# Patient Record
Sex: Female | Born: 1970 | Race: Black or African American | Hispanic: No | Marital: Single | Smoking: Current every day smoker
Health system: Southern US, Community
[De-identification: ages and names within clinical notes are randomized; demographics above are authoritative.]

## PROBLEM LIST (undated history)

## (undated) DIAGNOSIS — F419 Anxiety disorder, unspecified: Secondary | ICD-10-CM

## (undated) DIAGNOSIS — K759 Inflammatory liver disease, unspecified: Secondary | ICD-10-CM

## (undated) DIAGNOSIS — F32A Depression, unspecified: Secondary | ICD-10-CM

## (undated) DIAGNOSIS — F329 Major depressive disorder, single episode, unspecified: Secondary | ICD-10-CM

## (undated) DIAGNOSIS — I10 Essential (primary) hypertension: Secondary | ICD-10-CM

---

## 2002-07-19 ENCOUNTER — Encounter: Payer: Self-pay | Admitting: Emergency Medicine

## 2002-07-19 ENCOUNTER — Emergency Department (HOSPITAL_COMMUNITY): Admission: EM | Admit: 2002-07-19 | Discharge: 2002-07-19 | Payer: Self-pay | Admitting: Emergency Medicine

## 2003-05-31 ENCOUNTER — Inpatient Hospital Stay (HOSPITAL_COMMUNITY): Admission: AD | Admit: 2003-05-31 | Discharge: 2003-06-02 | Payer: Self-pay | Admitting: Psychiatry

## 2004-01-17 ENCOUNTER — Inpatient Hospital Stay (HOSPITAL_COMMUNITY): Admission: RE | Admit: 2004-01-17 | Discharge: 2004-01-22 | Payer: Self-pay | Admitting: Psychiatry

## 2004-04-20 ENCOUNTER — Inpatient Hospital Stay (HOSPITAL_COMMUNITY): Admission: RE | Admit: 2004-04-20 | Discharge: 2004-04-28 | Payer: Self-pay | Admitting: Psychiatry

## 2004-04-20 ENCOUNTER — Ambulatory Visit: Payer: Self-pay | Admitting: Psychiatry

## 2004-05-25 ENCOUNTER — Emergency Department (HOSPITAL_COMMUNITY): Admission: EM | Admit: 2004-05-25 | Discharge: 2004-05-25 | Payer: Self-pay | Admitting: Emergency Medicine

## 2005-01-07 ENCOUNTER — Ambulatory Visit: Payer: Self-pay | Admitting: Psychiatry

## 2005-01-07 ENCOUNTER — Inpatient Hospital Stay (HOSPITAL_COMMUNITY): Admission: EM | Admit: 2005-01-07 | Discharge: 2005-01-13 | Payer: Self-pay | Admitting: Psychiatry

## 2005-02-26 ENCOUNTER — Inpatient Hospital Stay (HOSPITAL_COMMUNITY): Admission: AD | Admit: 2005-02-26 | Discharge: 2005-03-01 | Payer: Self-pay | Admitting: Psychiatry

## 2005-02-26 ENCOUNTER — Ambulatory Visit: Payer: Self-pay | Admitting: Psychiatry

## 2005-06-12 ENCOUNTER — Emergency Department (HOSPITAL_COMMUNITY): Admission: EM | Admit: 2005-06-12 | Discharge: 2005-06-12 | Payer: Self-pay | Admitting: Emergency Medicine

## 2005-11-20 ENCOUNTER — Emergency Department (HOSPITAL_COMMUNITY): Admission: EM | Admit: 2005-11-20 | Discharge: 2005-11-20 | Payer: Self-pay | Admitting: Emergency Medicine

## 2005-12-27 ENCOUNTER — Encounter: Payer: Self-pay | Admitting: *Deleted

## 2005-12-28 ENCOUNTER — Ambulatory Visit: Payer: Self-pay | Admitting: Psychiatry

## 2005-12-28 ENCOUNTER — Inpatient Hospital Stay (HOSPITAL_COMMUNITY): Admission: EM | Admit: 2005-12-28 | Discharge: 2006-01-02 | Payer: Self-pay | Admitting: Psychiatry

## 2006-01-20 ENCOUNTER — Emergency Department (HOSPITAL_COMMUNITY): Admission: EM | Admit: 2006-01-20 | Discharge: 2006-01-20 | Payer: Self-pay | Admitting: Emergency Medicine

## 2006-03-05 ENCOUNTER — Emergency Department (HOSPITAL_COMMUNITY): Admission: EM | Admit: 2006-03-05 | Discharge: 2006-03-06 | Payer: Self-pay | Admitting: Emergency Medicine

## 2006-12-09 ENCOUNTER — Inpatient Hospital Stay (HOSPITAL_COMMUNITY): Admission: AD | Admit: 2006-12-09 | Discharge: 2006-12-14 | Payer: Self-pay | Admitting: Psychiatry

## 2006-12-09 ENCOUNTER — Ambulatory Visit: Payer: Self-pay | Admitting: Psychiatry

## 2006-12-31 ENCOUNTER — Encounter: Payer: Self-pay | Admitting: Emergency Medicine

## 2007-01-01 ENCOUNTER — Inpatient Hospital Stay (HOSPITAL_COMMUNITY): Admission: AD | Admit: 2007-01-01 | Discharge: 2007-01-05 | Payer: Self-pay | Admitting: Psychiatry

## 2007-07-09 ENCOUNTER — Emergency Department (HOSPITAL_COMMUNITY): Admission: EM | Admit: 2007-07-09 | Discharge: 2007-07-09 | Payer: Self-pay | Admitting: Emergency Medicine

## 2007-07-11 ENCOUNTER — Encounter: Payer: Self-pay | Admitting: Emergency Medicine

## 2007-07-11 ENCOUNTER — Ambulatory Visit: Payer: Self-pay | Admitting: Psychiatry

## 2007-07-11 ENCOUNTER — Inpatient Hospital Stay (HOSPITAL_COMMUNITY): Admission: RE | Admit: 2007-07-11 | Discharge: 2007-07-13 | Payer: Self-pay | Admitting: Psychiatry

## 2007-12-09 ENCOUNTER — Emergency Department (HOSPITAL_COMMUNITY): Admission: EM | Admit: 2007-12-09 | Discharge: 2007-12-09 | Payer: Self-pay | Admitting: Emergency Medicine

## 2008-02-17 ENCOUNTER — Emergency Department (HOSPITAL_COMMUNITY): Admission: EM | Admit: 2008-02-17 | Discharge: 2008-02-18 | Payer: Self-pay | Admitting: Emergency Medicine

## 2008-05-20 ENCOUNTER — Inpatient Hospital Stay (HOSPITAL_COMMUNITY): Admission: EM | Admit: 2008-05-20 | Discharge: 2008-05-23 | Payer: Self-pay | Admitting: Emergency Medicine

## 2008-05-23 ENCOUNTER — Inpatient Hospital Stay (HOSPITAL_COMMUNITY): Admission: AD | Admit: 2008-05-23 | Discharge: 2008-05-26 | Payer: Self-pay | Admitting: Psychiatry

## 2008-05-23 ENCOUNTER — Ambulatory Visit: Payer: Self-pay | Admitting: Psychiatry

## 2008-06-13 ENCOUNTER — Other Ambulatory Visit: Payer: Self-pay | Admitting: Emergency Medicine

## 2008-06-13 ENCOUNTER — Other Ambulatory Visit: Payer: Self-pay

## 2008-06-13 ENCOUNTER — Inpatient Hospital Stay (HOSPITAL_COMMUNITY): Admission: RE | Admit: 2008-06-13 | Discharge: 2008-06-24 | Payer: Self-pay | Admitting: Psychiatry

## 2008-12-02 ENCOUNTER — Inpatient Hospital Stay (HOSPITAL_COMMUNITY): Admission: RE | Admit: 2008-12-02 | Discharge: 2008-12-12 | Payer: Self-pay | Admitting: *Deleted

## 2008-12-02 ENCOUNTER — Ambulatory Visit: Payer: Self-pay | Admitting: *Deleted

## 2009-02-06 ENCOUNTER — Emergency Department (HOSPITAL_COMMUNITY): Admission: EM | Admit: 2009-02-06 | Discharge: 2009-02-06 | Payer: Self-pay | Admitting: Emergency Medicine

## 2009-04-22 ENCOUNTER — Emergency Department (HOSPITAL_COMMUNITY): Admission: EM | Admit: 2009-04-22 | Discharge: 2009-04-22 | Payer: Self-pay | Admitting: Emergency Medicine

## 2010-05-10 ENCOUNTER — Emergency Department (HOSPITAL_COMMUNITY)
Admission: EM | Admit: 2010-05-10 | Discharge: 2010-05-12 | Disposition: A | Discharge status: Other Acute Inpatient Hospital | Payer: Self-pay | Source: Home / Self Care | Admitting: Emergency Medicine

## 2010-06-11 ENCOUNTER — Ambulatory Visit (HOSPITAL_COMMUNITY)
Admission: RE | Admit: 2010-06-11 | Discharge: 2010-06-11 | Payer: Self-pay | Source: Home / Self Care | Admitting: Psychiatry

## 2010-06-11 ENCOUNTER — Emergency Department (HOSPITAL_COMMUNITY): Admission: EM | Admit: 2010-06-11 | Discharge: 2010-06-16 | Payer: Self-pay | Admitting: Emergency Medicine

## 2010-06-12 DIAGNOSIS — F1910 Other psychoactive substance abuse, uncomplicated: Secondary | ICD-10-CM

## 2010-06-16 DIAGNOSIS — F1910 Other psychoactive substance abuse, uncomplicated: Secondary | ICD-10-CM

## 2010-08-11 ENCOUNTER — Ambulatory Visit
Admission: RE | Admit: 2010-08-11 | Discharge: 2010-08-11 | Payer: Self-pay | Source: Home / Self Care | Attending: Infectious Diseases | Admitting: Infectious Diseases

## 2010-08-11 ENCOUNTER — Encounter: Payer: Self-pay | Admitting: Infectious Diseases

## 2010-08-11 DIAGNOSIS — B181 Chronic viral hepatitis B without delta-agent: Secondary | ICD-10-CM | POA: Insufficient documentation

## 2010-08-11 DIAGNOSIS — F319 Bipolar disorder, unspecified: Secondary | ICD-10-CM | POA: Insufficient documentation

## 2010-08-11 LAB — CONVERTED CEMR LAB
AST: 105 units/L — ABNORMAL HIGH (ref 0–37)
Albumin: 4.1 g/dL (ref 3.5–5.2)
Alkaline Phosphatase: 58 units/L (ref 39–117)
BUN: 9 mg/dL (ref 6–23)
Basophils Relative: 1 % (ref 0–1)
Calcium: 9.2 mg/dL (ref 8.4–10.5)
Eosinophils Absolute: 0.3 10*3/uL (ref 0.0–0.7)
Eosinophils Relative: 5 % (ref 0–5)
HCV Ab: NEGATIVE
Hep A Total Ab: NEGATIVE
Hep B Core Total Ab: POSITIVE — AB
Lymphocytes Relative: 34 % (ref 12–46)
MCHC: 32.6 g/dL (ref 30.0–36.0)
MCV: 108.2 fL — ABNORMAL HIGH (ref 78.0–100.0)
Monocytes Absolute: 0.4 10*3/uL (ref 0.1–1.0)
Neutro Abs: 3.3 10*3/uL (ref 1.7–7.7)
Potassium: 4.3 meq/L (ref 3.5–5.3)
RDW: 13.5 % (ref 11.5–15.5)
Total Bilirubin: 0.5 mg/dL (ref 0.3–1.2)
Total Protein: 7.6 g/dL (ref 6.0–8.3)

## 2010-08-24 ENCOUNTER — Ambulatory Visit: Admit: 2010-08-24 | Payer: Self-pay | Admitting: Infectious Diseases

## 2010-08-26 NOTE — Assessment & Plan Note (Signed)
Summary: NEW PT HEP B   CC:  new patient referred for hep b.  History of Present Illness: 40 yo who dx with Hepatitis B while she was hospitalized at [facility]. SHe went there for depression. She is unaware of Hep B risk factors- she has 2 children, uses condoms, no IVDA or rec drugs (except marijuana), no hx of blood txf, does have tattoos.  ROS- no icterus, has noted darkness in urine, no pale or chalky BM, no abd pain, no nose or gum bleeds.   Preventive Screening-Counseling & Management  Alcohol-Tobacco     Alcohol drinks/day: 0     Smoking Status: current     Smoking Cessation Counseling: yes     Smoke Cessation Stage: precontemplative     Packs/Day: <0.25     Year Started: 1988      Drug Use:  yes.     Updated Prior Medication List: NEURONTIN 600 MG TABS (GABAPENTIN) 1 tablet three times daily MELATONIN 3 MG TABS (MELATONIN) 1 tablet at night LITHIUM CARBONATE 300 MG CAPS (LITHIUM CARBONATE) 1 cap by mouth twice daily HALOPERIDOL 2 MG TABS (HALOPERIDOL) take two at bedtime  Current Allergies (reviewed today): No known allergies  Past History:  Past Medical History: Current Problems:  HEPATITIS B CARRIER (ICD-V02.61) BIPOLAR AFFECTIVE DISORDER (ICD-296.80)  Family History: Family History Hypertension, mother with lung CA, paternal grandmother had pelvic cancer  Social History: Current Smoker Alcohol use-no Drug use-yes- marijuana Drug Use:  yes  Review of Systems       no change in wt, no lymphadenopathy   Vital Signs:  Patient profile:   40 year old female Height:      63 inches (160.02 cm) Weight:      179.50 pounds (81.59 kg) BMI:     31.91 Temp:     98.2 degrees F (36.78 degrees C) oral Pulse rate:   71 / minute BP sitting:   97 / 67  (left arm)  Vitals Entered By: Alesia Morin CMA (August 11, 2010 2:23 PM) CC: new patient referred for hep b Is Patient Diabetic? No Pain Assessment Patient in pain? no      Nutritional Status BMI of > 30 =  obese  Does patient need assistance? Functional Status Self care Ambulation Normal   Physical Exam  General:  well-developed, well-nourished, well-hydrated, and overweight-appearing.   Eyes:  pupils equal, pupils round, pupils reactive to light, and no injection.   Mouth:  good dentition, pharynx pink and moist, and no exudates.   Neck:  no masses.   Lungs:  normal respiratory effort and normal breath sounds.   Heart:  normal rate, regular rhythm, and no murmur.   Abdomen:  soft, non-tender, normal bowel sounds, no masses, no hepatomegaly, and no splenomegaly.     Impression & Recommendations:  Problem # 1:  HEPATITIS B CARRIER (ICD-V02.61)  no records are available from her stay at [facility]. Will repeat her hepatitis serologies, hiv, rpr. She recieved flu vax at [facility]. Will have her come back in 2 weeks to eval her test results, assess her need for u/s, bx,  rx.   Orders: T-Comprehensive Metabolic Panel [id number]) T-CBC w/Diff [id number]) T-Hepatitis B Surface Antigen [phone]) T-Hepatitis B Surface Antibody ([id number]) T-Hepatitis B Core Antibody ([id number]) T-Hepatitis C Antibody ([id number]) T-Hepatitis A Antibody ([id number]) T-HIV Ab Confirmatory Test/Western Blot ([id number]) Consultation Level IV (66063)  Medications Added to Medication List This Visit: 1)  Neurontin 600 Mg Tabs (Gabapentin) .[name].. 1 tablet three times daily 2)  Melatonin 3 Mg Tabs (Melatonin) .[name].. 1 tablet at night 3)  Lithium Carbonate 300 Mg Caps (Lithium carbonate) .[name].. 1 cap by mouth twice daily 4)  Haloperidol 2 Mg Tabs (Haloperidol) .... Take two at bedtime

## 2010-09-09 NOTE — Miscellaneous (Signed)
Summary: HIPAA Restrictions  HIPAA Restrictions   Imported By: Florinda Marker 09/01/2010 16:38:56  _____________________________________________________________________  External Attachment:    Type:   Image     Comment:   External Document

## 2010-09-29 ENCOUNTER — Emergency Department (HOSPITAL_COMMUNITY)
Admission: EM | Admit: 2010-09-29 | Discharge: 2010-09-30 | Disposition: A | Discharge status: Other Acute Inpatient Hospital | Payer: Self-pay | Attending: Emergency Medicine | Admitting: Emergency Medicine

## 2010-09-29 ENCOUNTER — Emergency Department (HOSPITAL_COMMUNITY): Payer: Self-pay

## 2010-09-29 DIAGNOSIS — F329 Major depressive disorder, single episode, unspecified: Secondary | ICD-10-CM | POA: Insufficient documentation

## 2010-09-29 DIAGNOSIS — R112 Nausea with vomiting, unspecified: Secondary | ICD-10-CM | POA: Insufficient documentation

## 2010-09-29 DIAGNOSIS — R45851 Suicidal ideations: Secondary | ICD-10-CM | POA: Insufficient documentation

## 2010-09-29 DIAGNOSIS — F3289 Other specified depressive episodes: Secondary | ICD-10-CM | POA: Insufficient documentation

## 2010-09-29 DIAGNOSIS — M549 Dorsalgia, unspecified: Secondary | ICD-10-CM | POA: Insufficient documentation

## 2010-09-29 DIAGNOSIS — F1910 Other psychoactive substance abuse, uncomplicated: Secondary | ICD-10-CM | POA: Insufficient documentation

## 2010-09-29 DIAGNOSIS — Z8619 Personal history of other infectious and parasitic diseases: Secondary | ICD-10-CM | POA: Insufficient documentation

## 2010-09-29 LAB — DIFFERENTIAL
Basophils Relative: 1 % (ref 0–1)
Lymphocytes Relative: 37 % (ref 12–46)

## 2010-09-29 LAB — RAPID URINE DRUG SCREEN, HOSP PERFORMED
Benzodiazepines: POSITIVE — AB
Opiates: POSITIVE — AB
Tetrahydrocannabinol: POSITIVE — AB

## 2010-09-29 LAB — COMPREHENSIVE METABOLIC PANEL
ALT: 126 U/L — ABNORMAL HIGH (ref 0–35)
AST: 103 U/L — ABNORMAL HIGH (ref 0–37)
CO2: 23 mEq/L (ref 19–32)
Calcium: 8.7 mg/dL (ref 8.4–10.5)
Total Bilirubin: 0.9 mg/dL (ref 0.3–1.2)
Total Protein: 6.9 g/dL (ref 6.0–8.3)

## 2010-09-29 LAB — ACETAMINOPHEN LEVEL: Acetaminophen (Tylenol), Serum: <10 ug/mL — ABNORMAL LOW (ref 10–30)

## 2010-09-29 LAB — CBC
HCT: 41.6 % (ref 36.0–46.0)
MCH: 34.5 pg — ABNORMAL HIGH (ref 26.0–34.0)
MCV: 102.5 fL — ABNORMAL HIGH (ref 78.0–100.0)
Platelets: 136 10*3/uL — ABNORMAL LOW (ref 150–400)
RDW: 12.1 % (ref 11.5–15.5)

## 2010-09-29 LAB — TRICYCLICS SCREEN, URINE: TCA Scrn: NOT DETECTED

## 2010-09-30 ENCOUNTER — Inpatient Hospital Stay (HOSPITAL_COMMUNITY)
Admission: AD | Admit: 2010-09-30 | Discharge: 2010-10-08 | DRG: 897 | Disposition: A | Discharge status: Home / Self Care | Payer: PRIVATE HEALTH INSURANCE | Source: Ambulatory Visit | Attending: Psychiatry | Admitting: Psychiatry

## 2010-09-30 DIAGNOSIS — Z5900 Homelessness unspecified: Secondary | ICD-10-CM

## 2010-09-30 DIAGNOSIS — F319 Bipolar disorder, unspecified: Secondary | ICD-10-CM

## 2010-09-30 DIAGNOSIS — G8929 Other chronic pain: Secondary | ICD-10-CM

## 2010-09-30 DIAGNOSIS — F1010 Alcohol abuse, uncomplicated: Secondary | ICD-10-CM

## 2010-09-30 DIAGNOSIS — F603 Borderline personality disorder: Secondary | ICD-10-CM

## 2010-09-30 DIAGNOSIS — F1994 Other psychoactive substance use, unspecified with psychoactive substance-induced mood disorder: Secondary | ICD-10-CM

## 2010-09-30 DIAGNOSIS — F1920 Other psychoactive substance dependence, uncomplicated: Principal | ICD-10-CM

## 2010-09-30 DIAGNOSIS — R45850 Homicidal ideations: Secondary | ICD-10-CM

## 2010-09-30 DIAGNOSIS — Z91199 Patient's noncompliance with other medical treatment and regimen due to unspecified reason: Secondary | ICD-10-CM

## 2010-09-30 DIAGNOSIS — B1910 Unspecified viral hepatitis B without hepatic coma: Secondary | ICD-10-CM

## 2010-09-30 DIAGNOSIS — M549 Dorsalgia, unspecified: Secondary | ICD-10-CM

## 2010-09-30 DIAGNOSIS — Z9119 Patient's noncompliance with other medical treatment and regimen: Secondary | ICD-10-CM

## 2010-10-01 DIAGNOSIS — F1910 Other psychoactive substance abuse, uncomplicated: Secondary | ICD-10-CM

## 2010-10-04 ENCOUNTER — Encounter: Payer: Self-pay | Admitting: *Deleted

## 2010-10-04 LAB — HEPATIC FUNCTION PANEL
ALT: 138 U/L — ABNORMAL HIGH (ref 0–35)
Albumin: 3.2 g/dL — ABNORMAL LOW (ref 3.5–5.2)
Total Protein: 6.5 g/dL (ref 6.0–8.3)

## 2010-10-05 LAB — CBC
HCT: 40.5 % (ref 36.0–46.0)
Hemoglobin: 14.3 g/dL (ref 12.0–15.0)
MCH: 37.9 pg — ABNORMAL HIGH (ref 26.0–34.0)
MCHC: 35.4 g/dL (ref 30.0–36.0)
MCV: 107.1 fL — ABNORMAL HIGH (ref 78.0–100.0)
Platelets: 125 10*3/uL — ABNORMAL LOW (ref 150–400)

## 2010-10-05 LAB — URINALYSIS, ROUTINE W REFLEX MICROSCOPIC
Bilirubin Urine: NEGATIVE
Hgb urine dipstick: NEGATIVE
Ketones, ur: NEGATIVE mg/dL
Nitrite: NEGATIVE
Specific Gravity, Urine: 1.016 (ref 1.005–1.030)
Urobilinogen, UA: 0.2 mg/dL (ref 0.0–1.0)

## 2010-10-05 LAB — RAPID URINE DRUG SCREEN, HOSP PERFORMED
Cocaine: POSITIVE — AB
Opiates: NOT DETECTED

## 2010-10-05 LAB — DIFFERENTIAL
Basophils Absolute: 0 10*3/uL (ref 0.0–0.1)
Eosinophils Absolute: 0.2 10*3/uL (ref 0.0–0.7)
Eosinophils Relative: 4 % (ref 0–5)
Lymphocytes Relative: 42 % (ref 12–46)
Neutro Abs: 2.3 10*3/uL (ref 1.7–7.7)
Neutrophils Relative %: 44 % (ref 43–77)

## 2010-10-05 LAB — POCT PREGNANCY, URINE: Preg Test, Ur: NEGATIVE

## 2010-10-05 LAB — ETHANOL: Alcohol, Ethyl (B): <5 mg/dL (ref 0–10)

## 2010-10-05 LAB — BASIC METABOLIC PANEL: Chloride: 109 mEq/L (ref 96–112)

## 2010-10-06 LAB — URINALYSIS, ROUTINE W REFLEX MICROSCOPIC
Nitrite: NEGATIVE
Protein, ur: NEGATIVE mg/dL
Urobilinogen, UA: 0.2 mg/dL (ref 0.0–1.0)

## 2010-10-06 LAB — HEPATIC FUNCTION PANEL
ALT: 104 U/L — ABNORMAL HIGH (ref 0–35)
AST: 77 U/L — ABNORMAL HIGH (ref 0–37)
Albumin: 3.7 g/dL (ref 3.5–5.2)
Alkaline Phosphatase: 64 U/L (ref 39–117)
Total Bilirubin: 0.7 mg/dL (ref 0.3–1.2)

## 2010-10-06 LAB — CBC
HCT: 40.1 % (ref 36.0–46.0)
Hemoglobin: 13.8 g/dL (ref 12.0–15.0)
RBC: 3.72 MIL/uL — ABNORMAL LOW (ref 3.87–5.11)

## 2010-10-06 LAB — DIFFERENTIAL
Lymphocytes Relative: 35 % (ref 12–46)
Lymphs Abs: 3 10*3/uL (ref 0.7–4.0)
Monocytes Relative: 7 % (ref 3–12)
Neutro Abs: 4.7 10*3/uL (ref 1.7–7.7)
Neutrophils Relative %: 55 % (ref 43–77)

## 2010-10-06 LAB — URINE MICROSCOPIC-ADD ON

## 2010-10-06 LAB — RAPID URINE DRUG SCREEN, HOSP PERFORMED
Amphetamines: NOT DETECTED
Opiates: NOT DETECTED
Tetrahydrocannabinol: NOT DETECTED

## 2010-10-06 LAB — BASIC METABOLIC PANEL
Calcium: 8.8 mg/dL (ref 8.4–10.5)
GFR calc Af Amer: >60 mL/min (ref >60)
GFR calc non Af Amer: >60 mL/min (ref >60)
Potassium: 3.7 mEq/L (ref 3.5–5.1)
Sodium: 139 mEq/L (ref 135–145)

## 2010-10-06 LAB — PREGNANCY, URINE: Preg Test, Ur: NEGATIVE

## 2010-10-06 LAB — LITHIUM LEVEL: Lithium Lvl: <0.25 mEq/L — ABNORMAL LOW (ref 0.80–1.40)

## 2010-10-06 NOTE — H&P (Addendum)
NAMEARIENNE, [name]                 ACCOUNT NO.:  [id number]  MEDICAL RECORD NO.:  [id number]           PATIENT TYPE:  LOCATION:                                 FACILITY:  PHYSICIAN:  Anselm Jungling, MD  DATE OF BIRTH:  01/20/1971  DATE OF ADMISSION: DATE OF DISCHARGE:                      PSYCHIATRIC ADMISSION ASSESSMENT   IDENTIFICATION:  The patient is a 40 year old African American female. She is admitted from the emergency room after presenting to [facility] complaining of vomiting blood and unable to keep anything down.  The patient has been previously hospitalized at [facility] for psychiatric conditions in the past and has been admitted in the past with her last admission being in May of 2010, where she was diagnosed with schizoaffective disorder, bipolar with a history of polysubstance abuse with a borderline personality disorder.  The patient is unable to provide any further psychiatric history.  FAMILY HISTORY:  None.  ALCOHOL AND DRUG HISTORY:  The patient reported a mixed drink and occasional beer.  Says she smokes 2 to 3 blunts a weekend, cocaine-- cannot remember when she used it last, and denies opiates and pills. The patient has a history of noncompliance with medication.  PHYSICAL EXAMINATION IN THE EMERGENCY ROOM:  Unremarkable with the exception of pain over the paraspinous muscles without deformity or point tenderness.  Otherwise the exam was within normal limits.  PERTINENT LABORATORY INFORMATION:  Includes a CBC with a white count of 6.2, red count 4.06, hemoglobin 14, hematocrit 41.6.  Diagnostic x-ray of the acute abdomen with chest was grossly normal.  Comprehensive metabolic profile:  Potassium was low at 3.2, albumin 3.4, SGOT 103, SGPT 126.  Alcohol level was less than 5.  Lithium less than 0.25. Acetaminophen low, less than 10.  Salicylate less than 4.  Urine drug screen positive for opiates, positive for cocaine, positive for  benzos, and positive for marijuana.  X-ray of the abdomen was done and findings were negative abdominal ultrasound. Tricyclic screening was also negative and the patient was given medical clearance and transferred to [facility].  Today on evaluation, the patient is disheveled in appearance, wearing a hospital gown.  Well-developed, well-nourished adult female, not well oriented, sullen in appearance, avoids eye contact.  Depressed mood. Grim affect.  Minimal response to questions.  She has poor orientation to date.  She is off the day of the week, off on the month, but does know that she is in [facility].  She is minimally cooperative with this examiner and not cooperative at all with previous questions by M.D.  She voices auditory hallucinations, multiple voices in her head saying "why did we get ourselves locked up," as well as visual hallucinations, which she prohibits by pulling a blanket over her head when in bed.  Unable to assess her cognitive abilities at this time. The patient is tearful and mood is labile.  Alternates between outrage and anxiety, as well as frustration over missing 5 days out of this week.  The patient does endorse frustration and homicidal ideation towards her mom, saying that she wants to "set her ass on fire"  for not going with her to find out more about her hepatitis diagnosis.  ASSESSMENT:  Axis I:  Polysubstance abuse, opiate, cocaine, marijuana, and alcohol. Axis II:  Borderline personality disorder. Axis III:  Recent diagnosis of hepatitis B, as well as abdominal pain secondary to alcohol intake, bipolar disorder, and hepatitis B, noncompliant with medication, chronic burden of mental illness. Axis IV:  Previous institutionalization at [facility] per the patient in the last month. Axis V:  Current global assessment of functioning 30, past year unable to guess at this point.  Estimated length of stay is 3 to 5 days.  Transfer  to 400 bed.  Meds will be restarted as dictated.  Careful watchful waiting.    ______________________________ Verne Spurr, PA   ______________________________ Anselm Jungling, MD    NM/MEDQ  D:  10/01/2010  T:  10/01/2010  Job:  [id number]  Electronically Signed by Geralyn Flash MD on 10/06/2010 11:29:54 AM Electronically Signed by Verne Spurr  on 11/02/2010 10:01:43 AM

## 2010-10-13 NOTE — Discharge Summary (Signed)
[name]                 ACCOUNT NO.:  [id number]  MEDICAL RECORD NO.:  [id number]           PATIENT TYPE:  I  LOCATION:  0304                          FACILITY:  BH  PHYSICIAN:  Anselm Jungling, MD  DATE OF BIRTH:  08/06/1970  DATE OF ADMISSION:  09/30/2010 DATE OF DISCHARGE:  10/08/2010                              DISCHARGE SUMMARY   IDENTIFYING DATA AND REASON FOR ADMISSION:  This is one of several [facility] admissions for [name], a 40 year old homeless African American female, who presented again for treatment of depression within a context of polysubstance abuse.  Please refer to the admission note for further details pertaining to the symptoms, circumstances, and history that led to her hospitalization.  She was given initial Axis I diagnoses of polysubstance dependence and substance-induced mood disorder.  MEDICAL AND LABORATORY:  The patient was medically and physically assessed by the psychiatric nurse practitioner.  She was in generally good health without any active or chronic medical problems, but she had chronic back pain for which she was given Lidoderm 5% patch.  There were no other significant medical issues.  HOSPITAL COURSE:  The patient was admitted to the Adult Inpatient Psychiatric Service.  She presented as a well-nourished, normally- developed adult female who, although complaining of intermittent auditory hallucinations, was clearly not psychotic.  It was fairly apparent that her hallucinosis was the result of substance abuse, and at the time of admission her urine drug screen was positive for opiates, cocaine, and cannabis.  She came to [place] as a client of [facility] where she had been previously treated with lithium and Neurontin apparently for some form of bipolar disorder, but this diagnosis was strongly in doubt based upon our long-term understanding of this individual as someone who has been perpetually involved in severe  chronic substance abuse.  Nonetheless, she was continued on her usual regimen of lithium carbonate and Seroquel.  She presented as nonpsychotic, but with extremely irritable mood, depressed, and frequently withdrawn.  In fact, it was virtually impossible to get staff to persuade her to get out of bed and come to therapeutic groups and activities for most of the first several days of her hospital stay.  This was the case even though it was indicated to her that this might result in a premature discharge due to nonparticipation.  However, she eventually did begin to attend groups, and when she did so she indicated that she was truly and sincerely desirous of having more in the way of therapeutic supports arranged for herself following her discharge.  She thereafter worked fairly closely with case management and the undersigned towards an aftercare plan that would support her ongoing recovery efforts.  Specifically, she was interested in being referred to a community support team when it was explained to her what such could do for her.  On the ninth hospital day, she appeared appropriate for discharge.  Her mood did improve as had her participation, although it was actually still rather spotty.  Nonetheless, she was not in need of any substance detoxification at that point, and was doing well  on her usual regimen of lithium and Seroquel.  She agreed to the following after-care plan.  AFTER-CARE:  The patient was to follow up with the [employer] Team who was to begin working with patient immediately upon discharge.  She was to receive follow-up medication treatment at [facility].  DISCHARGE MEDICATIONS: 1. Lithium carbonate 300 mg b.i.d. 2. Seroquel XR 100 mg b.i.d. and 400 mg q.h.s. 3. Lidoderm 5% patch to back daily.  DISCHARGE DIAGNOSES:  Axis I:  1. Polysubstance dependence, early remission.  2.  History of bipolar disorder not otherwise  specified. Axis II:  Deferred. Axis III:  Chronic back pain. Axis IV:  Stressors severe. Axis V:  Global assessment of functioning on discharge 50.  Upon discharge the suicide risk assessment was completed with a finding of minimal risk.  Because the patient had on various occasions made remarks indicating ongoing resentment and anger towards her mother, her mother was contacted and advised of this.  This was felt necessary even though the patient was specifically denying any active  intent to harm her mother.In addition, a physician's order was written by the undersigned for case management to notify the [facility] Department of same.     Anselm Jungling, MD     SPB/MEDQ  D:  10/12/2010  T:  10/12/2010  Job:  [id number]  Electronically Signed by Geralyn Flash MD on 10/13/2010 11:22:49 AM

## 2010-10-29 LAB — URINALYSIS, ROUTINE W REFLEX MICROSCOPIC
Glucose, UA: NEGATIVE mg/dL
Protein, ur: NEGATIVE mg/dL
Specific Gravity, Urine: 1.024 (ref 1.005–1.030)

## 2010-10-29 LAB — URINE MICROSCOPIC-ADD ON

## 2010-10-29 LAB — POCT PREGNANCY, URINE: Preg Test, Ur: NEGATIVE

## 2010-10-29 LAB — WET PREP, GENITAL: Trich, Wet Prep: NONE SEEN

## 2010-10-31 LAB — URINE MICROSCOPIC-ADD ON

## 2010-10-31 LAB — URINALYSIS, ROUTINE W REFLEX MICROSCOPIC
Bilirubin Urine: NEGATIVE
Nitrite: NEGATIVE
Specific Gravity, Urine: 1.034 — ABNORMAL HIGH (ref 1.005–1.030)
pH: 6 (ref 5.0–8.0)

## 2010-11-02 LAB — COMPREHENSIVE METABOLIC PANEL
AST: 53 U/L — ABNORMAL HIGH (ref 0–37)
Albumin: 3.1 g/dL — ABNORMAL LOW (ref 3.5–5.2)
BUN: 10 mg/dL (ref 6–23)
Calcium: 8.8 mg/dL (ref 8.4–10.5)
Chloride: 111 mEq/L (ref 96–112)
Creatinine, Ser: 0.96 mg/dL (ref 0.4–1.2)
GFR calc Af Amer: >60 mL/min (ref >60)
Total Bilirubin: 0.4 mg/dL (ref 0.3–1.2)
Total Protein: 6.1 g/dL (ref 6.0–8.3)

## 2010-11-02 LAB — CBC
HCT: 38.9 % (ref 36.0–46.0)
HCT: 39.1 % (ref 36.0–46.0)
Hemoglobin: 14 g/dL (ref 12.0–15.0)
MCHC: 35.8 g/dL (ref 30.0–36.0)
MCV: 105.9 fL — ABNORMAL HIGH (ref 78.0–100.0)
MCV: 106.3 fL — ABNORMAL HIGH (ref 78.0–100.0)
Platelets: 139 10*3/uL — ABNORMAL LOW (ref 150–400)
RBC: 3.69 MIL/uL — ABNORMAL LOW (ref 3.87–5.11)
RDW: 14 % (ref 11.5–15.5)
WBC: 4.9 10*3/uL (ref 4.0–10.5)
WBC: 6 10*3/uL (ref 4.0–10.5)

## 2010-11-02 LAB — DRUGS OF ABUSE SCREEN W/O ALC, ROUTINE URINE
Amphetamine Screen, Ur: NEGATIVE
Barbiturate Quant, Ur: NEGATIVE
Benzodiazepines.: NEGATIVE
Creatinine,U: 122.2 mg/dL
Phencyclidine (PCP): NEGATIVE
Propoxyphene: NEGATIVE

## 2010-11-02 LAB — URINE MICROSCOPIC-ADD ON

## 2010-11-02 LAB — URINALYSIS, ROUTINE W REFLEX MICROSCOPIC
Bilirubin Urine: NEGATIVE
Glucose, UA: NEGATIVE mg/dL
Nitrite: NEGATIVE
Specific Gravity, Urine: 1.024 (ref 1.005–1.030)
pH: 6 (ref 5.0–8.0)

## 2010-11-02 LAB — BASIC METABOLIC PANEL
CO2: 29 mEq/L (ref 19–32)
Chloride: 104 mEq/L (ref 96–112)
Creatinine, Ser: 0.91 mg/dL (ref 0.4–1.2)
GFR calc Af Amer: >60 mL/min (ref >60)
Potassium: 4.1 mEq/L (ref 3.5–5.1)
Sodium: 137 mEq/L (ref 135–145)

## 2010-11-02 LAB — TSH: TSH: 2.535 u[IU]/mL (ref 0.350–4.500)

## 2010-11-02 LAB — PREGNANCY, URINE: Preg Test, Ur: NEGATIVE

## 2010-11-02 LAB — LITHIUM LEVEL: Lithium Lvl: <0.25 mEq/L — ABNORMAL LOW (ref 0.80–1.40)

## 2010-11-02 LAB — GC/CHLAMYDIA PROBE AMP, URINE
Chlamydia, Swab/Urine, PCR: NEGATIVE
GC Probe Amp, Urine: NEGATIVE

## 2010-12-07 NOTE — H&P (Signed)
[name], [name]                 ACCOUNT NO.:  [id number]   MEDICAL RECORD NO.:  [id number]          PATIENT TYPE:  IPS   LOCATION:  0401                          FACILITY:  BH   PHYSICIAN:  Anselm Jungling, MD  DATE OF BIRTH:  April 06, 1971   DATE OF ADMISSION:  05/23/2008  DATE OF DISCHARGE:                       PSYCHIATRIC ADMISSION ASSESSMENT   Apparently the patient is on involuntary commitment.  This is due to the  fact that she overdosed on Tylenol, Seroquel and Doxepin in a suicide  attempt.  She was originally admitted on October 27.  She was stabilized  in the main hospital.  She was seen in consultation by Dr. Jeanie Sewer.  Unfortunately she is still using both cocaine and marijuana on a  frequent basis.  She continues to smoke a pack of cigarettes a day.  She  drinks alcohol only occasionally and her current upset is that her  children, both of whom live with her, have been given over to care with  their father and she is upset with her mother because of this.  Today  she is very angry, irritable, kind of the I'm black and put upon type  of thing.  Absolutely no insight into the fact that her drug use may be  an issue and is refusing to talk to [place].   PAST PSYCHIATRIC HISTORY:  She went to the 10th or 11th grade, she is  not sure.  She states she has never actually married.  She has a 9-year-  old son, a 55 year old daughter.  She currently has a female partner who  helped bring her to the hospital.   FAMILY HISTORY:  She denies any issues mentally but states that she is  the way she is because of her mother.  Does not give any further  details.   ALCOHOL AND DRUG HISTORY:  She frequently uses cocaine, she frequently  uses marijuana.  She also uses alcohol from time to time.  She refuses  to give [place] any other information.   PRIMARY CARE PHYSICIAN:  Dr. Gaynell Face.   MEDICAL PROBLEMS:  She has no known medical problems.   MEDICATIONS:  She has not been compliant but  she was prescribed  Seroquel, Lexapro, Doxepin and some Cipro was recently started to treat  a UTI.   DRUG ALLERGIES:  No known drug allergies.   POSITIVE PHYSICAL FINDINGS:  She was found to have trichomonas.  This  was treated with Flagyl while she was in the hospital.  Her other labs  for STDs are pending and will be followed up on.  Other than that she  had no positive physical findings while in the hospital.  Her vital  signs, the patient refused to let them do it on admission.   MENTAL STATUS EXAM:  She is alert and oriented.  She is casually dressed  in hospital attire.  She appears to be adequately nourished and groomed.  Her speech is not pressured.  Her mood is irritable, labile.  Thought  processes are not totally clear, rational.  They are goal oriented  though.  She  wants to be discharged.  Judgment and insight are poor.  Concentration and memory are intact.  Intelligence is average.  She  denies being suicidal or homicidal.  She denies auditory or visual  hallucinations.   AXIS I:  Polysubstance dependence, bipolar, depressed without psychotic  features, could be substance induced.  AXIS II:  Deferred.  AXIS III:  Status post overdose and trichomonas.  AXIS IV:  Severe.  AXIS V:  30.   PLAN:  To admit for further stabilization and medication adjustment as  indicated.  We will try to set up post discharge therapy.  However, the  patient is not being cooperative.  Estimated length of stay is 3-5 days.      [name], P.A.-C.      Anselm Jungling, MD  Electronically Signed    MD/MEDQ  D:  05/24/2008  T:  05/24/2008  Job:  [id number]

## 2010-12-07 NOTE — H&P (Signed)
[name], [name]                 ACCOUNT NO.:  [id number]   MEDICAL RECORD NO.:  [id number]          PATIENT TYPE:  IPS   LOCATION:  0505                          FACILITY:  BH   PHYSICIAN:  Geoffery Lyons, M.D.      DATE OF BIRTH:  [birth year]   DATE OF ADMISSION:  07/11/2007  DATE OF DISCHARGE:                       PSYCHIATRIC ADMISSION ASSESSMENT   IDENTIFICATION:  40 year old single African American female,voluntary  admission.   HISTORY OF PRESENT ILLNESS:  Patient presented in the emergency room  requesting help with suicidal thoughts.  York Spaniel that she  had become very  depressed in mood and thinking of hurting herself for the past week  since she had stopped her medications.  Has been drinking one to two  beers daily and smoking three to four marijuana joints daily, had made  superficial cuts to her hands and thighs.  Her a urine drug screen was  positive for cocaine and cannabinoids.  She was also verbally agitated  in the emergency room and has been verbally agitated here on the unit,  but no physical aggression.  Has been easily redirectable.  This is a  third admission for this patient this year with a history of  polysubstance abuse and bipolar disorder versus substance induced mood  disorder.   PAST PSYCHIATRIC HISTORY:  The patient is receiving no current  psychiatric care at this time, has been seen at [facility] mental  health in the past.  [facility] admission this year.  Previously here  June 9-13, 2008 abdomen May 17-22, 2008.  She has a history of bipolar  disorder and alcohol abuse and cocaine abuse, and history of prior  suicide attempts by cutting and overdose.   SOCIAL HISTORY:  Single African American female living with family.  She  is unemployed.  No current legal problems.  Plans to return to her home  and is requesting help with counseling, and would like to restart on  medications.   FAMILY HISTORY:  Not available.   ALCOHOL AND DRUG HISTORY:   Noted above.   MEDICAL HISTORY:  The patient is followed by Dr. Francoise Ceo is an  outpatient.  Current medical problems are the current superficial  lacerations.  She has made some on her lower chest, her thighs and on  her hands.  No signs of infection.   MEDICATIONS:  Are none at this time.  She was previously at various  times on  Seroquel 100-150 mg at night, trazodone 150 mg p.o. q.h.s.,  Symmetrel 100 mg p.o. b.i.d. and was apparently prescribed Lamictal but  never filled the prescription, and at one point was on Lexapro 20 mg  daily.   ALLERGIES:  Are none.   PHYSICAL EXAMINATION:  Physical exam was done in the emergency room and  lacerations required no sutures.  This is a short-statured petite  female, 5 feet 3 inches tall, 136 pounds, temperature 99.4,  pulse 68,  respirations 28, blood pressure 125/82.   DIAGNOSTIC STUDIES:  CBC, WBC 7.1, hemoglobin 15.1, hematocrit 42.4,  platelets 149,000.  Chemistry, sodium 137, potassium 4.1, chloride 105,  carbon dioxide 24, BUN six, creatinine 0.88 and random glucose 83,  calcium 9.2.  Cardiac markers were negative in the emergency room.  Alcohol level less than five.  Urine drug screen positive for cannabis  and cannabinoids..   MENTAL STATUS EXAM:  Fully alert female.  Today she has had some verbal  agitation and aggressive speech today with one of our staff but settled  down readily and has been otherwise polite and coherent, helping her  roommate and assisting other patients today.  She is directable, no  physical aggression, feeling somewhat agitated subjectively.  Affect a  bit labile.  Speech normal in pace, tone and production when she settled  down.  Giving a coherent history, but mood is rather irritable, agitated  at times, does not want to participate in a lot of history, would rather  not talk.  Willing to give minimal information today.  Thinking is  logical, coherent, denies suicidal thoughts today, denies any  homicidal  thought, does not appear to be internally stimulated.  Cognition is  intact to person and situation and time frames.   AXIS I:  Substance-induced mood disorder.  EtOH abuse rule out  dependence.  Cocaine abuse.  AXIS II:  Deferred  AXIS III:  Superficial lacerations  AXIS IV:  Deferred.  AXIS V:  Current 44 past year 19.   PLAN:  Is to voluntarily admit the patient with q. 15-minute checks in  place.  We started her on Librium 25 mg q.6 h p.r.n. for any withdrawal  symptoms and have given her Seroquel 50 mg q.6 h p.r.n. for agitation  and 100 mg p.o. q.h.s., enrolled her in our dual diagnosis program.  We  are going to check hepatic enzymes.  She is given [place] a limited history  today but indicates her agreement with the plan.   Estimated length of stay is 5 days      Margaret A. Scott, N.P.      Geoffery Lyons, M.D.  Electronically Signed    MAS/MEDQ  D:  07/12/2007  T:  07/13/2007  Job:  [id number]

## 2010-12-07 NOTE — Consult Note (Signed)
NAMESARIA, HARAN                 ACCOUNT NO.:  [id number]   MEDICAL RECORD NO.:  [id number]          PATIENT TYPE:  INP   LOCATION:  1235                         FACILITY:  [facility]   PHYSICIAN:  Antonietta Breach, M.D.  DATE OF BIRTH:  22-Jul-1971   DATE OF CONSULTATION:  05/21/2008  DATE OF DISCHARGE:                                 CONSULTATION   REASON FOR CONSULTATION:  Suicide attempt, mood instability.   REQUESTING PHYSICIAN:  [facility].   HISTORY OF PRESENT ILLNESS:  [name] is a 40 year old single  female admitted to the [name] on May 20, 2008 due to  an overdose of Tylenol, Seroquel, and doxepin.  She acknowledged that  this was a suicide attempt.  She has been on medication, Seroquel at an  unknown dosage, Lexapro, and doxepin at home.  She has been having  hallucinations.  Her mood is labile, ranging from loud wailing with  crying to suddenly laughing within the same hour.  She is not combative.  She is cooperative with care.  Her memory and orientation function are  intact.   Her symptoms have not responded to general medical care.  Her symptoms  appear to have been associated with increased psychosocial stress in the  relationship with her mother.   PAST PSYCHIATRIC HISTORY:  Ms. [name] does have a history of multiple  psychiatric admissions.  She has been admitted to the [facility] 3 times.  In December 2008 she was admitted there with  suicidal ideation.  She had stopped her psychotropic medications.  She  was using marijuana at a heavy rate, along with cutting herself.  She  also had been using cocaine at that time and was very agitated.  She was  discharged from that hospitalization on Seroquel 200 mg q.h.s.   Ms. [name] does have a history of a previous suicide attempt with an  overdose.   She has a listing of bipolar disorder in review of the past medical  record.   FAMILY PSYCHIATRIC HISTORY:  None  known.   SOCIAL HISTORY:  [name] does continue to use cocaine and frequent  marijuana.  Her urine drug screen was positive for cocaine and THC.  She  does drink occasional alcohol.  She is unemployed.  She has a female  partner living with her, along with 2 children ages 5 and 49.  Currently  the children are with the patient's mother.   PAST MEDICAL HISTORY:  Status post overdose.   ALLERGIES:  No known drug allergies.   MEDICATIONS:  The MAR is reviewed.  She is on Nicoderm 21 mg daily and a  Mucomyst protocol.   LABORATORY DATA:  EKG:  QTC is 460 milliseconds.   WBC 5.2, hemoglobin 13.5, platelet count 141, sodium 139, BUN 7,  creatinine 0.65, glucose 115.  TCA positive, Tylenol was 14.7.   INR, alcohol, Tylenol, magnesium, urine pregnancy test and aspirin all  unremarkable.   REVIEW OF SYSTEMS:  Constitutional, head, eyes, ears, nose, throat,  mouth, neurologic, psychiatric, cardiovascular, respiratory,  gastrointestinal, genitourinary,  skin, musculoskeletal, hematologic,  lymphatic, endocrine, metabolic all unremarkable.   EXAMINATION:  VITAL SIGNS:  Temperature 98.8, pulse 78, respiratory rate  29, blood pressure 132/88, O2 saturation on room air 100%.  GENERAL APPEARANCE:  [name] is a young female sitting up in her  hospital bed with no abnormal involuntary movements.   MENTAL STATUS EXAM:  [name] is alert.  Her eye contact is  intermittent.  Her attention span is slightly decreased.  Concentration  is slightly decreased.  Affect is labile, going from inappropriate  laughter to sudden crying with tears.  Her mood is labile.  She is  oriented correctly to all spheres.  Her memory is grossly intact to  immediate recent and remote.  Her speech is pressured at times.  Thought  process is mildly increased.  It is logical, coherent and goal-directed.  Thought content:  She acknowledges suicide intent.  She also  acknowledges having hallucinations, although there  are none evident  during the exam.  She has no delusions.   Insight is poor, however, her judgment is intact for the need of  inpatient psychiatric care.   ASSESSMENT:  AXIS I:  293.83 mood disorder not otherwise specified  (possible substance-induced factors in addition to what appears to be a  functional bipolar disorder).  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  It is unclear to what degree these have been substance-induced.  Rule out 296.80 bipolar disorder not otherwise specified, mixed.  Polysubstance dependence.  AXIS II:  Deferred.  AXIS III:  See past medical history.  AXIS IV:  Primary support group.  AXIS V:  30.   [name] is at risk to harm herself.  She also is at risk for lethal  self-neglect outside of the hospital given her mental status.   The undersigned provided ego supportive psychotherapy at a low  stimulation level along with basic education.   The patient understands and would like to proceed with psychiatric  admission.   RECOMMENDATIONS:  1. Will defer other psychotropic medications for now except for      Ativan.  Would utilize Ativan 0.5-2 mg p.o. IM or IV q.4 h p.r.n.      severe anxiety or agitation, using caution concerning excessive      sedation or imbalance.  2. Would continue the sitter and suicide precautions.  3. Low-stimulation ego support.  4. Admit to an inpatient psychiatric ward as soon as possible.      Antonietta Breach, M.D.  Electronically Signed     JW/MEDQ  D:  05/21/2008  T:  05/21/2008  Job:  [id number]

## 2010-12-07 NOTE — H&P (Signed)
[name], DONALSON                 ACCOUNT NO.:  [id number]   MEDICAL RECORD NO.:  [id number]          PATIENT TYPE:  IPS   LOCATION:  0507                          [facility]ILITY[facility]  [facility]   PHYSICIAN:  Geoffery Lyons, M.D.      DATE OF BIRTH:  Jul 19, 1971   DATE OF ADMISSION:  01/01/2007  DATE OF DISCHARGE:                       PSYCHIATRIC ADMISSION ASSESSMENT   IDENTIFYING INFORMATION:  This is a 40 year old African-American female.  This is a voluntary admission.   HISTORY OF PRESENT ILLNESS:  This is one of several admissions for this  67 year old mother who reports that she did very well and was able to  abstain from substances after her discharged on May 22 until 2 days ago  when she was confronted with a friend of hers who offered her some  marijuana to smoke.  It was apparently laced with cocaine, per her  report, and patient felt agitated, felt depressed, panicky and feared  that she would hurt herself, and presented here requesting admission.  Because she was also complaining of chest pain at the time, she was sent  to the emergency room and was evaluated where she was found to have no  cardiac problems.  Urine drug screen was positive for cocaine and  marijuana.  Today she reports that she is interested in maintaining  abstinence and is requesting to go back on some of the medications that  we had given her previously, and plans to return home to care for her  children.  Denying any suicidal thoughts today but does admit that she  is quite exhausted, and endorses that she has had suicidal thought.  Denies any hallucinations.  Contracting for safety on the unit.   PAST PSYCHIATRIC HISTORY:  This is the patient's 8th admission since  2004.  She has a history of polysubstance abuse with cocaine, and also  has a history of using marijuana and alcohol.  She has a history of  psychotic reactions in the past with agitation and some dissociative  spells with substance use.  Also has a  history of auditory  hallucinations. She has reported depression in the past related to the  death of her grandmother 3 years ago.  She has been stabilized in the  past on Lexapro with a positive response, Seroquel 300 mg at bedtime and  Tegretol also in the past.  She has been [facility] in the past at  [facility].  Has not kept recent appointments.  She  also has a history of prior hospitalizations in 2001 and 2003 in  [place].  She has a history of suicide attempts; at least one suicide  attempt by overdose.   SOCIAL HISTORY:  African-American female who is single.  Currently  living with her mother.  Has 2 young children at home.  She is  unemployed.  Has a basic education.  No current legal charges.   FAMILY HISTORY:  Remarkable for unspecified mental illness on her  father's side of the family.   ALCOHOL AND DRUG HISTORY:  The patient is known to abuse marijuana,  alcohol and cocaine.  First age of use is unclear and the length of  abstinence in the past is unclear.  She denies any alcohol dependence at  this point, but says that she did drink 1 beer yesterday.   MEDICAL HISTORY:  The patient has been followed in the past by Dr.  Loel Dubonnet, her OB/GYN physician.   CURRENT MEDICAL PROBLEMS:  Noncardiac chest pain which she denies any  symptoms today.   PAST MEDICAL HISTORY:  Urinary tract infections.   CURRENT MEDICATIONS:  None.   DRUG ALLERGIES:  None.   POSITIVE PHYSICAL FINDINGS:  The patient's full physical exam was done  in the emergency room.  It was noted in the record on admission.  She  appears to be her usual state of health.  Vital signs:  Temp 98.3, pulse  92, respirations 16, blood pressure 123/65.  She received Tylenol Extra  Strength in the emergency room for her chest pain.  Physical exam is  noted there.  Generally unremarkable.   DIAGNOSTIC STUDIES:  CBC:  WBC 5.2, hemoglobin 13.6, hematocrit 39.6,  platelets to 168,000, MCV is  106.2.  He sodium 140, potassium 3.6,  chloride 105, BUN 10, creatinine currently pending.  Liver enzymes:  SGOT 51, SGPT 70, alkaline phosphatase 52 and total bilirubin is 0.6.  Urine drug screen positive for cocaine and tetrahydrocannabinoids.  Alcohol level was less than 5.  Her TSH level was 1.809.   MENTAL STATUS EXAM:  Fully alert female, articulate, cooperative.  Somewhat irritable and affect but warms with conversation.  Speech is  normal without pressure.  Adequately articulate, expressing her needs.  Insight limited.  Mood irritable.  Thought processes are remarkable for  her describing some previous passive suicidal ideation yesterday,  denying that today.  Cognition is well preserved.  Impulse control and  judgment within normal limits.  Insight limited.   AXIS I:  Bipolar disorder by history; cocaine abuse, rule out  dependence; alcohol abuse by history.  AXIS II:  Deferred.  AXIS III:  History of noncardiac chest pain, macrocytosis not otherwise  specified with MCV 106.2.  AXIS IV:  Deferred.  AXIS V:  Current 30.   PLANS:  Voluntarily admit the patient with a goal of stabilizing her  mood and alleviating her depression.  We are going to place her on  Librium 25 mg q.6 p.r.n. and to restart her Invega, which she did well  on before and she has requested to be back on these medications that  have previously helped her.  Trazodone 100 mg p.o. nightly, and  Symmetrel 100 mg p.o. b.i.d.  Meanwhile, we are going to consider  further diagnostic studies related to her macrocytosis and elevated  liver enzymes.  Estimated length of stay is 7 days.      Margaret A. Scott, N.P.      Geoffery Lyons, M.D.  Electronically Signed    MAS/MEDQ  D:  01/02/2007  T:  01/02/2007  Job:  [id number]

## 2010-12-07 NOTE — H&P (Signed)
[name], [name]                 ACCOUNT NO.:  [id number]   MEDICAL RECORD NO.:  [id number]          PATIENT TYPE:  IPS   LOCATION:  0402                          [facility]ACILITY:  [facility]   PHYSICIAN:  Jasmine Pang, M.D. DATE OF BIRTH:  1971-01-04   DATE OF ADMISSION:  12/02/2008  DATE OF DISCHARGE:                       PSYCHIATRIC ADMISSION ASSESSMENT   IDENTIFICATION:  This is a 40 year old single African American female,  who was admitted on a voluntary basis on Dec 02, 2008.   HISTORY OF PRESENT ILLNESS:  The patient states she was suicidal and  homicidal with plan and intent.  She reports she has been homeless for  the past month she states her mother took her children down to [place]  and gave them to their father.  She cannot get in touch with their  father.  She states her mother will not tell her anything about her  children.  She has been having thoughts of setting herself on fire and  also setting her mother's house on fire.   PAST PSYCHIATRIC HISTORY:  The patient has been going to the mental  health center since the age of 68.  The last time she was admitted was  when she attempted suicide by overdosing on her pills in October and  November 2009.  She was discharged on June 24, 2008.  She reports she  follows up with the absence of care of community support group.  She  reported she has a diagnosis of bipolar disorder at the [facility] where she was in January 2010, diagnosed her  schizoaffective disorder, bipolar type.  She has been tried on different  medications in the past, but could only remember Lexapro at this point.  She was on benztropine 1 mg b.i.d., lithium 300 mg in the morning and  600 mg at bedtime, Risperdal 2 mg in the morning and 4 mg at bedtime,  Seroquel XR 400 mg in the evening.  She was on these medications until 1  month ago when she stopped.   CHEMICAL DEPENDENCE HISTORY:  The patient reports she first use alcohol  when she  was 40 years old.  She reports beer and liquor were put in her  feeding bottle by her grandmother.  At that time, for her to be able to  sleep.  Over the years, she is grown up using alcohol on a regular  basis.  Last time she used alcohol was on Dec 01, 2008.  She states she  used still she passed out.  Marijuana, she also reported she has been  using marijuana since her teen years and has used it socially and  occasionally on and off.  The last time she has marijuana was on Dec 01, 2008.  She also reported using cocaine, which she states she has been  using on and off all of her life.  She denies use of heroin, ecstasy,  LSD, or acid.  Tobacco:  She reports she started smoking cigarettes when  she was 12 years.  She uses about 1-1/2 pack cigarettes a day.   SOCIAL  HISTORY:  The patient reported she was born and raised in  [place], [place].  She currently lives in [place] by  herself in section 8 housing.  She finished high school.  No college.  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  She has  not had any drop in the past 4-5 years.  She reports she is applying for  disability, but has been denied.  She reports she has never been  married, but has 2 children; a 58-year-old son and a 30 year old.  She  denies any history of sexual or physical abuse.  She also reports that  she has been in jail multiple times.  She reports her parents were never  married, and she was raised by her mother.   FAMILY HISTORY:  The patient reports she does not know the age of her  father.  He did have a history of heroin addiction, alcohol, and other  drug use.  She had also reported her mother is 47 years with a history  of alcohol and drug dependency.  She has a brother, who is in [place], who is 29 years, he also has drugs and alcohol dependency  issues.  She denies any boyfriend at this point.  She reports that her  mother to go to children (a 6-year-old son and  29 year old daughter) to  [place] to live with their father for reasons that are unclear.  She is  wanting to get a lawyer to help reestablish custody of them.  She states  she does not neglect her children.   PAST MEDICAL HISTORY:  Denies any medical problems.   ALLERGIES:  No known medication or food allergies.   CURRENT MEDICATIONS:  All of these last used 1 month ago.  1. Benztropine 1 mg b.i.d.  2. Lithium 300 mg in the morning and 600 mg at bedtime.  3. Risperidone 2 mg in the morning and 4 mg at bedtime.  4. Seroquel XR 400 mg q.p.m.   PAST INJURY:  She denies any history of motor vehicle accident, any  falls, or fractures.   PAST SURGICAL HISTORY:  She reported she has a history of a tubal  ligation in 2000 after her last baby and then a tooth extraction in  April 2009.   PHYSICAL FINDINGS:  A complete physical exam was done by our nurse  practitioner and there were no acute physical or medical problems noted.   ADMISSION LABORATORIES:  TSH was within normal limits at 1.864,  comprehensive metabolic panel was within normal limits except for an  elevated SGOT of 53 (0-37) and an elevated SGPT of 68 (0-35).  Blood  albumin was low at 3.1 (3.5-5.2).  CBC was remarkable for a decreased  RBC count of 3.66 (3.87-5.11).  MCV was elevated at 106.3 (78-100).  Platelet count was 139 (150-400).   ADMISSION MENTAL STATUS EXAM:  The patient was lying in bed when I first  met with her, she had poor eye contact.  Speech was soft and slow.  There was psychomotor retardation.  Mood was depressed and anxious.  She  did not want to talk about her reasons for hospitalization.  She did  state she wanted me to get her lawyer, so she can get her children back.  Affect was irritable and angry.  There was positive suicidal ideation as  per history of present illness, positive homicidal ideation as per  history of present illness.  The thought processes  were logical and goal-  directed.   Thought content involved around wanted to get her children  back.  No evidence of auditory or visual hallucinations.  No evidence of  paranoia or delusions.  Cognitive appeared to be grossly intact.   ADMISSION DIAGNOSES:  Axis I:  Schizoaffective disorder, bipolar type.  Also, history of polysubstance abuse.  Axis II:  Personality disorder, not otherwise specified.  Axis III:  None.  Axis IV:  Severe.  (Loss of custody of her children, conflict with  primary support group, burden of psychosocial issues, burden of  psychiatric disorder and chemical dependence).  Axis V:  Global assessment of functioning was 25 upon admission.  GAF  highest past year was 55-60.   PLAN:  The patient will be admitted to our unit.  She was restarted on  her Risperdal 2 mg now and Risperdal 1 mg q.6 hours p.r.n. agitation and  Ativan 1 mg p.o. q.6 hours, p.r.n. agitation.  She will also be  restarted on lithium carbonate, and she will be involved in unit  therapeutic groups and activities.  She will be on a Red Team due to her  chemical dependence.  She is strongly encouraged to participate in  psychoeducation, psychotherapy, individual, and milieu group therapy.  Anticipated length of stay was 3-5 days.  The patient will be discharged  at that time to stop the community support group that she belongs to.  She will be strongly encouraged to take her medication.      Jasmine Pang, M.D.  Electronically Signed     BHS/MEDQ  D:  12/03/2008  T:  12/04/2008  Job:  [id number]

## 2010-12-07 NOTE — H&P (Signed)
[name], [name]                 ACCOUNT NO.:  [id number]   MEDICAL RECORD NO.:  [id number]         PATIENT TYPE:  LINP   LOCATION:                               FACILITY:  [facility]   PHYSICIAN:  Peggye Pitt, M.D. DATE OF BIRTH:  [birth year]   DATE OF ADMISSION:  05/20/2008  DATE OF DISCHARGE:                              HISTORY & PHYSICAL   PRIMARY CARE PHYSICIAN:  She has none. Hence, she is unassigned to [place].   CHIEF COMPLAINT:  Suicide attempt via overdose on Tylenol, tricyclics,  and SSRIs.   HISTORY OF PRESENT ILLNESS:  [name] is a 40 year old African-American  woman who states she just wanted to die. She started taking pills it  seems around 10 p.m. last night and continued taking them slowly  throughout the night. It is unclear of what she has taken, although she  does note that she took 2 full bottles of Tylenol which equals about 100  pills (her partner who was present at bedside says that the 2 bottles of  Tylenol were brand name; these were Tylenol P.M.). There was also an  empty bottle of Seroquel and half a bottle of doxepin that were found  empty as well at her bedside and brought into the emergency department  by EMS. This is why we were called for admission.   ALLERGIES:  She has no known drug allergies.   PAST MEDICAL HISTORY:  Significant for bipolar disorder with multiple  admissions to [facility] secondary to suicidal ideations.   FAMILY HISTORY:  Noncontributory.   SOCIAL HISTORY:  She does admit to using both cocaine and marijuana on a  frequent basis. She smokes about a pack a day, drinks alcohol only  occasionally. She is currently unemployed. She has 2 children of the age  14 and 83, both of whom live with her, and she has a female partner as  well who is at her bedside currently.   MEDICATIONS:  Unknown doses of the following medications:  1. Seroquel.  2. Lexapro.  3. Doxepin.   REVIEW OF SYSTEMS:  Negative except as per  HPI.   PHYSICAL EXAMINATION:  VITAL SIGNS:  Upon admission, blood pressure  122/76, heart rate 70, respirations 16, O2 saturation 99% on 2 liters  with a temperature of 98.7.  GENERAL:  She is alert, awake, and oriented x3. Appears very depressed  with a flat affect and is very ashamed of what she has done.  HEENT:  Normocephalic, atraumatic. Pupils are equal, round, and reactive  to light and accommodation with intact extraocular movements.  NECK:  Is supple. No JVD. No lymphadenopathy. No bruits. No goiter.  LUNGS:  Are clear to auscultation bilaterally.  HEART:  Regular rate and rhythm with no murmurs, rubs, or gallops  auscultated.  ABDOMEN:  Soft, nontender, nondistended. Positive bowel sounds.  EXTREMITIES:  No edema. Positive pedal pulses.   LABORATORY DATA UPON ADMISSION:  Sodium 141, potassium 3.4, chloride  111, bicarb 25, BUN 9, creatinine 0.77, glucose 99. Total bilirubin 0.7,  alkaline phosphatase 56, AST elevated at 62, ALT elevated  at 86, total  protein 6.7, albumin 3.5. WBCs 5.2, hemoglobin 13.5, platelets 141. PT  of 13.7, INR 1, PTT of 31. A urine pregnancy test was negative and  alcohol level of less than 5. UDS was positive for cocaine and  marijuana. Her Tylenol is 14.7. Salicylate level less than 5. Urinalysis  shows 11-20 WBCs, RBCs 7-10 with few bacteria and some Trichomonas.   ASSESSMENT AND PLAN:  1. Suicide attempt via overdose - mainly Tylenol but also      antipsychotics - mainly Seroquel, tricyclics - mainly doxepin, as      well as SSRIs - mainly Lexapro. She has been given activated      charcoal in the ED. She has been started on an N-acetylcysteine      drip for her Tylenol. Will need to follow her LFTs on a daily      basis. I will recheck her Tylenol level in 2 hours. We will also      follow her PT/INR. For her tricyclic overdose, we will check a      tricyclic level, admit her to step down, and have her monitored on      telemetry. She currently  does not have any QRS prolongation or any      QT prolongation. I will also have psychiatry come and see her and      evaluate her for further treatment of her bipolar disorder and      suicidal ideation.  2. For her hypokalemia, we will replete p.o. and check a level.  3. For her urinalysis, we will start on Cipro b.i.d. and send for a      urine culture.  4. For prophylaxis while in the hospital, we will place on Protonix      for GI prophylaxis and on Lovenox for deep venous thrombosis      prophylaxis.      Peggye Pitt, M.D.  Electronically Signed     EH/MEDQ  D:  05/20/2008  T:  05/20/2008  Job:  [id number]
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
## 2010-12-07 NOTE — Discharge Summary (Signed)
[name], [name]                 ACCOUNT NO.:  [id number]   MEDICAL RECORD NO.:  [id number]          PATIENT TYPE:  INP   LOCATION:  1504                         FACILITY:  [facility]   PHYSICIAN:  Hillery Aldo, M.D.   DATE OF BIRTH:  [birth year]   DATE OF ADMISSION:  05/20/2008  DATE OF DISCHARGE:  05/22/2008                               DISCHARGE SUMMARY   PRIMARY CARE PHYSICIAN:  None.   DISCHARGE DIAGNOSES:  1. Suicide attempt.  2. Polydrug overdose including Tylenol, doxepin, Seroquel and possibly      Benadryl.  3. Mild liver function study abnormalities.  4. Hypokalemia.  5. Trichomoniasis.  6. Mood disorder not otherwise specified.  7. Psychotic disorder not otherwise specified.  8. Polysubstance dependence.   DISCHARGE MEDICATIONS:  1. Nicotine patch 21 mg transdermally daily.  2. Ativan 1-2 mg p.o. or IV q.4 h p.r.n.  3. Zofran 4 mg p.o. q.6 h p.r.n.   CONSULTATIONS:  Antonietta Breach, M.D. of psychiatry.   BRIEF ADMISSION HPI:  The patient is a 40 year old female with a past  medical history of bipolar disorder who presented to the hospital with a  chief complaint of overdose.  The patient was brought to the emergency  department by EMS and was accompanied by her female partner who  corroborated that she had taken a large amount of Tylenol, Seroquel and  several doxepin tablets.  For the full details please see the dictated  report done by Dr. Ardyth Harps.   PROCEDURES AND DIAGNOSTIC STUDIES:  None.   DISCHARGE LABORATORY VALUES:  Sodium was 140, potassium 4.0, chloride  113, bicarb 24, BUN 9, creatinine 0.62, glucose 114, total bilirubin  1.6, alkaline phosphatase 45, AST 48, ALT 66, total protein 5.7, albumin  2.9.  PT was 14.6.  White blood cell count was 4.0, hemoglobin 11.4,  hematocrit 33.5, platelets 123.   HOSPITAL COURSE:  1. Suicide attempt with polydrug overdose:  The patient claimed to      take a large volume of Tylenol tablets, the patient's  Tylenol level      was actually only mildly elevated on initial check and subsequently      decreased to less than 10 on subsequent checks.  Nevertheless, the      patient was started on acetylcysteine drip and maintained on this      for 24 hours.  Once poison control recommended discontinuation of      the drip it was discontinued.  The patient has had only mild      elevation of her liver function studies and no subsequent elevation      of the Tylenol levels.  She was also monitored on telemetry due to      her overdose on tricyclic antidepressant.  She did not develop any      tachyarrhythmias or heart block.  She has had multiple suicide      attempts in the past.  A 24 hour sitter has been prescribed and has      not left the patient's side and she has been evaluated by  psychiatry who does recommend that the patient be committed for      inpatient psychiatric therapy.  At this time, she is medically      stable and awaiting transfer to the psychiatric facility.  2. Mild LFT elevation:  Not significant and would continue to monitor      this routinely.  3. Hypokalemia:  The patient was appropriately repleted.  4. Trichomoniasis:  The patient was given a 2 gram dose of Flagyl.      She was advised that all her sexual partners would need to be      informed of this diagnosis and appropriately treated.  GC and      chlamydia probes were sent off and are pending at the time of this      dictation.   DISPOSITION:  The patient is medically stable and awaiting transfer to a  psychiatric facility.      Hillery Aldo, M.D.  Electronically Signed     CR/MEDQ  D:  05/22/2008  T:  05/22/2008  Job:  [id number]

## 2010-12-10 NOTE — Discharge Summary (Signed)
NAMEDAVINITY, FANARA                 ACCOUNT NO.:  [id number]   MEDICAL RECORD NO.:  [id number]          PATIENT TYPE:  IPS   LOCATION:  0401                          FACILITY:  [facility]   PHYSICIAN:  Anselm Jungling, MD  DATE OF BIRTH:  March 09, 1971   DATE OF ADMISSION:  05/23/2008  DATE OF DISCHARGE:  05/26/2008                               DISCHARGE SUMMARY   IDENTIFYING DATA AND REASON FOR ADMISSION:  The patient is a 40 year old  female who was admitted following a suicide attempt by overdose.  She  had also been abusing drugs, including cocaine.  Please refer to the  admission note for further details pertaining to the symptoms,  circumstances and history that led to her hospitalization.  She was  given an initial Axis I diagnosis of mood disorder NOS and polysubstance  abuse.   MEDICAL AND LABORATORY:  The patient was medically and physically  treated for her overdose in the medical setting, and then once on the  inpatient psychiatric service was medically and physically assessed by  the psychiatric nurse practitioner.  There were no further medical  issues.  She had no acute or chronic medical illnesses.   HOSPITAL COURSE:  The patient was admitted to the adult inpatient  psychiatric service.  She presented as a well-nourished, normally-  developed adult female who was generally pleasant and cooperative, but  on the first day was somewhat angry and irritable with staff, including  Dr. Katrinka Blazing, who provided the initial psychiatric assessment.   By the third hospital day, when the undersigned assumed the patient's  care, she was more reasonable, and stated that she wanted to get on  track.  She stated I react before thinking sometimes.  There's only  me, a single mom, there's always consequences.   ASSETS:  She demonstrated better judgment and insight.  She was clearly  denying suicidal ideation.  She talked of wanting to have a goal of  having her children returned to her custody.   She reported that she had  been involved with a community support team worker names Dedra Skeens and gave  [place] permission to be in touch with that individual.   The patient was offered a family counseling session, but she refused.   On the fifth hospital day, she indicated that she was ready for  discharge.  She appeared to be absent of any suicidal ideation at that  time and agreed to the following aftercare plan.   AFTERCARE:  The patient was to follow-up at [facility] with an appointment on May 29, 2008.   DISCHARGE MEDICATIONS:  1. Seroquel 100 mg q.h.s.  2. Trazodone 100 mg q.h.s.   FOLLOWUP:  The patient was instructed to follow up with Dr. Gaynell Face for  additional medical concerns.  She was instructed to stop taking Lexapro,  doxepin, and Cipro.   DISCHARGE DIAGNOSES:  AXIS I:  Depressive disorder NOS.  AXIS II:  Deferred.  AXIS III:  No acute or chronic illnesses.  AXIS IV:  Stressors severe.  AXIS V:  GAF on discharge 55.  Anselm Jungling, MD  Electronically Signed     SPB/MEDQ  D:  05/29/2008  T:  05/29/2008  Job:  [id number]

## 2010-12-10 NOTE — H&P (Signed)
NAME:  [name], [name]                           ACCOUNT NO.:  [id number]   MEDICAL RECORD NO.:  [id number]                   PATIENT TYPE:  IPS   LOCATION:  0304                                 FACILITY:  [facility]   PHYSICIAN:  Geoffery Lyons, M.D.                   DATE OF BIRTH:  [birth year]   DATE OF ADMISSION:  05/31/2003  DATE OF DISCHARGE:                         PSYCHIATRIC ADMISSION ASSESSMENT   IDENTIFYING DATA:  This is a 40 year old single black female who presented  to the emergency room yesterday in the company of her boyfriend.  He  reported that the patient had earlier ingested a quantity of pills and was  still feeling suicidal.  The patient is drowsy, difficult to arouse, very  irritable, is denying suicidal ideation today but still reports that she has  auditory hallucinations telling her to overdose.  Yesterday, in the  emergency room, she presented some papers that she has in her possession  from a prior hospitalization in [place].  The date on these are January of  2003.  She states she had just moved here.  This is incorrect.  The patient  has been here minimally six months, if not longer.  She is known to work  first and was originally seen over at the [facility].  However, she did  not follow through with treatment over there.  Today, her urine drug screen  is positive for cocaine.  She is also positive for THC.  She has a moderate  urinary tract infection with 11-20 wbc, many bacteria.  Due to her being  sexually active, we will rule out STDs and HIV.  She reports the same story  that I heard her tell at the [facility].  She states that she came up  here and it was probably around Christmas time last year to visit her  grandmother who died unexpectedly.  Since that time, the patient has  reported depression and feels trapped.  She does have two children and  cannot leave.   PAST PSYCHIATRIC HISTORY:  She reports prior hospitalizations at [facility].  She does have paperwork with her from a hospitalization  at [facility].  This was dated August 18, 2001.  At that  time, she was discharged on Depakote ER 500 mg, 2 q.h.s., Remeron 15 mg, 1  q.h.s.  She was also on penicillin, multivitamin with vitamin C and  Ultracet.   FAMILY HISTORY:  She states that her father's side of the family had  behavior issues.   ALCOHOL/DRUG HISTORY:  She denies alcohol.  She states she was just smoking  a little weed with a friend and denies regular use of cocaine.   MEDICAL HISTORY:  Primary care Halley Kincer is Dr. Gaynell Face.  She says she had a  pap smear 3-4 months ago.  She denies any medical problems.  She denies any  prescribed medications for medical illnesses.  It is not apparent that she  has had any prescribed medications for her psychiatric symptoms in months.   ALLERGIES:  No known drug allergies.   PHYSICAL EXAMINATION:  This is a petite black female who is quite irritable  with no untoward physical findings.  She does have numerous tattoos.   MENTAL STATUS EXAM:  She is alert and oriented x 3.  Her appearance is well-  groomed.  Her behavior is somewhat cooperative.  Her speech is monosyllabic  mostly because she is so irritable she does not want to answer.  Her mood is  depressed and irritable.  Her thought processes are clear and rational.  Her  judgment and insight are impaired.  Her intelligence level is average.  Concentration and memory are intact.    DIAGNOSES:   AXIS I:  Bipolar disorder, by history, currently depressed with reported  auditory hallucinations, command in nature to hurt herself.   AXIS II:  Rule out borderline personality disorder.   AXIS III:  None.   AXIS IV:  Severe.   AXIS V:  30.   PLAN:  Admit for safety.  To reestablish medication if appropriate.  To  establish outpatient therapy and follow-up care.  The patient has not  followed through prior to this.  I will get the social  worker to talk to her  and see what we can do to help ensure that she will follow through and we  will go ahead and check her for STDs, HIV and start treatment for UTI.     [name], P.A.-C.               Geoffery Lyons, M.D.    MD/MEDQ  D:  05/31/2003  T:  05/31/2003  Job:  [id number]
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
## 2010-12-10 NOTE — Discharge Summary (Signed)
NAME:  [name], [name]                           ACCOUNT NO.:  [id number]   MEDICAL RECORD NO.:  [id number]                   PATIENT TYPE:  IPS   LOCATION:  0304                                 FACILITY:  BH   PHYSICIAN:  Geoffery Lyons, M.D.                   DATE OF BIRTH:  02/15/1971   DATE OF ADMISSION:  05/31/2003  DATE OF DISCHARGE:  06/02/2003                                 DISCHARGE SUMMARY   CHIEF COMPLAINT AND PRESENT ILLNESS:  This was the first admission to [facility] for this 40 year old black female.  She  presented to the emergency room in the company of her boyfriend.  She had  ingested a quantity of pills.  She was feeling suicidal and drowsy,  difficult to arouse, irritable.  She claimed auditory hallucinations, voices  telling her to overdose.  She apparently presented some paper that she had  in her possession from a prior hospitalization in [place] that was in  January 2003.  She just moved to this area.  She apparently had some other  stressors.  She had been seen at [facility].  She had relapsed on  cocaine.   PAST PSYCHIATRIC HISTORY:  She had been in [facility], [place].  At  that time, she was discharged on Depakote 500 mg two at night, Remeron 15 mg  at night.   SUBSTANCE ABUSE HISTORY:  She denied the use of alcohol.  She claimed she  just smoked a little weed with a friend and denied regular use of cocaine.   PAST MEDICAL HISTORY:  She denied history of any major medical conditions.   MEDICATIONS:  None.   PHYSICAL EXAMINATION:  Physical examination was performed, failed to show  any acute findings.   MENTAL STATUS EXAM:  Mental status exam revealed an alert, cooperative  female.  Mood: Irritable.  Affect: Irritable.  Thought processes were clear  and rational.  Judgment and insight were impaired.  Cognitive: Cognition was  well preserved.   ADMISSION DIAGNOSES:   AXIS I:  1. Mood disorder, not otherwise  specified with psychotic features.  2. Cocaine and marijuana abuse.   AXIS II:  Deferred.   AXIS III:  No diagnosis.   AXIS IV:  Moderate.   AXIS V:  Global assessment of functioning upon admission 30, highest global  assessment of functioning in the last year 60.   LABORATORY DATA:  Thyroid profile was within normal limits.  Drug screen:  Positive for marijuana and cocaine.   HOSPITAL COURSE:  She was admitted and started in intensive individual and  group psychotherapy.  She was given Ambien for sleep.  She was restarted on  Depakote 250 mg twice a day and Risperdal 0.5 mg twice a day.  She was given  Cipro for a urinary tract infection.  Risperdal was discontinued and she was  placed  on Seroquel 50 mg twice a day and 100 mg at night.  She admitted to  being very anxious, dealing with a lot of stress, endorsing auditory  hallucinations that tell her to use drugs, but minimized, used to go to  [employer].  She was initially very reserved, very guarded.  She  tolerated the Seroquel well and claimed that it had improved the auditory  hallucinations.  On November 8, she was in full contact with reality, no  suicidal ideas, no homicidal ideas, insight in terms of the need to abstain.  Financial difficulty was still the number one source of stress but the  baby's father was going to help and that made her feel better.  So, she felt  she was ready to go, she was willing to pursue outpatient treatment.  As she  was not suicidal, no homicidal ideas, and the voices had not recurred, we  discharged to outpatient followup.   DISCHARGE DIAGNOSES:   AXIS I:  1. Mood disorder, not otherwise specified with psychotic features.  2. Alcohol and marijuana abuse.   AXIS II:  No diagnosis.   AXIS III:  No diagnosis.   AXIS IV:  Moderate.   AXIS V:  Global assessment of functioning upon discharge 55.   DISCHARGE MEDICATIONS:  1. Depakote ER 250 mg twice a day.  2. Cipro 500 mg one  twice a day.  3. Seroquel 25 mg two twice a day and 100 mg at night.  4. Ambien 10 mg at bedtime for sleep x 10 days.   FOLLOW UP:  She was to be followed up by [facility].                                               Geoffery Lyons, M.D.    IL/MEDQ  D:  06/25/2003  T:  06/26/2003  Job:  [id number]

## 2010-12-10 NOTE — Discharge Summary (Signed)
[name], [name]                 ACCOUNT NO.:  [id number]   MEDICAL RECORD NO.:  [id number]          PATIENT TYPE:  IPS   LOCATION:  0407                          FACILITY:  BH   PHYSICIAN:  Jeanice Lim, M.D. DATE OF BIRTH:  11-09-1970   DATE OF ADMISSION:  04/20/2004  DATE OF DISCHARGE:  04/28/2004                                 DISCHARGE SUMMARY   IDENTIFYING DATA:  This is a 40 year old, African-American female, single,  voluntarily admitted, who was found by EMS at [place] who reportedly took  an overdose of Lexapro and Seroquel at grandmother's grave site, feeling  there is nothing to live for after she died.  Depressed with agitated  thoughts.  Used cocaine the day prior to this attempt.   PAST PSYCHIATRIC HISTORY:  Followed at the [facility] in the past.  They report an admission to [facility]  last in June 2005 for depression and polysubstance abuse.  History of prior  overdoses.   MEDICATIONS:  Lexapro, Seroquel.   ALLERGIES:  No known drug allergies.   PHYSICAL EXAMINATION:  Physical exam essentially within normal limits.  Neurologically nonfocal.   LABORATORY DATA:  Routine admission labs essentially within normal limits.   MENTAL STATUS EXAM:  Fully alert, pleasant, cooperative, __________ speech  within normal limits.  Mood depressed, irritable.  Minimizes cocaine use and  impact on mood.  Thought processes positive for suicidal ideation,  hopelessness with thoughts of overdosing, but goal-directed.  Cognitively  intact.  Judgment and insight impaired and some thought agitation.   ADMITTING DIAGNOSES:   AXIS I:  1.  Rule out major depressive disorder with psychosis versus cocaine induced      mood disorder with psychotic features versus a bipolar II disorder with      mixed features.  2.  Cocaine and cannabis abuse.   AXIS II:  Deferred.   AXIS III:  History of urinary tract infection.   AXIS IV:  Severe  - Evicted from current housing just prior to admission.   AXIS V:  25/55.   HOSPITAL COURSE:  The patient was admitted and ordered routine p.r.n.  medications and underwent further monitoring.  Was encouraged to participate  in individual, group and milieu therapies.  Was placed on Cipro for UTI.  Placed on safety checks for suicide protection.  Symmetrel for cocaine  cravings.  Seroquel was restarted and other psychotropics resumed.  Patient  was medically evaluated and case management assisted the patient in  addressing psychosocial stressors.  The patient became agitated, crying at  dinner, attempting to scratch self with fork.  Was brought back to the unit  on September 28th.  Hit head against window, apparently responding to  internal stimuli stating Go away!  Go away! and swatting at something  around her head.  The patient required one-to-one for safety due to  unpredictable behavior driven by possible psychotic symptoms.  Was given  Haldol and Ativan IM emergency medications for agitation.  The patient had  some anxiety and was continued on one-to-one appearing disorganized with  voices, guarded, not describing symptoms, but from behavior she is clearly  responding to internal stimulus.  There is gradual progress noted as she  continued to receive medications and one-to-one was continued as indicated  for safety reasons due to degree of psychosis and acting on possible command  hallucinations.  Lexapro was decreased to decrease risk of worsening  psychotic symptoms and Depakote added.  The patient was continued on one-to-  one.  The patient appeared to show some improvement while under one-to-one  and Seroquel was optimized, Risperdal optimized, Depakote discontinued due  to possible side effects and Tegretol started for mood instability along  with trazodone to restore sleep, targeting symptoms and mood lability.  The  patient had complained of having a burning sensation from  Depakote.  The  patient gradually reported a decrease in voices and resolution of psychotic  symptoms.  Thought process became more reality based, goal-directed.  Appropriate behavior with no dangerous ideation or hallucinations; able to  contract for safety and showing marked improvement in judgment and insight.  Tolerating medications without side effects.   DISCHARGE MEDICATIONS:  She was given medication education and discharged  on:  1.  Tegretol XR 200 mg q.a.m. and 200 mg at 9:30.  2.  Risperdal 1 mg one half at 9:30.  3.  Seroquel 200 mg in the morning, 400 at 9:30.  4.  Symmetrel 100 mg twice a day.  5.  Lexapro 10 mg in the morning.   FOLLOW UP:  The patient was to follow-up with the [facility] Friday, October 7th at 10:00 a.m.   DISCHARGE DIAGNOSES:   AXIS I:  1.  Rule out major depressive disorder with psychosis versus cocaine induced      mood disorder with psychotic features versus a bipolar II disorder with      mixed features.  2.  Cocaine and cannabis abuse.   AXIS II:  Deferred.   AXIS III:  History of urinary tract infection.   AXIS IV:  Severe - Evicted from current housing just prior to admission.   AXIS V:  Global Assessment of Functioning on discharge was 55.     [name]   JEM/MEDQ  D:  05/24/2004  T:  05/25/2004  Job:  [id number]
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
## 2010-12-10 NOTE — Discharge Summary (Signed)
[name], MCNEARY                 ACCOUNT NO.:  [id number]   MEDICAL RECORD NO.:  [id number]          PATIENT TYPE:  IPS   LOCATION:  0505                          FACILITY:  BH   PHYSICIAN:  Geoffery Lyons, M.D.      DATE OF BIRTH:  05-14-1971   DATE OF ADMISSION:  07/11/2007  DATE OF DISCHARGE:  07/13/2007                               DISCHARGE SUMMARY   CHIEF COMPLAINT:  __________   This is one of several admissions to [facility] for this  40 year old single African-American medical female sent to the emergency  room requesting help with suicidal thoughts.  She had become very  depressed and thinking of hurting herself for the past week, since she  had stopped her medication and had been drinking one to two beers daily  and smoking three to four marijuana joints daily.  Has many superficial  cuts to her hands and thighs.  Urine drug screen was positive for  cocaine and marijuana.  She was very agitated in the emergency room, has  been agitated in the unit but no physical.  Aggression has been easily  redirectable.   PAST MEDICAL HISTORY:  Third time this year to [facility].  No current outpatient treatment.  Had been [facility] in  the past.  Had been diagnosed with bipolar disorder.   SOCIAL HISTORY:  __________ history of alcohol and cocaine dependent as  well as abuse of marijuana.   MEDICAL HISTORY:  Noncontributory.   MEDICATIONS:  None currently.  She had been on Seroquel 100 -150 at  night, trazodone 150 at night, Symmetrel 100 twice a day.  Was also  prescribed Lamictal, never filled the prescription, and Lexapro 20 mg  per day.  __________  performed some superficial lacerations lower  chest, thighs and hands.  Otherwise no acute findings.   LABORATORY WORKUP:  White blood cells 7.1, hemoglobin 15.1, sodium 137,  potassium 4.1, BUN 6, creatinine 0.88, glucose 83, UDS positive for  marijuana and cocaine.   OBJECTIVE:  The  exam reveals a female, who has been very irritable,  easily agitated, some pressured speech, mood irritable.  Affect labile.  Thought processes logical, coherent and relevant.  Upset with what she  perceived as how she was treated by staff member early on, having a hard  time letting go off that, ruminating, worrying, endorsed suicidal  thoughts but able to contract for safety.  No evidence of delusion.  No  hallucinations.  Cognition well-preserved.  __________  AXIS I: Alcohol, cocaine, marijuana abuse, rule out dependence.  Mood  disorder NOS.  AXIS II:  No diagnosis.  AXIS III:  Superficial lacerations.  AXIS IV: Moderate.  AXIS V: Upon admission 45, had GAF in the last year 60.   COURSE IN THE HOSPITAL:  She was admitted, started individual and group  psychotherapy.  We resumed the Seroquel, which she admits has helped her  before.  As already stated a 40 year old female with a longstanding  history of chemical dependency and a mood disorder, rule out bipolar,  who  claimed that since last time she was here in July 2008, she had done  well, got employed work in the [employer] with [employer] [place] [employer].  Starting in November close to __________  she started  experiencing worsening of her mood, the depression, the irritation with  her medication.  Relapsed on alcohol, cocaine and marijuana, increased  use, out of control.  She was told by her therapist to seek inpatient as  she was evidencing increased, escalating agitation with  suicidal/homicidal ideas.  Stressors - anniversary of the grandmother's  death.  Endorsed that her grandmother raised her.  December 19 she was  having some nightmares, woke up with nightmares, screaming get away  from me. There was anxiety, agitation.  We continued to pursue  treatment with the Seroquel. On  December 19 she requested to go home  after learning that her child care plans had fallen through and did not  want her children to have to go  into a foster care. She denied any  suicidal/homicidal thoughts. She felt she could take it from here.  Was  willing to pursue outpatient treatment and stay on the medication this  time around.  Upon discharge improved,  Improved contact with reality.  No active suicidal/homicidal ideations.  Mood more euthymic.   DISCHARGE DIAGNOSES:  AXIS I: Cocaine, alcohol and marijuana dependence.  Mood disorder NOS.  AXIS II: No diagnosis.  AXIS III:  Superficial lacerations.  AXIS IV: Moderate.  AXIS V:  Upon discharge 55-60.   Discharged on Seroquel 200 at bedtime.  Follow-up through community  resources.      Geoffery Lyons, M.D.  Electronically Signed     IL/MEDQ  D:  07/30/2007  T:  07/31/2007  Job:  [id number]

## 2010-12-10 NOTE — Discharge Summary (Signed)
NAMEGER, [name]                 ACCOUNT NO.:  [id number]   MEDICAL RECORD NO.:  [id number]          PATIENT TYPE:  IPS   LOCATION:  0404                          FACILITY:  BH   PHYSICIAN:  Anselm Jungling, MD  DATE OF BIRTH:  Nov 08, 1970   DATE OF ADMISSION:  06/13/2008  DATE OF DISCHARGE:  06/24/2008                               DISCHARGE SUMMARY   IDENTIFYING DATA/REASON FOR ADMISSION:  The patient is a 40 year old  single African American female who was brought in by her Child psychotherapist.  She had been not using her medication properly and had not been keeping  her outpatient mental health appointment since her previous discharge  from our facility on November 2.  Her children, ages 36 and 48, had  apparently been taken from her, either by family or Department of Social  Services.  In response to this, she became suicidal and expressed  homicidal ideation towards her parents.  Please refer to the admission  note for further details pertaining to the symptoms, circumstances and  history that led to her hospitalization.  She was given an initial Axis  I diagnosis of polysubstance abuse, rule out dependence, based on her  known history of substance abuse, and the findings of urine drug screen  that was positive for cocaine, benzodiazepines, and marijuana.   MEDICAL AND LABORATORY:  The patient was medically and physically  assessed by the psychiatric nurse practitioner.  She appeared to have a  urinary tract infection which was addressed appropriately.  There were  no other significant medical issues.   HOSPITAL COURSE:  The patient was admitted to the adult inpatient  psychiatric service.  She presented as a well-nourished, normally-  developed [place] American female who did not appear to be displaying  any thought disorder or other symptoms of psychosis, although she had  complained of auditory hallucinations.  It was assumed that these were  related to her cocaine abuse.  Her  mood was labile and she was demanding  and irritable throughout the first several days of her hospitalization.   In the past, she had been treated with psychotropic regimen that  included Seroquel and Depakote.  This was restarted.  It did seem to  assist in leveling her labile mood and reducing her irritability.  However, she continued to express a great deal of resentment and blaming  of her parents, around issues pertaining to custody in control of her  children.  She was not able to recognize her own role in these  difficulties.   After 10 days of inpatient treatment, it appeared the patient had  maximized her potential gains from participation, which in her case was  half hearted at best.  It appeared that she had reached a level of  optimization of her medication.  She agreed to the following aftercare  plan.   AFTERCARE:  The patient was to follow-up at [facility]  health, with an appointment with their psychiatrist on June 30, 2008,  at 9:30 a.m.  The patient was also referred to the [facility] for  outpatient  substance abuse support and treatment.   DISCHARGE MEDICATIONS:  1. Seroquel 400 mg q.h.s.  2. Rozerem 8 mg q.h.s.  3. Depakote 500 mg b.i.d.   DISCHARGE DIAGNOSES:  AXIS I:  Polysubstance abuse, chronic, severe, in  early remission.  Mood disorder NOS and rule out substance-induced mood disorder.  AXIS II:  Personality disorder NOS.  AXIS III:  Status post UTI.  AXIS IV:  Stressors severe.  AXIS V:  GAF on discharge 50.      Anselm Jungling, MD  Electronically Signed     SPB/MEDQ  D:  06/25/2008  T:  06/25/2008  Job:  [id number]
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
## 2011-02-25 ENCOUNTER — Emergency Department (HOSPITAL_COMMUNITY)
Admission: EM | Admit: 2011-02-25 | Discharge: 2011-02-25 | Disposition: A | Discharge status: Home / Self Care | Payer: Self-pay | Attending: Emergency Medicine | Admitting: Emergency Medicine

## 2011-02-25 DIAGNOSIS — F319 Bipolar disorder, unspecified: Secondary | ICD-10-CM | POA: Insufficient documentation

## 2011-02-25 DIAGNOSIS — L03317 Cellulitis of buttock: Secondary | ICD-10-CM | POA: Insufficient documentation

## 2011-02-25 DIAGNOSIS — L0231 Cutaneous abscess of buttock: Secondary | ICD-10-CM | POA: Insufficient documentation

## 2011-02-27 ENCOUNTER — Emergency Department (HOSPITAL_COMMUNITY)
Admission: EM | Admit: 2011-02-27 | Discharge: 2011-02-27 | Disposition: A | Discharge status: Home / Self Care | Payer: Self-pay | Attending: Emergency Medicine | Admitting: Emergency Medicine

## 2011-02-27 DIAGNOSIS — L0231 Cutaneous abscess of buttock: Secondary | ICD-10-CM | POA: Insufficient documentation

## 2011-02-27 DIAGNOSIS — IMO0001 Reserved for inherently not codable concepts without codable children: Secondary | ICD-10-CM | POA: Insufficient documentation

## 2011-02-27 DIAGNOSIS — F319 Bipolar disorder, unspecified: Secondary | ICD-10-CM | POA: Insufficient documentation

## 2011-04-20 LAB — COMPREHENSIVE METABOLIC PANEL
Albumin: 3.4 — ABNORMAL LOW
Alkaline Phosphatase: 51
BUN: 7
CO2: 24
Chloride: 108
GFR calc non Af Amer: >60
Glucose, Bld: 93
Potassium: 4.1
Total Bilirubin: 0.7

## 2011-04-20 LAB — CBC
HCT: 35.8 — ABNORMAL LOW
Hemoglobin: 12.3
MCV: 106.1 — ABNORMAL HIGH
RBC: 3.37 — ABNORMAL LOW
WBC: 5.5

## 2011-04-20 LAB — LIPASE, BLOOD: Lipase: 25

## 2011-04-20 LAB — WET PREP, GENITAL

## 2011-04-20 LAB — URINALYSIS, ROUTINE W REFLEX MICROSCOPIC
Bilirubin Urine: NEGATIVE
Ketones, ur: NEGATIVE
Nitrite: NEGATIVE
Urobilinogen, UA: 1

## 2011-04-20 LAB — DIFFERENTIAL
Basophils Absolute: 0
Basophils Relative: 1
Monocytes Absolute: 0.5
Neutro Abs: 2
Neutrophils Relative %: 36 — ABNORMAL LOW

## 2011-04-20 LAB — POCT PREGNANCY, URINE: Operator id: 261601

## 2011-04-20 LAB — GC/CHLAMYDIA PROBE AMP, GENITAL: Chlamydia, DNA Probe: POSITIVE — AB

## 2011-04-25 LAB — COMPREHENSIVE METABOLIC PANEL
ALT: 108 — ABNORMAL HIGH
ALT: 70 — ABNORMAL HIGH
ALT: 86 — ABNORMAL HIGH
AST: 100 — ABNORMAL HIGH
AST: 62 — ABNORMAL HIGH
Albumin: 2.9 — ABNORMAL LOW
Albumin: 3.2 — ABNORMAL LOW
Albumin: 3.5
Alkaline Phosphatase: 45
Alkaline Phosphatase: 50
Alkaline Phosphatase: 56
BUN: 9
BUN: 9
CO2: 22
CO2: 25
Calcium: 8.3 — ABNORMAL LOW
Calcium: 8.5
Calcium: 8.7
Chloride: 107
Chloride: 111
Creatinine, Ser: 0.62
Creatinine, Ser: 0.65
Creatinine, Ser: 0.77
GFR calc Af Amer: >60
GFR calc Af Amer: >60
GFR calc non Af Amer: >60
GFR calc non Af Amer: >60
Glucose, Bld: 115 — ABNORMAL HIGH
Glucose, Bld: 99
Potassium: 3.4 — ABNORMAL LOW
Potassium: 3.7
Potassium: 4
Sodium: 139
Sodium: 141
Total Bilirubin: 0.7
Total Bilirubin: 1.2
Total Protein: 5.7 — ABNORMAL LOW
Total Protein: 6.7

## 2011-04-25 LAB — TRICYCLIC ANTIDEPRESSANT EVALUATION
Clomipramine.: <5
Desemethylclomipramine: <5
Desmethyldoxepin: <5 mcg/L
Doxepin: <5 mcg/L
Imipram+Desipr Total: <5 mcg/L — ABNORMAL LOW (ref 150–300)

## 2011-04-25 LAB — URINALYSIS, ROUTINE W REFLEX MICROSCOPIC
Glucose, UA: 250 — AB
Protein, ur: NEGATIVE
Specific Gravity, Urine: 1.019
Urobilinogen, UA: 1

## 2011-04-25 LAB — PROTIME-INR
INR: 1
INR: 1.1
Prothrombin Time: 14.4

## 2011-04-25 LAB — MAGNESIUM: Magnesium: 1.8

## 2011-04-25 LAB — URINE MICROSCOPIC-ADD ON

## 2011-04-25 LAB — CBC
HCT: 33.5 — ABNORMAL LOW
Hemoglobin: 13.5
Platelets: 123 — ABNORMAL LOW
Platelets: 141 — ABNORMAL LOW
RDW: 13.1
RDW: 13.5

## 2011-04-25 LAB — RAPID URINE DRUG SCREEN, HOSP PERFORMED
Benzodiazepines: NOT DETECTED
Cocaine: POSITIVE — AB
Tetrahydrocannabinol: POSITIVE — AB

## 2011-04-25 LAB — GC/CHLAMYDIA PROBE AMP, URINE
Chlamydia, Swab/Urine, PCR: NEGATIVE
GC Probe Amp, Urine: NEGATIVE

## 2011-04-25 LAB — DIFFERENTIAL
Eosinophils Absolute: 0.2
Lymphs Abs: 1.8
Monocytes Absolute: 0.3
Monocytes Relative: 6
Neutrophils Relative %: 55

## 2011-04-25 LAB — GLUCOSE, CAPILLARY: Glucose-Capillary: 87

## 2011-04-25 LAB — ACETAMINOPHEN LEVEL
Acetaminophen (Tylenol), Serum: 14.7
Acetaminophen (Tylenol), Serum: <10 — ABNORMAL LOW

## 2011-04-25 LAB — ETHANOL: Alcohol, Ethyl (B): <5

## 2011-04-25 LAB — PHOSPHORUS: Phosphorus: 1.4 — ABNORMAL LOW

## 2011-04-25 LAB — TRICYCLICS SCREEN, URINE: TCA Scrn: POSITIVE — AB

## 2011-04-26 LAB — RAPID URINE DRUG SCREEN, HOSP PERFORMED
Cocaine: POSITIVE — AB
Tetrahydrocannabinol: POSITIVE — AB

## 2011-04-26 LAB — DIFFERENTIAL
Basophils Absolute: 0.1
Eosinophils Absolute: 0.3
Lymphocytes Relative: 43
Monocytes Absolute: 0.6
Neutrophils Relative %: 44
Smear Review: ADEQUATE

## 2011-04-26 LAB — CBC
MCHC: 34
MCV: 110.3 — ABNORMAL HIGH
RBC: 3.69 — ABNORMAL LOW

## 2011-04-26 LAB — POCT CARDIAC MARKERS
Myoglobin, poc: 31.1
Troponin i, poc: <0.05

## 2011-04-26 LAB — URINALYSIS, ROUTINE W REFLEX MICROSCOPIC
Bilirubin Urine: NEGATIVE
Glucose, UA: NEGATIVE mg/dL
Glucose, UA: NEGATIVE
Hgb urine dipstick: NEGATIVE
Nitrite: NEGATIVE
Nitrite: NEGATIVE
Protein, ur: 30 mg/dL — AB
Protein, ur: NEGATIVE mg/dL
Specific Gravity, Urine: 1.01 (ref 1.005–1.030)
Specific Gravity, Urine: 1.03
Urobilinogen, UA: 0.2 mg/dL (ref 0.0–1.0)
pH: 6.5

## 2011-04-26 LAB — HEPATIC FUNCTION PANEL
AST: 63 — ABNORMAL HIGH
Albumin: 3.9
Total Bilirubin: 0.7
Total Protein: 7.4

## 2011-04-26 LAB — URINE CULTURE
Colony Count: NO GROWTH
Culture: NO GROWTH

## 2011-04-26 LAB — URINE MICROSCOPIC-ADD ON

## 2011-04-26 LAB — ETHANOL: Alcohol, Ethyl (B): <5

## 2011-04-26 LAB — ACETAMINOPHEN LEVEL: Acetaminophen (Tylenol), Serum: <10 — ABNORMAL LOW

## 2011-04-26 LAB — POCT PREGNANCY, URINE: Preg Test, Ur: NEGATIVE

## 2011-04-26 LAB — POCT I-STAT, CHEM 8
BUN: 9
Hemoglobin: 14.6
Potassium: 3.2 — ABNORMAL LOW
Sodium: 143
TCO2: 27

## 2011-04-26 LAB — GLUCOSE, CAPILLARY: Glucose-Capillary: 86

## 2011-04-29 LAB — I-STAT 8, (EC8 V) (CONVERTED LAB)
BUN: 10
Bicarbonate: 22.8
Glucose, Bld: 85
Hemoglobin: 13.9
pCO2, Ven: 34.8 — ABNORMAL LOW
pH, Ven: 7.424 — ABNORMAL HIGH

## 2011-04-29 LAB — DIFFERENTIAL
Basophils Absolute: 0
Basophils Absolute: 0
Basophils Relative: 1
Eosinophils Absolute: 0.2
Eosinophils Absolute: 0.2
Eosinophils Relative: 3
Eosinophils Relative: 5
Lymphocytes Relative: 32
Lymphs Abs: 2.2
Monocytes Absolute: 0.4
Monocytes Relative: 6
Neutro Abs: 4.2
Neutrophils Relative %: 60

## 2011-04-29 LAB — RAPID URINE DRUG SCREEN, HOSP PERFORMED
Amphetamines: NOT DETECTED
Barbiturates: NOT DETECTED
Benzodiazepines: NOT DETECTED
Cocaine: POSITIVE — AB
Opiates: NOT DETECTED
Tetrahydrocannabinol: POSITIVE — AB

## 2011-04-29 LAB — BASIC METABOLIC PANEL
BUN: 6
CO2: 24
Calcium: 9.2
Chloride: 105
Creatinine, Ser: 0.88
GFR calc Af Amer: >60
GFR calc non Af Amer: >60
Glucose, Bld: 83
Potassium: 4.1
Sodium: 137

## 2011-04-29 LAB — CBC
HCT: 38.4
HCT: 42.4
Hemoglobin: 15.1 — ABNORMAL HIGH
MCHC: 35.7
MCV: 105.4 — ABNORMAL HIGH
MCV: 107 — ABNORMAL HIGH
Platelets: 122 — ABNORMAL LOW
RBC: 4.03
RDW: 12.5

## 2011-04-29 LAB — POCT I-STAT CREATININE
Creatinine, Ser: 1.1
Operator id: 151321

## 2011-04-29 LAB — HEPATIC FUNCTION PANEL
Albumin: 3.6
Total Bilirubin: 0.6
Total Protein: 7.3

## 2011-04-29 LAB — D-DIMER, QUANTITATIVE: D-Dimer, Quant: 1.28 — ABNORMAL HIGH

## 2011-04-29 LAB — POCT CARDIAC MARKERS
CKMB, poc: <1 — ABNORMAL LOW
Myoglobin, poc: 32.1

## 2011-04-29 LAB — PREGNANCY, URINE: Preg Test, Ur: NEGATIVE

## 2011-04-29 LAB — ETHANOL: Alcohol, Ethyl (B): <5

## 2011-05-12 LAB — RAPID URINE DRUG SCREEN, HOSP PERFORMED
Amphetamines: NOT DETECTED
Barbiturates: NOT DETECTED
Benzodiazepines: NOT DETECTED
Cocaine: POSITIVE — AB
Opiates: NOT DETECTED

## 2011-05-12 LAB — CBC
Hemoglobin: 13.6
RBC: 3.73 — ABNORMAL LOW
RDW: 13.3

## 2011-05-12 LAB — HEPATIC FUNCTION PANEL
Albumin: 3.5
Alkaline Phosphatase: 52
Total Protein: 7.2

## 2011-05-12 LAB — I-STAT 8, (EC8 V) (CONVERTED LAB)
BUN: 10
Chloride: 105
HCT: 45
Hemoglobin: 15.3 — ABNORMAL HIGH
Operator id: 277751
Sodium: 140

## 2011-05-12 LAB — RAPID STREP SCREEN (MED CTR MEBANE ONLY): Streptococcus, Group A Screen (Direct): NEGATIVE

## 2011-05-12 LAB — TSH: TSH: 1.809

## 2011-05-20 ENCOUNTER — Emergency Department (HOSPITAL_COMMUNITY)
Admission: EM | Admit: 2011-05-20 | Discharge: 2011-05-27 | Disposition: A | Discharge status: Home / Self Care | Payer: Self-pay | Attending: Internal Medicine | Admitting: Internal Medicine

## 2011-05-20 DIAGNOSIS — Z79899 Other long term (current) drug therapy: Secondary | ICD-10-CM | POA: Insufficient documentation

## 2011-05-20 DIAGNOSIS — N898 Other specified noninflammatory disorders of vagina: Secondary | ICD-10-CM | POA: Insufficient documentation

## 2011-05-20 DIAGNOSIS — F1910 Other psychoactive substance abuse, uncomplicated: Secondary | ICD-10-CM | POA: Insufficient documentation

## 2011-05-20 DIAGNOSIS — Z8619 Personal history of other infectious and parasitic diseases: Secondary | ICD-10-CM | POA: Insufficient documentation

## 2011-05-20 DIAGNOSIS — F489 Nonpsychotic mental disorder, unspecified: Secondary | ICD-10-CM | POA: Insufficient documentation

## 2011-05-20 DIAGNOSIS — F319 Bipolar disorder, unspecified: Secondary | ICD-10-CM | POA: Insufficient documentation

## 2011-05-20 LAB — COMPREHENSIVE METABOLIC PANEL
ALT: 33 U/L (ref 0–35)
Albumin: 3.8 g/dL (ref 3.5–5.2)
Alkaline Phosphatase: 66 U/L (ref 39–117)
BUN: 6 mg/dL (ref 6–23)
Chloride: 104 mEq/L (ref 96–112)
Glucose, Bld: 76 mg/dL (ref 70–99)
Potassium: 3.3 mEq/L — ABNORMAL LOW (ref 3.5–5.1)
Sodium: 138 mEq/L (ref 135–145)
Total Bilirubin: 0.4 mg/dL (ref 0.3–1.2)

## 2011-05-20 LAB — URINALYSIS, ROUTINE W REFLEX MICROSCOPIC
Bilirubin Urine: NEGATIVE
Ketones, ur: NEGATIVE mg/dL
Nitrite: NEGATIVE
Protein, ur: NEGATIVE mg/dL
Urobilinogen, UA: 1 mg/dL (ref 0.0–1.0)

## 2011-05-20 LAB — RAPID URINE DRUG SCREEN, HOSP PERFORMED
Barbiturates: NOT DETECTED
Cocaine: POSITIVE — AB
Opiates: NOT DETECTED

## 2011-05-20 LAB — DIFFERENTIAL
Basophils Absolute: 0 10*3/uL (ref 0.0–0.1)
Basophils Relative: 1 % (ref 0–1)
Eosinophils Relative: 2 % (ref 0–5)
Monocytes Absolute: 0.6 10*3/uL (ref 0.1–1.0)

## 2011-05-20 LAB — CBC
MCHC: 34.5 g/dL (ref 30.0–36.0)
RDW: 13 % (ref 11.5–15.5)

## 2011-05-20 LAB — ETHANOL: Alcohol, Ethyl (B): <11 mg/dL (ref 0–11)

## 2011-05-20 LAB — PREGNANCY, URINE: Preg Test, Ur: NEGATIVE

## 2011-05-26 LAB — WET PREP, GENITAL: Yeast Wet Prep HPF POC: NONE SEEN

## 2011-05-26 LAB — GC/CHLAMYDIA PROBE AMP, GENITAL: GC Probe Amp, Genital: NEGATIVE

## 2011-06-20 ENCOUNTER — Encounter: Payer: Self-pay | Admitting: *Deleted

## 2011-06-20 ENCOUNTER — Emergency Department (HOSPITAL_COMMUNITY)
Admission: EM | Admit: 2011-06-20 | Discharge: 2011-06-20 | Discharge status: Against Medical Advice | Payer: Self-pay | Attending: Emergency Medicine | Admitting: Emergency Medicine

## 2011-06-20 DIAGNOSIS — IMO0001 Reserved for inherently not codable concepts without codable children: Secondary | ICD-10-CM | POA: Insufficient documentation

## 2011-06-20 HISTORY — DX: Essential (primary) hypertension: I10

## 2011-06-20 HISTORY — DX: Inflammatory liver disease, unspecified: K75.9

## 2011-06-20 NOTE — ED Notes (Signed)
She has had generalized body aches  For one week and some nausea and vomiting.

## 2011-06-20 NOTE — ED Notes (Signed)
The pt finished her symptoms with she feels depressed and suicidal

## 2011-07-23 ENCOUNTER — Emergency Department (HOSPITAL_COMMUNITY)
Admission: EM | Admit: 2011-07-23 | Discharge: 2011-07-27 | Disposition: A | Discharge status: Home / Self Care | Payer: Self-pay | Attending: Internal Medicine | Admitting: Internal Medicine

## 2011-07-23 ENCOUNTER — Other Ambulatory Visit: Payer: Self-pay

## 2011-07-23 DIAGNOSIS — R45851 Suicidal ideations: Secondary | ICD-10-CM

## 2011-07-23 DIAGNOSIS — T43502A Poisoning by unspecified antipsychotics and neuroleptics, intentional self-harm, initial encounter: Secondary | ICD-10-CM | POA: Insufficient documentation

## 2011-07-23 DIAGNOSIS — Z79899 Other long term (current) drug therapy: Secondary | ICD-10-CM | POA: Insufficient documentation

## 2011-07-23 DIAGNOSIS — F3289 Other specified depressive episodes: Secondary | ICD-10-CM | POA: Insufficient documentation

## 2011-07-23 DIAGNOSIS — T43501A Poisoning by unspecified antipsychotics and neuroleptics, accidental (unintentional), initial encounter: Secondary | ICD-10-CM | POA: Insufficient documentation

## 2011-07-23 DIAGNOSIS — F329 Major depressive disorder, single episode, unspecified: Secondary | ICD-10-CM | POA: Insufficient documentation

## 2011-07-23 DIAGNOSIS — F17200 Nicotine dependence, unspecified, uncomplicated: Secondary | ICD-10-CM | POA: Insufficient documentation

## 2011-07-23 DIAGNOSIS — F32A Depression, unspecified: Secondary | ICD-10-CM

## 2011-07-23 DIAGNOSIS — T50901A Poisoning by unspecified drugs, medicaments and biological substances, accidental (unintentional), initial encounter: Secondary | ICD-10-CM

## 2011-07-23 LAB — CBC
MCH: 35.1 pg — ABNORMAL HIGH (ref 26.0–34.0)
MCHC: 35.1 g/dL (ref 30.0–36.0)
Platelets: 148 10*3/uL — ABNORMAL LOW (ref 150–400)
RBC: 3.62 MIL/uL — ABNORMAL LOW (ref 3.87–5.11)
RDW: 12.7 % (ref 11.5–15.5)

## 2011-07-23 LAB — POCT I-STAT, CHEM 8
Glucose, Bld: 76 mg/dL (ref 70–99)
HCT: 40 % (ref 36.0–46.0)
Hemoglobin: 13.6 g/dL (ref 12.0–15.0)
Potassium: 3.7 mEq/L (ref 3.5–5.1)
Sodium: 141 mEq/L (ref 135–145)

## 2011-07-23 LAB — BLOOD GAS, ARTERIAL
Bicarbonate: 22 mEq/L (ref 20.0–24.0)
Drawn by: 336861
FIO2: 0.21 %
pH, Arterial: 7.4 (ref 7.350–7.400)
pO2, Arterial: 83.5 mmHg (ref 80.0–100.0)

## 2011-07-23 LAB — DIFFERENTIAL
Basophils Absolute: 0 10*3/uL (ref 0.0–0.1)
Basophils Relative: 0 % (ref 0–1)
Eosinophils Absolute: 0.2 10*3/uL (ref 0.0–0.7)
Eosinophils Relative: 3 % (ref 0–5)
Neutrophils Relative %: 34 % — ABNORMAL LOW (ref 43–77)

## 2011-07-23 LAB — RAPID URINE DRUG SCREEN, HOSP PERFORMED
Cocaine: POSITIVE — AB
Opiates: NOT DETECTED

## 2011-07-23 MED ORDER — SODIUM CHLORIDE 0.9 % IV BOLUS (SEPSIS): 1000.0000 mL | Freq: Once | INTRAVENOUS | Status: DC

## 2011-07-23 MED ORDER — SODIUM CHLORIDE 0.9 % IV BOLUS (SEPSIS)
1000.0000 mL | Freq: Once | INTRAVENOUS | Status: AC
Start: 2011-07-23 — End: 2011-07-24
  Administered 2011-07-23: 1000 mL via INTRAVENOUS

## 2011-07-23 MED ORDER — SODIUM CHLORIDE 0.9 % IV BOLUS (SEPSIS)
1000.0000 mL | Freq: Once | INTRAVENOUS | Status: AC
  Administered 2011-07-23: 1000 mL via INTRAVENOUS

## 2011-07-23 MED ORDER — CHARCOAL ACTIVATED PO LIQD
50.0000 g | Freq: Once | ORAL | Status: AC
  Administered 2011-07-23: 50 g via ORAL
  Filled 2011-07-23: qty 240

## 2011-07-23 NOTE — ED Provider Notes (Signed)
History     CSN: [id number]  Arrival date & time 07/23/11  4696   First MD Initiated Contact with Patient 07/23/11 1923      Chief Complaint  Patient presents with  . Drug Overdose    multiple Latuda taken today    (Consider location/radiation/quality/duration/timing/severity/associated sxs/prior treatment) HPI Pt found by [employer] laying on her g'mother's grave. Pt reports that she took multiple latunda . Written note found by [employer] that said "i feel like death is coming my way."  Patient is a base of questioning.  States she still is suicidal and has intent to harm her self.  Otherwise denies any other physical complaints.  Past Medical History  Diagnosis Date  . Hepatitis   . Hypertension     History reviewed. No pertinent past surgical history.  History reviewed. No pertinent family history.  History  Substance Use Topics  . Smoking status: Current Everyday Smoker  . Smokeless tobacco: Not on file  . Alcohol Use: Yes    OB History    Grav Para Term Preterm Abortions TAB SAB Ect Mult Living                  Review of Systems  Unable to perform ROS: Psychiatric disorder    Allergies  Review of patient's allergies indicates no known allergies.  Home Medications   Current Outpatient Rx  Name Route Sig Dispense Refill  . HYDROXYZINE HCL 50 MG PO TABS Oral Take 50 mg by mouth 3 (three) times daily as needed. For itching.    [name] LITHIUM CARBONATE 300 MG PO CAPS Oral Take 300 mg by mouth 2 (two) times daily.     [name] LURASIDONE HCL 80 MG PO TABS Oral Take 80 mg by mouth daily with breakfast.      . SERTRALINE HCL 50 MG PO TABS Oral Take 50 mg by mouth daily.        BP 86/53  Pulse 75  Temp(Src) 97.8 F (36.6 C) (Oral)  Resp 16  SpO2 99%  Physical Exam  Nursing note and vitals reviewed. Constitutional: She is oriented to person, place, and time. She appears well-developed and well-nourished. No distress.  HENT:  Head: Normocephalic and atraumatic.  Eyes: Pupils  are equal, round, and reactive to light.  Neck: Normal range of motion.  Cardiovascular: Normal rate and intact distal pulses.          Date: 07/23/2011  Rate: 65  Rhythm: normal sinus rhythm  QRS Axis: normal  Intervals: borderline QTc prolongation  ST/T Wave abnormalities: normal  Conduction Disutrbances:none:   Old EKG Reviewed: unchanged     Pulmonary/Chest: No respiratory distress. She has no wheezes. She has no rales.  Abdominal: Normal appearance. She exhibits no distension.  Musculoskeletal: Normal range of motion.  Neurological: She is alert and oriented to person, place, and time. No cranial nerve deficit.  Skin: Skin is warm and dry. No rash noted.  Psychiatric: She expresses inappropriate judgment. She exhibits a depressed mood. She expresses suicidal ideation. She expresses suicidal plans.    ED Course  Procedures (including critical care time)  Labs Reviewed  CBC - Abnormal; Notable for the following:    RBC 3.62 (*)    MCH 35.1 (*)    Platelets 148 (*)    All other components within normal limits  DIFFERENTIAL - Abnormal; Notable for the following:    Neutrophils Relative 34 (*)    Lymphocytes Relative 53 (*)    All other components  within normal limits  URINE RAPID DRUG SCREEN (HOSP PERFORMED) - Abnormal; Notable for the following:    Cocaine POSITIVE (*)    All other components within normal limits  LITHIUM LEVEL - Abnormal; Notable for the following:    Lithium Lvl <0.25 (*) REPEATED TO VERIFY   All other components within normal limits  POCT I-STAT, CHEM 8 - Abnormal; Notable for the following:    Calcium, Ion 1.10 (*)    All other components within normal limits  ETHANOL  BLOOD GAS, ARTERIAL  I-STAT, CHEM 8   No results found.   1. Depression   2. Suicidal thoughts   3. Drug overdose       MDM          Nelia Shi, MD 07/24/11 1120

## 2011-07-23 NOTE — ED Notes (Signed)
Pt found by [employer] laying on her g'mother's grave.  Pt reports that she took multiple latunda .  Written note found by [employer] that said "i feel like deaf is coming my way."

## 2011-07-23 NOTE — ED Notes (Signed)
JXB:[id number]<GN> Expected date:07/23/11<BR> Expected time:<BR> Means of arrival:Ambulance<BR> Comments:<BR> M12 - 40yoF suicidal and OD on poss several meds

## 2011-07-23 NOTE — ED Notes (Signed)
Pt refuses to drink charcoal. MD aware. No further orders received.

## 2011-07-23 NOTE — ED Notes (Signed)
MD at bedside. 

## 2011-07-24 ENCOUNTER — Encounter (HOSPITAL_COMMUNITY): Payer: Self-pay

## 2011-07-24 MED ORDER — LORAZEPAM 1 MG PO TABS
1.0000 mg | ORAL_TABLET | Freq: Once | ORAL | Status: AC
  Administered 2011-07-24: 1 mg via ORAL
  Filled 2011-07-24: qty 1

## 2011-07-24 MED ORDER — NICOTINE 21 MG/24HR TD PT24
21.0000 mg | MEDICATED_PATCH | Freq: Every day | TRANSDERMAL | Status: DC
  Administered 2011-07-24 – 2011-07-27 (×4): 21 mg via TRANSDERMAL
  Filled 2011-07-24 (×3): qty 1

## 2011-07-24 NOTE — Progress Notes (Signed)
Per [facility], patient will be reassessed in am re: low blood pressure.

## 2011-07-24 NOTE — ED Notes (Signed)
ACT team in to assess patient.

## 2011-07-24 NOTE — ED Provider Notes (Addendum)
Pt seen and evaluated in psych ED this morning.  Pt resting comfortably without any current complaints.  Pt has been seen by ACT team and information has been faxed to [facility].    Ethelda Chick, MD 07/24/11 9562  Ethelda Chick, MD 07/26/11 [phone]
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
## 2011-07-24 NOTE — ED Notes (Signed)
Psych rules/regulations explained to patient.  Pt verbalized understanding.

## 2011-07-24 NOTE — ED Notes (Signed)
Pt. Information faxed to [facility] and [facility] for placement.

## 2011-07-24 NOTE — ED Notes (Signed)
Assessment staff at [facility] notified writer Dr. Payton Spark wants pt to be observed for the next 6 hours due to low blood pressure results. PT's RN notified by Clinical research associate.

## 2011-07-24 NOTE — ED Notes (Signed)
Pt ambulated around ED per MD orders. Pt states she is still a little dizzy. Pt able to ambulate with steady gait, independent. MD aware.

## 2011-07-25 MED ORDER — ZOLPIDEM TARTRATE 5 MG PO TABS
5.0000 mg | ORAL_TABLET | Freq: Every evening | ORAL | Status: DC | PRN
  Administered 2011-07-25 – 2011-07-26 (×2): 5 mg via ORAL
  Filled 2011-07-25 (×2): qty 1

## 2011-07-25 MED ORDER — SERTRALINE HCL 50 MG PO TABS
50.0000 mg | ORAL_TABLET | Freq: Every day | ORAL | Status: DC
  Administered 2011-07-25 – 2011-07-26 (×2): 50 mg via ORAL
  Filled 2011-07-25 (×2): qty 1

## 2011-07-25 MED ORDER — LURASIDONE HCL 80 MG PO TABS
80.0000 mg | ORAL_TABLET | Freq: Every day | ORAL | Status: DC
  Administered 2011-07-26: 80 mg via ORAL
  Filled 2011-07-25: qty 1

## 2011-07-25 MED ORDER — LORAZEPAM 1 MG PO TABS
1.0000 mg | ORAL_TABLET | Freq: Four times a day (QID) | ORAL | Status: DC | PRN
  Administered 2011-07-25 – 2011-07-26 (×3): 1 mg via ORAL
  Filled 2011-07-25 (×3): qty 1

## 2011-07-25 NOTE — ED Provider Notes (Signed)
She was seen at 09:37 hours. She is alert, cooperative. She has eaten breakfast. Blood pressure at 0644 hours was 102/66. Now, after ambulation at 09:57 hours blood pressure is 114/65. She was asymptomatic during ambulation effort. I've informed the psychiatrist that she should be able to placed at this time  Flint Melter, MD 07/25/11 1000

## 2011-07-25 NOTE — Consult Note (Signed)
Patient Identification:  [name] Date of Evaluation:  07/25/2011   History of Present Illness:   40 y.o. female who was brought in by the [employer] for a suicide attempt. She states she did not call the police but someone call them while she was at the graveyard lying on her decease grandmother grave. She reported that she had taken an overdose of pills but her UDS only showed cocaine in her system. The attempt was due to her reaction to her farther telling her that she was worthless and she needed to go ahead and kill her self. She further reports of not having no natural support or close friends or relatives she can confide and trust in. When asked does she see or hear things that other may not be able to hear, she stated that she often her her deceased grandmother tell her everything is going to be all right.  When I asked patient how many pills she took she was unable to tell me. Patient is very logical and goal-directed during the interview does not seem to be hallucinating or delusional. She is alert to recurrent at x3. No abnormal movements present.  I reviewed her last discharge summary and reviewed her medical records patient has number of admissions at behavioral health.   PHYSICIAN: Anselm Jungling, MD DATE OF BIRTH: 1971-04-29  DATE OF ADMISSION: 09/30/2010  DATE OF DISCHARGE: 10/08/2010  DISCHARGE SUMMARY  IDENTIFYING DATA AND REASON FOR ADMISSION: This is one of several [facility]  admissions for [name], a 40 year old homeless African American female,  who presented again for treatment of depression within a context of  polysubstance abuse. Please refer to the admission note for further  details pertaining to the symptoms, circumstances, and history that led  to her hospitalization. She was given initial Axis I diagnoses of  polysubstance dependence and substance-induced mood disorder.  MEDICAL AND LABORATORY: The patient was medically and physically  assessed by the psychiatric  nurse practitioner. She was in generally  good health without any active or chronic medical problems, but she had  chronic back pain for which she was given Lidoderm 5% patch. There were  no other significant medical issues.  HOSPITAL COURSE: The patient was admitted to the Adult Inpatient  Psychiatric Service. She presented as a well-nourished, normally-  developed adult female who, although complaining of intermittent  auditory hallucinations, was clearly not psychotic. It was fairly  apparent that her hallucinosis was the result of substance abuse, and at  the time of admission her urine drug screen was positive for opiates,  cocaine, and cannabis. She came to [place] as a client of [facility] where she had been previously treated with lithium and  Neurontin apparently for some form of bipolar disorder, but this  diagnosis was strongly in doubt based upon our long-term understanding  of this individual as someone who has been perpetually involved in  severe chronic substance abuse.  Nonetheless, she was continued on her usual regimen of lithium carbonate  and Seroquel.  She presented as nonpsychotic, but with extremely irritable mood,  depressed, and frequently withdrawn. In fact, it was virtually  impossible to get staff to persuade her to get out of bed and come to  therapeutic groups and activities for most of the first several days of  her hospital stay. This was the case even though it was indicated to  her that this might result in a premature discharge due to  nonparticipation. However, she eventually  did begin to attend groups,  and when she did so she indicated that she was truly and sincerely  desirous of having more in the way of therapeutic supports arranged for  herself following her discharge. She thereafter worked fairly closely  with case management and the undersigned towards an aftercare plan that  would support her ongoing recovery efforts.    Specifically, she was interested in being referred to a community  support team when it was explained to her what such could do for her.  On the ninth hospital day, she appeared appropriate for discharge. Her  mood did improve as had her participation, although it was actually  still rather spotty. Nonetheless, she was not in need of any substance  detoxification at that point, and was doing well on her usual regimen of  lithium and Seroquel. She agreed to the following after-care plan.   AFTER-CARE: The patient was to follow up with the [employer] Team who was to begin working with patient immediately upon  discharge. She was to receive follow-up medication treatment at  [facility]   DISCHARGE MEDICATIONS:  1. Lithium carbonate 300 mg b.i.d.  2. Seroquel XR 100 mg b.i.d. and 400 mg q.h.s.  3. Lidoderm 5% patch to back daily.   DISCHARGE DIAGNOSES: Axis I: 1. Polysubstance dependence, early  remission. 2. History of bipolar disorder not otherwise specified.  Axis II: Deferred.  Axis III: Chronic back pain.  Axis IV: Stressors severe.  Axis V: Global assessment of functioning on discharge 50.   Upon discharge the suicide risk assessment was completed with a finding  of minimal risk.  Because the patient had on various occasions made remarks indicating ongoing resentment and anger towards her mother, her mother was contacted and advised of this. This was felt necessary even though the patient was specifically denying any active  intent to harm her mother.In addition, a physician's order was written by the undersigned for case management to notify the [facility] Department of same.     Past Medical History:     Past Medical History  Diagnosis Date  . Hepatitis   . Hypertension       History reviewed. No pertinent past surgical history.  Filed Vitals:   07/25/11 0957  BP: 114/65  Pulse: 74  Temp:   Resp:     Lab  Results:   BMET    Component Value Date/Time   NA 141 07/23/2011 2215   K 3.7 07/23/2011 2215   CL 109 07/23/2011 2215   CO2 24 05/20/2011 1050   GLUCOSE 76 07/23/2011 2215   BUN 6 07/23/2011 2215   CREATININE 0.80 07/23/2011 2215   CALCIUM 9.3 05/20/2011 1050   GFRNONAA >90 05/20/2011 1050   GFRAA >90 05/20/2011 1050    Allergies: No Known Allergies  Current Medications:  Prior to Admission medications   Medication Sig Start Date End Date Taking? Authorizing Provider  hydrOXYzine (ATARAX/VISTARIL) 50 MG tablet Take 50 mg by mouth 3 (three) times daily as needed. For itching.   Yes Historical Provider, MD  lithium 300 MG capsule Take 300 mg by mouth 2 (two) times daily.    Yes Historical Provider, MD  lurasidone (LATUDA) 80 MG TABS Take 80 mg by mouth daily with breakfast.     Yes Historical Provider, MD  sertraline (ZOLOFT) 50 MG tablet Take 50 mg by mouth daily.     Yes Historical Provider, MD    Social History:  reports that she has been smoking.  She does not have any smokeless tobacco history on file. She reports that she drinks alcohol. Her drug history not on file.   Family History:    History reviewed. No pertinent family history.   DIAGNOSIS:   AXIS I  polysubstance dependence, bipolar disorder   AXIS II  Deffered  AXIS III See medical notes.  AXIS IV  homeless   AXIS V 40     Recommendations:  Patient started on Latuda and Zoloft. Patient needs further observation in the ER.   Eulogio Ditch, MD

## 2011-07-26 MED ORDER — FLUOXETINE HCL 20 MG PO CAPS
20.0000 mg | ORAL_CAPSULE | Freq: Every day | ORAL | Status: DC
  Administered 2011-07-26 – 2011-07-27 (×2): 20 mg via ORAL
  Filled 2011-07-26 (×2): qty 1

## 2011-07-26 MED ORDER — HALOPERIDOL 5 MG PO TABS
5.0000 mg | ORAL_TABLET | Freq: Every day | ORAL | Status: DC
  Administered 2011-07-26: 5 mg via ORAL
  Filled 2011-07-26: qty 1

## 2011-07-26 NOTE — BH Assessment (Signed)
[facility] referral needed, per Dr. Rogers Blocker. He will continue to re-evaluate patient daily to determine appropriate level of care.

## 2011-07-26 NOTE — ED Notes (Signed)
Care assumed pt lying in bed resting

## 2011-07-26 NOTE — ED Provider Notes (Signed)
Pt seen and evaluated in psych ED.  She is calm and cooperative this morning.  Pt has been assesed by ACT team and has been declined at [facility]- [facility]  Ethelda Chick, MD 07/26/11 [phone]

## 2011-07-26 NOTE — ED Notes (Signed)
Pt to window to request food/something to drink, affect/posture angry, pt advised that food/drink would be brought to her in her room, pt continued to stand at window, pt asked to wait in room again d/t writer needing to come in and speak with her/administer her HS medication, pt stormed to room angry/mumbling words under her breath. Writer in room with food, drink, med, pt sitting on bed affect remains angry, pt vague/guarded when pt attempts to engage in conversations for assessment, pt actively expressing SI/HI/AH, states "I feel like that all time", would not give any further information with many attempts from writer to engage. Pt has multiple containers of fruit cocktail, pt states she is going to eat them, pt advised that if she is not going to eat them, she needs to throw them away.

## 2011-07-26 NOTE — ED Notes (Signed)
Pt out of room affect continues to be angry requesting medication to help her sleep, speech pressured, pt advised writer will bring medication to room, pt stormed back to room cursing, what pt said difficult to understand.

## 2011-07-26 NOTE — ED Notes (Signed)
Pt out of room requesting to take a shower, pt informed that opportunity to shower are after breakfast in am, pt angry and went back to room

## 2011-07-26 NOTE — ED Notes (Signed)
Up to the bathroom 

## 2011-07-26 NOTE — Consult Note (Signed)
  [name] is a 41 y.o. female [id number] Oct 09, 1970  Subjective/Objective:  Patient is calm cooperative during the interview she denies having suicidal ideations she is logical and goal-directed denies hating voices. I discussed about her medications as she has no insurance it will be difficult for her to get LATUDA. She admitted to be started on Haldol as it will be easy to afford this medication.  Filed Vitals:   07/26/11 1217  BP: 95/61  Pulse: 72  Temp:   Resp: 18    Lab Results:   BMET    Component Value Date/Time   NA 141 07/23/2011 2215   K 3.7 07/23/2011 2215   CL 109 07/23/2011 2215   CO2 24 05/20/2011 1050   GLUCOSE 76 07/23/2011 2215   BUN 6 07/23/2011 2215   CREATININE 0.80 07/23/2011 2215   CALCIUM 9.3 05/20/2011 1050   GFRNONAA >90 05/20/2011 1050   GFRAA >90 05/20/2011 1050    Medications:  Scheduled:     . lurasidone  80 mg Oral Q breakfast  . nicotine  21 mg Transdermal Daily  . sertraline  50 mg Oral Daily     PRN Meds LORazepam, zolpidem  Assessment/Plan:  Discontinued LATUDA and started her on Haldol 5 mg at bedtime. Discontinued Zoloft and started her on Prozac 20 mg by mouth daily AHLUWALIA,SHAMSHER S MD 07/26/2011

## 2011-07-26 NOTE — ED Notes (Signed)
Writer in room to administer sleep medication/ativan for agitation, pt asks "why I can't take a shower", pt advised that the rules on the unit are that showers are taken in am, pt states "they let me take one last night", pt advised that writer was not here last night and the rules are that showers be taken in the am, pt allowed administration of medication, affect continues to be angry and speech continues to be pressured.

## 2011-07-26 NOTE — ED Notes (Signed)
Dr Jenel Lucks into see

## 2011-07-27 DIAGNOSIS — F329 Major depressive disorder, single episode, unspecified: Secondary | ICD-10-CM

## 2011-07-27 MED ORDER — DIPHENHYDRAMINE HCL 25 MG PO TABS: 25.0000 mg | ORAL_TABLET | Freq: Every day | ORAL | Status: AC

## 2011-07-27 MED ORDER — FLUOXETINE HCL 20 MG PO CAPS: 20.0000 mg | ORAL_CAPSULE | Freq: Every day | ORAL | Status: DC

## 2011-07-27 MED ORDER — HALOPERIDOL 5 MG PO TABS: 5.0000 mg | ORAL_TABLET | Freq: Every day | ORAL | Status: AC

## 2011-07-27 NOTE — ED Notes (Signed)
Pt up OOB to bathroom.

## 2011-07-27 NOTE — Consult Note (Addendum)
  [name] is a 41 y.o. female [id number] 12-20-1970  Subjective/Objective:  Patient is feeling much better. She denies having suicidal or homicidal ideations. She's not hallucinating or delusional. She is alert awake oriented x3. Attention concentration good. Abstraction ability good. Insight and judgment improved. I discussed with with her about her drug abuse and her medications [place] and [place] her medication from the there.  I asked her about her family support to speak with them but patient at this time doesn't want to involve her family she said I don't have much contact with them.  Filed Vitals:   07/27/11 0614  BP: 101/44  Pulse: 72  Temp: 98.2 F (36.8 C)  Resp: 14    Lab Results:   BMET    Component Value Date/Time   NA 141 07/23/2011 2215   K 3.7 07/23/2011 2215   CL 109 07/23/2011 2215   CO2 24 05/20/2011 1050   GLUCOSE 76 07/23/2011 2215   BUN 6 07/23/2011 2215   CREATININE 0.80 07/23/2011 2215   CALCIUM 9.3 05/20/2011 1050   GFRNONAA >90 05/20/2011 1050   GFRAA >90 05/20/2011 1050    Medications:  Scheduled:     . FLUoxetine  20 mg Oral Daily  . haloperidol  5 mg Oral Q2000  . nicotine  21 mg Transdermal Daily  . DISCONTD: lurasidone  80 mg Oral Q breakfast  . DISCONTD: sertraline  50 mg Oral Daily     PRN Meds LORazepam, zolpidem  Assessment/Plan:  Patient can be discharged to followup in the outpatient setting.  AHLUWALIA,SHAMSHER S MD 07/27/2011
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
## 2011-07-27 NOTE — ED Provider Notes (Addendum)
Vitals stable.  Medically cleared.  Pt is on [facility] wait list.  Pt is here voluntarily for SI.    Gavin Pound. Meegan Shanafelt, MD 07/27/11 1308      11:38 AM Dr. Rogers Blocker has seen pt and cleared the patient for discharge to home. Pt has signed Engineer, manufacturing systems.  Pt referred to outpt.    Gavin Pound. Annalena Piatt, MD 07/27/11 1140

## 2011-07-27 NOTE — ED Notes (Signed)
TC to [facility] and informed no beds per [place].  TC to [facility] and spoke with Robin, who stated they had no beds today.  TC to [facility] & informed no beds currently, but possibly after 1:00pm after discharges.  TC to [facility] & spoke with Michelle who stated they do have beds. TC to [facility] & spoke with Kathy who stated they have beds today.  

## 2011-10-03 ENCOUNTER — Encounter (HOSPITAL_COMMUNITY): Payer: Self-pay | Admitting: *Deleted

## 2011-10-03 ENCOUNTER — Emergency Department (HOSPITAL_COMMUNITY)
Admission: EM | Admit: 2011-10-03 | Discharge: 2011-10-05 | Disposition: A | Discharge status: Home / Self Care | Payer: Self-pay | Attending: Emergency Medicine | Admitting: Emergency Medicine

## 2011-10-03 DIAGNOSIS — F319 Bipolar disorder, unspecified: Secondary | ICD-10-CM | POA: Insufficient documentation

## 2011-10-03 DIAGNOSIS — Z9119 Patient's noncompliance with other medical treatment and regimen: Secondary | ICD-10-CM | POA: Insufficient documentation

## 2011-10-03 DIAGNOSIS — I10 Essential (primary) hypertension: Secondary | ICD-10-CM | POA: Insufficient documentation

## 2011-10-03 DIAGNOSIS — F209 Schizophrenia, unspecified: Secondary | ICD-10-CM | POA: Insufficient documentation

## 2011-10-03 DIAGNOSIS — F1910 Other psychoactive substance abuse, uncomplicated: Secondary | ICD-10-CM | POA: Insufficient documentation

## 2011-10-03 DIAGNOSIS — Z91199 Patient's noncompliance with other medical treatment and regimen due to unspecified reason: Secondary | ICD-10-CM | POA: Insufficient documentation

## 2011-10-03 DIAGNOSIS — Z8619 Personal history of other infectious and parasitic diseases: Secondary | ICD-10-CM | POA: Insufficient documentation

## 2011-10-03 DIAGNOSIS — F17200 Nicotine dependence, unspecified, uncomplicated: Secondary | ICD-10-CM | POA: Insufficient documentation

## 2011-10-03 HISTORY — DX: Depression, unspecified: F32.A

## 2011-10-03 HISTORY — DX: Major depressive disorder, single episode, unspecified: F32.9

## 2011-10-03 HISTORY — DX: Anxiety disorder, unspecified: F41.9

## 2011-10-03 LAB — CBC
Hemoglobin: 14.5 g/dL (ref 12.0–15.0)
MCHC: 34.9 g/dL (ref 30.0–36.0)
Platelets: 146 10*3/uL — ABNORMAL LOW (ref 150–400)
RBC: 4.11 MIL/uL (ref 3.87–5.11)

## 2011-10-03 LAB — COMPREHENSIVE METABOLIC PANEL
ALT: 23 U/L (ref 0–35)
AST: 27 U/L (ref 0–37)
Albumin: 3.8 g/dL (ref 3.5–5.2)
CO2: 24 mEq/L (ref 19–32)
Calcium: 9.3 mg/dL (ref 8.4–10.5)
GFR calc non Af Amer: 81 mL/min — ABNORMAL LOW (ref >90)
Sodium: 136 mEq/L (ref 135–145)

## 2011-10-03 LAB — ETHANOL: Alcohol, Ethyl (B): <11 mg/dL (ref 0–11)

## 2011-10-03 LAB — RAPID URINE DRUG SCREEN, HOSP PERFORMED
Amphetamines: NOT DETECTED
Barbiturates: NOT DETECTED
Benzodiazepines: NOT DETECTED
Cocaine: POSITIVE — AB
Tetrahydrocannabinol: POSITIVE — AB

## 2011-10-03 MED ORDER — ONDANSETRON HCL 4 MG PO TABS: 4.0000 mg | ORAL_TABLET | Freq: Three times a day (TID) | ORAL | Status: DC | PRN

## 2011-10-03 MED ORDER — ZIPRASIDONE MESYLATE 20 MG IM SOLR
INTRAMUSCULAR | Status: AC
  Administered 2011-10-03: 20 mg
  Filled 2011-10-03: qty 20

## 2011-10-03 MED ORDER — IBUPROFEN 600 MG PO TABS
600.0000 mg | ORAL_TABLET | Freq: Three times a day (TID) | ORAL | Status: DC | PRN
  Administered 2011-10-04: 600 mg via ORAL
  Filled 2011-10-03: qty 1

## 2011-10-03 MED ORDER — ACETAMINOPHEN 325 MG PO TABS: 650.0000 mg | ORAL_TABLET | ORAL | Status: DC | PRN

## 2011-10-03 MED ORDER — ZOLPIDEM TARTRATE 5 MG PO TABS
5.0000 mg | ORAL_TABLET | Freq: Every evening | ORAL | Status: DC | PRN
  Administered 2011-10-03 – 2011-10-04 (×2): 5 mg via ORAL
  Filled 2011-10-03 (×2): qty 1

## 2011-10-03 MED ORDER — NICOTINE 21 MG/24HR TD PT24
21.0000 mg | MEDICATED_PATCH | Freq: Every day | TRANSDERMAL | Status: DC | PRN
  Filled 2011-10-03 (×2): qty 1

## 2011-10-03 MED ORDER — FLUOXETINE HCL 20 MG PO CAPS
20.0000 mg | ORAL_CAPSULE | Freq: Every day | ORAL | Status: DC
Start: 2011-10-03 — End: 2011-10-05
  Administered 2011-10-03 – 2011-10-05 (×3): 20 mg via ORAL
  Filled 2011-10-03 (×4): qty 1

## 2011-10-03 MED ORDER — HYDROXYZINE HCL 25 MG PO TABS
25.0000 mg | ORAL_TABLET | Freq: Four times a day (QID) | ORAL | Status: DC
  Administered 2011-10-03 – 2011-10-05 (×7): 25 mg via ORAL
  Filled 2011-10-03 (×7): qty 1

## 2011-10-03 MED ORDER — HALOPERIDOL 5 MG PO TABS
5.0000 mg | ORAL_TABLET | Freq: Every day | ORAL | Status: DC
  Administered 2011-10-03 – 2011-10-04 (×2): 5 mg via ORAL
  Filled 2011-10-03 (×2): qty 1

## 2011-10-03 NOTE — ED Notes (Signed)
Security, [employer] and sitter outside patient's rm. Orders received from [place], [place].

## 2011-10-03 NOTE — ED Provider Notes (Signed)
Followup note: Patient has now awakened after sedation with Geodon. She is requesting medication for anxiety. Her usual medications have not been restarted. Pt is awake, calm and states wants something for her nerves. She has not been taking meds at home because "how they make me feel".  She admits to home stress. She did not admit to hallucinations, but did tell ACT that she was having Auditory and Visual hallucinations, and was feeling homicidal towards her parents. She is pending a [facility] bed. Will restart home meds.  Flint Melter, MD 10/04/11 1302
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
## 2011-10-03 NOTE — ED Notes (Signed)
Pt placed in blue scrubs, wanded by security, removed jewelery (except for eyebrow ring, which pt states is "glued" in). Charge Nurse made aware and since pt will be moved to acute side, can remain at this time.

## 2011-10-03 NOTE — ED Notes (Signed)
Pacing, at door, wanting to leave. NT at door and [employer] at door.

## 2011-10-03 NOTE — ED Notes (Signed)
ACT Team in.
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
## 2011-10-03 NOTE — ED Notes (Signed)
Pt remains asleep

## 2011-10-03 NOTE — ED Notes (Signed)
Pt received to TCU rm 33. Sleeping, sitter present.
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
## 2011-10-03 NOTE — ED Notes (Signed)
Visitors asking to see pt at Triage. Pt sleeping so did not awaken.
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
## 2011-10-04 MED ORDER — LORAZEPAM 1 MG PO TABS
1.0000 mg | ORAL_TABLET | Freq: Three times a day (TID) | ORAL | Status: DC | PRN
  Administered 2011-10-04 (×2): 1 mg via ORAL
  Filled 2011-10-04 (×2): qty 1

## 2011-10-04 MED ORDER — NICOTINE 21 MG/24HR TD PT24
21.0000 mg | MEDICATED_PATCH | Freq: Every day | TRANSDERMAL | Status: DC
  Administered 2011-10-04: 21 mg via TRANSDERMAL

## 2011-10-04 NOTE — ED Notes (Signed)
Asking for something for nervousness and shakiness

## 2011-10-04 NOTE — ED Notes (Addendum)
Pt has been declined by [facility]. Pt is currently pending disposition from: [facility], [facility] and [facility]. CSW/ACT to continue following to ensure placement secured.   Pt has been declined by [facility] due to no [employer] available. Pt is also pending [facility].

## 2011-10-05 MED ORDER — HYDROXYZINE HCL 50 MG PO TABS: 50.0000 mg | ORAL_TABLET | Freq: Two times a day (BID) | ORAL | Status: DC | PRN

## 2011-10-05 MED ORDER — FLUOXETINE HCL 20 MG PO CAPS: 40.0000 mg | ORAL_CAPSULE | Freq: Every day | ORAL | Status: DC

## 2011-10-05 MED ORDER — HALOPERIDOL 5 MG PO TABS: 5.0000 mg | ORAL_TABLET | Freq: Every day | ORAL | Status: DC

## 2011-10-05 NOTE — ED Provider Notes (Signed)
  Physical Exam  BP 105/71  Pulse 76  Temp(Src) 99 F (37.2 C) (Oral)  Resp 18  SpO2 97%  Physical Exam  ED Course  Procedures  MDM Patient has had her tele-psychiatry consult. He recommended outpatient treatment persistent disease. He increased her Prozac up to 40 mg daily and continue her Haldol and Atarax. She'll be discharged home for followup      [name]. Rubin Payor, MD 10/05/11 1939

## 2011-10-05 NOTE — Discharge Planning (Signed)
Patient declined by [facility] due to no female beds available.    Manson Passey Deklan Minar ANN S , MSW, LCSWA 10/05/2011 7:51 AM [phone]

## 2011-10-05 NOTE — Discharge Instructions (Signed)
Chemical Dependency RESOURCE GUIDE  Dental Problems  Patients with Medicaid: [facility] [phone] [address]                                           [phone] [address] Phone:  [phone]                                                  Phone:  [phone]  If unable to pay or uninsured, contact:  [facility] or [facility] to become qualified for the adult dental clinic.  Chronic Pain Problems Contact [facility]  [phone] Patients need to be referred by their primary care doctor.  Insufficient Money for Medicine Contact United Way:  call "211" or [facility] [phone].  No Primary Care Doctor Call [facility]  [phone] Other agencies that provide inexpensive medical care    [facility]  [phone]    [facility]  [phone]    [facility]  [phone]    [facility]  [phone]    [facility]  [phone]    [facility]  [phone]  Psychological Services [facility]  [phone] [facility] Services  [phone] [facility]   [phone] (emergency services [phone])  Substance Abuse [facility]  [phone] [facility] [phone] [facility] [phone] Floydene Flock [phone] Residential & Outpatient Substance Abuse Program  [phone]  Abuse/Neglect [facility] Child Abuse Hotline [phone] [facility] Child Abuse Hotline [phone]) [phone] (After Hours)  Emergency Shelter [facility] [phone]  M[facility] [phone] [facility] [phone]  MRSA Hotline #:   479-630-5598    [facility] Resources  [facility]     United [employer]                          [facility] Dept. [address]                         [address]      [address]  Blondell Reveal Phone:  [phone]                                   Phone:  [phone]                 Phone:  [phone]  [facility] Mental [facility] Phone:  [phone]  [facility] Child Abuse Hotline [phone] (After Hours)   Chemical dependency is an addiction to drugs or alcohol. It is characterized by the repeated behavior of seeking out and using drugs and alcohol despite harmful consequences to the health and safety of ones self and others.  RISK FACTORS There are certain situations or behaviors that increase a person's risk for chemical dependency. These include:  A family history of chemical dependency.   A history of mental health issues, including depression and anxiety.   A home environment where drugs and alcohol are easily available to you.   Drug or alcohol use at a young age.  SYMPTOMS  The following symptoms can indicate chemical dependency:  Inability to limit the use of drugs or alcohol.   Nausea, sweating, shakiness, and anxiety that occurs when alcohol or drugs are not being used.   An increase in amount of drugs or alcohol that is necessary to get drunk or high.  People who experience these symptoms can assess their use of drugs and alcohol by asking themselves the following questions:  Have you been told by friends or family that they are worried about your use of alcohol or drugs?   Do friends and family ever tell you about things you did while drinking alcohol or using drugs that you do not remember?   Do you lie about using alcohol or drugs or about the amounts you use?   Do you have difficulty completing daily tasks unless you use alcohol or drugs?   Is the level of your work or school performance lower because of your drug or alcohol use?   Do you get sick from using drugs or alcohol but keep  using anyway?   Do you feel uncomfortable in social situations unless you use alcohol or drugs?   Do you use drugs or alcohol to help forget problems?  An answer of yes to any of these questions may indicate chemical dependency. Professional evaluation is suggested. Document Released: 07/05/2001 Document Revised: 06/30/2011 Document Reviewed: 09/16/2010 [facility] Patient Information 2012 [place], [place].Manic Depression (Bipolar Disorder) Bipolar disorder is also known as manic depressive illness. It is when the brain does not function properly and causes shifts in a person's moods, energy and ability to function in everyday life. These shifts are different from the normal ups and downs that everyone experiences. Instead the shifts are severe. If this goes untreated, the person's life becomes more and more disorderly. People with this disorder can be treated can lead full and productive lives. This disorder must be managed throughout life.  SYMPTOMS   Bipolar disorder causes dramatic mood swings. These mood swings go in cycles. They cycle from extreme "highs" and irritable to deep "lows" of sadness and hopelessness.   Between the extreme moods, there are usually periods of normal mood.   Along with the mood shifts, the person will have severe changes in energy and behavior. The periods of "highs" and "lows" are called episodes of mania and depression.  Signs of mania:  Lots of energy, activity and restlessness.   Extreme "high" or good mood.   Extreme irritability.   Racing thoughts and talking very fast.   Jumping from one idea to another.   Not able to focus, easily distracted.   Little need to sleep.   Grand beliefs in one's abilities and powers.   Spending sprees.   Increased sexual drive. This can result in many sexual partners.   Poor judgment.  Abuse of drugs, particularly cocaine, alcohol, and sleeping medication.   Aggressive or provocative behavior.   A lasting  period of behavior that is different from usual.   Denial that anything is wrong.  *A manic episode is identified if a "high" mood happens with three or more of the other symptoms lasting most of the day, nearly everyday for a week or longer. If the mood is more irritable in nature, four additional symptoms must be present. Signs of depression:  Lasting feelings of sadness, anxiety, or empty mood.   Feelings of hopelessness with negative thoughts.   Feelings of guilt, worthlessness, or helplessness.   Loss of interest or pleasure in activities once enjoyed, including sex.   Feelings of fatigue or having less energy.   Trouble focusing, making decisions, remembering.   Feeling restless or irritable.   Sleeping too little or too much.   Change in eating with possible weight gain or loss.   Feeling ongoing pain that is not caused by physical illness or injury.   Thoughts of death or suicide or suicide attempts.  *A depressive episode is identified as having five or more of the above symptoms that last most of the day, nearly everyday for two weeks or longer. CAUSES   Research shows that there is no single cause for the disorder. Many factors act together to produce the illness.   This can be passed down from family (hereditary).   Environment may play a part.  TREATMENT   Long-term treatment is strongly recommended because bipolar disorder is a repeated illness. This disorder is better controlled if treatment is ongoing than if it is off and on.   A combination of medication and talk therapy is best for managing the disorder over time.   Medication.   Medication can be prescribed by a doctor that is an expert in treating mental disorders (psychiatrists). Medications known as "mood stabilizers" are usually prescribed to help control the illness. Other medications can be added when needed. These medicines usually treat episodes of mania or depression that break through despite  the mood stabilizer.   Talk Therapy.   Along with medication, some forms of talk therapy are helpful in providing support, education and guidance to people with the illness and their families. Studies show that this type of treatment increases mood stability, decreases need for hospitalization and improves how they function society.   Electroconvulsive Therapy (ECT).   In extreme situations where the above treatments do not work or work too slowly to relieve severe symptoms, ECT may be considered.  Document Released: 10/17/2000 Document Revised: 06/30/2011 Document Reviewed: 06/08/2007 [facility] Patient Information 2012 [place], [place].Polysubstance Abuse When people abuse more than one drug or type of drug it is called polysubstance or polydrug abuse. For example, many smokers also drink alcohol. This is one form of polydrug abuse. Polydrug abuse also refers to the use of a drug to counteract an unpleasant effect produced by another drug. It may also be used to help with withdrawal from another drug. People who take stimulants may become agitated. Sometimes this agitation is countered with a tranquilizer. This helps protect against the unpleasant side effects. Polydrug abuse also refers to the use of different drugs at the same time.  Anytime drug use is interfering with normal living activities, it has become abuse. This includes problems with family and friends. Psychological dependence has developed when your mind tells you that the drug is needed. This is usually followed by physical dependence which has  developed when continuing increases of drug are required to get the same feeling or "high". This is known as addiction or chemical dependency. A person's risk is much higher if there is a history of chemical dependency in the family. SIGNS OF CHEMICAL DEPENDENCY  You have been told by friends or family that drugs have become a problem.   You fight when using drugs.   You are having blackouts  (not remembering what you do while using).   You feel sick from using drugs but continue using.   You lie about use or amounts of drugs (chemicals) used.   You need chemicals to get you going.   You are suffering in work performance or in school because of drug use.   You get sick from use of drugs but continue to use anyway.   You need drugs to relate to people or feel comfortable in social situations.   You use drugs to forget problems.  "Yes" answered to any of the above signs of chemical dependency indicates there are problems. The longer the use of drugs continues, the greater the problems will become. If there is a family history of drug or alcohol use, it is best not to experiment with these drugs. Continual use leads to tolerance. After tolerance develops more of the drug is needed to get the same feeling. This is followed by addiction. With addiction, drugs become the most important part of life. It becomes more important to take drugs than participate in the other usual activities of life. This includes relating to friends and family. Addiction is followed by dependency. Dependency is a condition where drugs are now needed not just to get high, but to feel normal. Addiction cannot be cured but it can be stopped. This often requires outside help and the care of professionals. Treatment centers are listed in the yellow pages under: Cocaine, Narcotics, and Alcoholics Anonymous. Most hospitals and clinics can refer you to a specialized care center. Talk to your caregiver if you need help. Document Released: 03/02/2005 Document Revised: 06/30/2011 Document Reviewed: 07/11/2005 [facility] Patient Information 2012 [place], [place].

## 2011-10-05 NOTE — ED Notes (Signed)
Discharge instructions reviewed with pt, Rx for Prozac 40mg  given, pt also requests paper Rx for Vistaril and Haldol, as she is out of her meds at home. EDP agrees to provide them. Pt states she has called for a ride and that she guesses that they will be here shortly.

## 2011-10-05 NOTE — ED Notes (Signed)
Pt has completed her tele-psych consult which has changed pt's medication and has recommended discharge home with follow up at her regular psychiatrist at [facility]. EDP was notified and is in agreement with tele-psych decision to discharge pt this evening. CSW has reviewed with the pt outpatient resources such as mobile crisis, CDIOP services, [place] [facility], etc. Pt is able to contract for safety at this time. Pt has a psychiatry appointment at [facility] on 10/18/11 but agrees to contact [facility] in the morning to move the appointment to an earlier date. No further needs identified at this time.

## 2011-10-05 NOTE — ED Notes (Signed)
TC with MD for telepsych. Gave him update on pt and requesting for medication evaluation.

## 2011-10-05 NOTE — ED Provider Notes (Signed)
BP 93/64  Pulse 73  Temp(Src) 98.4 F (36.9 C) (Oral)  Resp 18  SpO2 98%  Patient seen and evaluated by me. No complaints at this time.   Forbes Cellar, MD 10/05/11 [phone]

## 2011-10-05 NOTE — ED Notes (Signed)
Pt discharged to self/home and escorted to front lobby. Departs in safe and stable condition.

## 2011-10-05 NOTE — ED Notes (Signed)
Telepsych performed, recommends discharge with follow up in 2 weeks. Medication recommendations made and EDP aware. Pt pending discharge, SW preparing list of support references for pt.
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
## 2011-10-20 NOTE — ED Provider Notes (Signed)
Evaluation and management procedures were performed by the PA/NP under my supervision/collaboration.   Felisa Bonier, MD 10/20/11 1014

## 2011-12-13 ENCOUNTER — Encounter (HOSPITAL_COMMUNITY): Payer: Self-pay | Admitting: Emergency Medicine

## 2011-12-13 ENCOUNTER — Emergency Department (HOSPITAL_COMMUNITY)
Admission: EM | Admit: 2011-12-13 | Discharge: 2011-12-14 | Disposition: A | Discharge status: Home / Self Care | Payer: Self-pay | Attending: Emergency Medicine | Admitting: Emergency Medicine

## 2011-12-13 DIAGNOSIS — R0602 Shortness of breath: Secondary | ICD-10-CM | POA: Insufficient documentation

## 2011-12-13 DIAGNOSIS — L0291 Cutaneous abscess, unspecified: Secondary | ICD-10-CM | POA: Insufficient documentation

## 2011-12-13 DIAGNOSIS — M7989 Other specified soft tissue disorders: Secondary | ICD-10-CM | POA: Insufficient documentation

## 2011-12-13 DIAGNOSIS — L0390 Cellulitis, unspecified: Secondary | ICD-10-CM

## 2011-12-13 MED ORDER — OXYCODONE-ACETAMINOPHEN 5-325 MG PO TABS: 1.0000 | ORAL_TABLET | Freq: Four times a day (QID) | ORAL | Status: AC | PRN

## 2011-12-13 MED ORDER — SULFAMETHOXAZOLE-TRIMETHOPRIM 800-160 MG PO TABS: 1.0000 | ORAL_TABLET | Freq: Two times a day (BID) | ORAL | Status: AC

## 2011-12-13 MED ORDER — HYDROMORPHONE HCL PF 1 MG/ML IJ SOLN
1.0000 mg | Freq: Once | INTRAMUSCULAR | Status: AC
  Administered 2011-12-13: 1 mg via INTRAMUSCULAR
  Filled 2011-12-13: qty 1

## 2011-12-13 MED ORDER — CEPHALEXIN 500 MG PO CAPS: 500.0000 mg | ORAL_CAPSULE | Freq: Four times a day (QID) | ORAL | Status: AC

## 2011-12-13 NOTE — Discharge Instructions (Signed)

## 2011-12-13 NOTE — ED Notes (Signed)
Pt ambulated independently with no signs of distress out of department before d/c instructions were given. Pt was seen with two other adults getting in a car.

## 2011-12-13 NOTE — ED Notes (Signed)
Per EMS.  Pt from home.  Pt c/o insect bite on Friday.  Now complains of L lower leg swelling below knee.  Also c/o not being able to catch breath, and feels that her airway is closing.  Pt speaking full sentences, 97% on RA.

## 2011-12-13 NOTE — ED Provider Notes (Signed)
History     CSN: [id number]  Arrival date & time 12/13/11  1641   First MD Initiated Contact with Patient 12/13/11 1821      Chief Complaint  Patient presents with  . Leg Swelling  . Shortness of Breath    (Consider location/radiation/quality/duration/timing/severity/associated sxs/prior treatment) Patient is a 41 y.o. female presenting with shortness of breath. The history is provided by the patient.  Shortness of Breath  The current episode started 3 to 5 days ago. Associated symptoms include shortness of breath. Pertinent negatives include no chest pain and no fever.   patient has had pain and swelling to her right lower leg this Friday. She states that she's had a little bit of shortness of breath along with it. She states one of them drained some pus she states she's had some chills. No clear fevers. No chest pain. She's not had abscesses previously. No trauma. She states she may have had an insect bite on Friday.  Past Medical History  Diagnosis Date  . Hepatitis   . Hypertension   . Anxiety   . Depression     History reviewed. No pertinent past surgical history.  History reviewed. No pertinent family history.  History  Substance Use Topics  . Smoking status: Current Everyday Smoker  . Smokeless tobacco: Not on file  . Alcohol Use: Yes    OB History    Grav Para Term Preterm Abortions TAB SAB Ect Mult Living                  Review of Systems  Constitutional: Positive for chills. Negative for fever.  HENT: Negative for congestion.   Respiratory: Positive for shortness of breath.   Cardiovascular: Negative for chest pain.  Gastrointestinal: Negative for abdominal pain.  Skin: Positive for color change and wound.    Allergies  Review of patient's allergies indicates no known allergies.  Home Medications   Current Outpatient Rx  Name Route Sig Dispense Refill  . FLUOXETINE HCL 20 MG PO CAPS Oral Take 2 capsules (40 mg total) by mouth daily. 30 capsule  1  . HALOPERIDOL 5 MG PO TABS Oral Take 1 tablet (5 mg total) by mouth at bedtime. 30 tablet 0  . HYDROXYZINE HCL 50 MG PO TABS Oral Take 1 tablet (50 mg total) by mouth 2 (two) times daily as needed. For itching. 30 tablet 0  . CEPHALEXIN 500 MG PO CAPS Oral Take 1 capsule (500 mg total) by mouth 4 (four) times daily. 20 capsule 0  . OXYCODONE-ACETAMINOPHEN 5-325 MG PO TABS Oral Take 1-2 tablets by mouth every 6 (six) hours as needed for pain. 20 tablet 0  . SULFAMETHOXAZOLE-TRIMETHOPRIM 800-160 MG PO TABS Oral Take 1 tablet by mouth 2 (two) times daily. 10 tablet 0    BP 108/83  Pulse 64  Temp(Src) 97.9 F (36.6 C) (Oral)  Resp 18  SpO2 100%  Physical Exam  Constitutional: She appears well-developed.  HENT:  Head: Normocephalic.  Cardiovascular: Normal rate and normal heart sounds.   Pulmonary/Chest: Effort normal and breath sounds normal.  Abdominal: Soft. There is no tenderness.  Musculoskeletal: She exhibits tenderness.       Right lower leg has a 1 cm tender indurated area with surrounding erythema and central pustule. Also there is a larger approximately 3 cm tender indurated area with central shallow ulcer. No fluctuance. Approximately 2 cm ring of erythema surrounding it. No edema over the tibia.  Neurological: She is alert.  Skin: No  rash noted. There is erythema.    ED Course  Procedures (including critical care time)  Labs Reviewed - No data to display No results found.   1. Cellulitis       MDM  Patient with apparent cellulitis right lower leg. No clear abscess. I doubt DVT. Patient is not tachypneic. No fluctuance. Patient given pain medications and antibiotics and will follow up as needed.        Juliet Rude. Rubin Payor, MD 12/13/11 [phone]

## 2012-01-02 ENCOUNTER — Encounter (HOSPITAL_COMMUNITY): Payer: Self-pay | Admitting: *Deleted

## 2012-01-02 ENCOUNTER — Emergency Department (HOSPITAL_COMMUNITY)
Admission: EM | Admit: 2012-01-02 | Discharge: 2012-01-08 | Disposition: A | Discharge status: Home / Self Care | Payer: Self-pay | Attending: Emergency Medicine | Admitting: Emergency Medicine

## 2012-01-02 DIAGNOSIS — R45851 Suicidal ideations: Secondary | ICD-10-CM | POA: Insufficient documentation

## 2012-01-02 DIAGNOSIS — F3289 Other specified depressive episodes: Secondary | ICD-10-CM | POA: Insufficient documentation

## 2012-01-02 DIAGNOSIS — F329 Major depressive disorder, single episode, unspecified: Secondary | ICD-10-CM | POA: Insufficient documentation

## 2012-01-02 LAB — COMPREHENSIVE METABOLIC PANEL
AST: 70 U/L — ABNORMAL HIGH (ref 0–37)
Albumin: 4 g/dL (ref 3.5–5.2)
Chloride: 104 mEq/L (ref 96–112)
Creatinine, Ser: 0.84 mg/dL (ref 0.50–1.10)
Potassium: 3.7 mEq/L (ref 3.5–5.1)
Total Bilirubin: 0.4 mg/dL (ref 0.3–1.2)
Total Protein: 8 g/dL (ref 6.0–8.3)

## 2012-01-02 LAB — CBC
MCV: 102.3 fL — ABNORMAL HIGH (ref 78.0–100.0)
Platelets: 165 10*3/uL (ref 150–400)
RDW: 13.1 % (ref 11.5–15.5)
WBC: 6.9 10*3/uL (ref 4.0–10.5)

## 2012-01-02 NOTE — ED Notes (Signed)
Per Uncle took pt to [facility] due to pt feeling suicidal; several attempts in the past for same. Pt nonverbal on assessment; family states she gets in "zones" where she won't talk; pt calm at time of arrival

## 2012-01-03 DIAGNOSIS — F329 Major depressive disorder, single episode, unspecified: Secondary | ICD-10-CM

## 2012-01-03 DIAGNOSIS — F1910 Other psychoactive substance abuse, uncomplicated: Secondary | ICD-10-CM

## 2012-01-03 LAB — RAPID URINE DRUG SCREEN, HOSP PERFORMED
Amphetamines: NOT DETECTED
Benzodiazepines: NOT DETECTED
Opiates: NOT DETECTED
Tetrahydrocannabinol: POSITIVE — AB

## 2012-01-03 MED ORDER — ZOLPIDEM TARTRATE 5 MG PO TABS
5.0000 mg | ORAL_TABLET | Freq: Every evening | ORAL | Status: DC | PRN
  Administered 2012-01-03 – 2012-01-07 (×5): 5 mg via ORAL
  Filled 2012-01-03 (×5): qty 1

## 2012-01-03 MED ORDER — ACETAMINOPHEN 325 MG PO TABS
650.0000 mg | ORAL_TABLET | ORAL | Status: DC | PRN
  Administered 2012-01-06: 650 mg via ORAL
  Filled 2012-01-03: qty 2

## 2012-01-03 MED ORDER — LORAZEPAM 1 MG PO TABS
1.0000 mg | ORAL_TABLET | Freq: Three times a day (TID) | ORAL | Status: DC | PRN
  Administered 2012-01-03 – 2012-01-08 (×6): 1 mg via ORAL
  Filled 2012-01-03 (×6): qty 1

## 2012-01-03 NOTE — ED Provider Notes (Signed)
BP 103/66  Pulse 66  Temp(Src) 98.6 F (37 C) (Oral)  Resp 20  SpO2 98%  LMP 12/19/2011   Called by nursing staff for hypotension SBP 80s while sleeping. Recheck when awake.  Once aroused back to baseline low 100s. Asx. Nursing staff to update if continued issues concerning blood pressure.  Forbes Cellar, MD 01/03/12 2044

## 2012-01-03 NOTE — Consult Note (Signed)
Reason for Consult: depression suicidal ideations, and substance abuse Referring Physician: Dr. Ellen [name] [name] is an 41 y.o. female.  HPI: Patient was seen and chart reviewed. This is a 41 years old, single, African American female, presented to the [facility] long emergency department with the chief complaints about depression, suicidal ideation, and homicidal ideation. Patient reportedly has been doing drugs including marijuana and cocaine. Patient reported that her children was taken away by her mother custody and she want to hurt her mother yet. Patient reported she was given diagnosis of schizophrenia, by some, but in the past and she applied for date disability, which was resected twice and now she has to attorney, who is a applying for her again. 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 Patient has been living here and there with friends, but no proper home. Patient reported she has been depressed since her grandmother passed away in 2003/01/17 never able to get over with it. Patient has multiple acute [facility]. Patient reportedly living with the, Mr. [name], and his girlfriend for the last 2 months. Patient was found depressed, crying, tearful, and trying to cut her wrist when she was intoxicated with alcohol and drugs. Patient reported she was received the medication management from the [facility] in the past, who given Prozac and Haldol, but the don't work for her. Patient stated that if she was discharged. She is going to overdose and kill herself.   Past Medical History  Diagnosis Date  . Hepatitis   . Hypertension   . Anxiety   . Depression     History reviewed. No pertinent past surgical history.  History reviewed. No pertinent family history.  Social History:  reports that she has been smoking.  She does not have  any smokeless tobacco history on file. She reports that she drinks alcohol. She reports that she uses illicit drugs (Cocaine and Marijuana).  Allergies: No Known Allergies  Medications: I have reviewed the patient's current medications.  Results for orders placed during the hospital encounter of 01/02/12 (from the past 48 hour(s))  CBC     Status: Abnormal   Collection Time   01/02/12 10:36 PM      Component Value Range Comment   WBC 6.9  4.0 - 10.5 (K/uL)    RBC 4.33  3.87 - 5.11 (MIL/uL)    Hemoglobin 15.4 (*) 12.0 - 15.0 (g/dL)    HCT 47.8  29.5 - 62.1 (%)    MCV 102.3 (*) 78.0 - 100.0 (fL)    MCH 35.6 (*) 26.0 - 34.0 (pg)    MCHC 34.8  30.0 - 36.0 (g/dL)    RDW 30.8  65.7 - 84.6 (%)    Platelets 165  150 - 400 (K/uL)   COMPREHENSIVE METABOLIC PANEL     Status: Abnormal   Collection Time   01/02/12 10:36 PM      Component Value Range Comment   Sodium 139  135 - 145 (mEq/L)    Potassium 3.7  3.5 - 5.1 (mEq/L)    Chloride 104  96 - 112 (mEq/L)    CO2 25  19 - 32 (mEq/L)    Glucose, Bld 83  70 - 99 (mg/dL)    BUN 8  6 - 23 (mg/dL)    Creatinine, Ser 9.62  0.50 - 1.10 (mg/dL)    Calcium 9.1  8.4 - 10.5 (mg/dL)    Total Protein 8.0  6.0 - 8.3 (g/dL)    Albumin 4.0  3.5 - 5.2 (g/dL)    AST 70 (*) 0 - 37 (U/L)    ALT 132 (*) 0 - 35 (U/L)    Alkaline Phosphatase 75  39 - 117 (U/L)    Total Bilirubin 0.4  0.3 - 1.2 (mg/dL)    GFR calc non Af Amer 87 (*) >90 (mL/min)    GFR calc Af Amer >90  >90 (mL/min)   ETHANOL     Status: Normal   Collection Time   01/02/12 10:36 PM      Component Value Range Comment   Alcohol, Ethyl (B) <11  0 - 11 (mg/dL)   URINE RAPID DRUG SCREEN (HOSP PERFORMED)     Status: Abnormal   Collection Time   01/02/12 11:50 PM      Component Value Range Comment   Opiates NONE DETECTED  NONE DETECTED     Cocaine POSITIVE (*) NONE DETECTED     Benzodiazepines NONE DETECTED  NONE DETECTED     Amphetamines NONE DETECTED  NONE DETECTED     Tetrahydrocannabinol  POSITIVE (*) NONE DETECTED     Barbiturates NONE DETECTED  NONE DETECTED    PREGNANCY, URINE     Status: Normal   Collection Time   01/02/12 11:50 PM      Component Value Range Comment   Preg Test, Ur NEGATIVE  NEGATIVE      No results found.  No psychosis and Positive for anorexia, anxiety, bad mood, behavior problems, borderline personality disorder, depression, excessive alcohol consumption, illegal drug usage, mood swings, sleep disturbance and tobacco use Blood pressure 88/53, pulse 66, temperature 98.6 F (37 C), temperature source Oral, resp. rate 20, last menstrual period 12/19/2011, SpO2 98.00%.   Assessment/Plan: Depression, secondary to substance abuse (cocaine and cannabis positive) Polysubstance abuse versus dependence  Recommendations to admit acute psychiatric hospitalization for depression and substance abuse.  [name],JANARDHAHA R. 01/03/2012, 5:50 PM

## 2012-01-03 NOTE — ED Notes (Signed)
Informed MD of low BP-encouraged pt to become more awake/active so BP can be rechecked-pt did not respond to RN's request-will continue to monitor

## 2012-01-03 NOTE — ED Provider Notes (Signed)
Patient is depressed, somewhat sleepy, will answer questions when encouraged to do so. She relates being unhappy, but would not specify a particular social stressors. She is apparently not getting along well with family members. She states she does not currently have a therapist.   Flint Melter, MD 01/03/12 [phone]
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
## 2012-01-03 NOTE — ED Notes (Signed)
MD notified of BP recheck

## 2012-01-03 NOTE — ED Notes (Signed)
Became agitated when woken up for vitals/assessment-would not answer RN's questions-will continue to monitor

## 2012-01-03 NOTE — ED Notes (Signed)
Pt in room lying in bed at start of shift, refusing to speak with staff, pt later heard crying loudly/uncontrollably, writer attempts to console pt, offering pt a shower as it was reported pt laid in bed all day. Pt continues to cry and unable to speak, refuses shower. Minutes comes back to desk and states she does want to take shower, pt allowed to take a shower, linen changed, room refreshed. Writer noted pt had not eaten dinner, pt states she could not eat the food because he hurt her stomach, pt given sandwich, chips and cheese and gingerale to drink. pt thankful and affect appears to be a bit brighter. Pt does report she is still hearing voices and feeling suicidal, however does CFS, states she did not talk with the MD when he came around on previous shift, pt encouraged to speak with the MD and communicate her needs so she can start back on her medications. Pt shook head in agreement and advised if she needed anything else Clinical research associate is available.

## 2012-01-03 NOTE — ED Provider Notes (Signed)
History     CSN: [id number]  Arrival date & time 01/02/12  2205   First MD Initiated Contact with Patient 01/03/12 0011      Chief Complaint  Patient presents with  . Medical Clearance    (Consider location/radiation/quality/duration/timing/severity/associated sxs/prior treatment) HPI Pt with long history of depression reports several weeks of worsening mood and suicidal ideation. She has no specific plan at this time. Denies any somatic complaints. No hallucinations. She was seen at [facility] and sent to the ED for medical clearance and placement.   Past Medical History  Diagnosis Date  . Hepatitis   . Hypertension   . Anxiety   . Depression     History reviewed. No pertinent past surgical history.  History reviewed. No pertinent family history.  History  Substance Use Topics  . Smoking status: Current Everyday Smoker  . Smokeless tobacco: Not on file  . Alcohol Use: Yes    OB History    Grav Para Term Preterm Abortions TAB SAB Ect Mult Living                  Review of Systems All other systems reviewed and are negative except as noted in HPI.   Allergies  Review of patient's allergies indicates no known allergies.  Home Medications   Current Outpatient Rx  Name Route Sig Dispense Refill  . FLUOXETINE HCL 20 MG PO CAPS Oral Take 2 capsules (40 mg total) by mouth daily. 30 capsule 1  . HALOPERIDOL 5 MG PO TABS Oral Take 1 tablet (5 mg total) by mouth at bedtime. 30 tablet 0  . HYDROXYZINE HCL 50 MG PO TABS Oral Take 1 tablet (50 mg total) by mouth 2 (two) times daily as needed. For itching. 30 tablet 0    BP 102/67  Pulse 70  Temp(Src) 98 F (36.7 C) (Oral)  Resp 16  SpO2 100%  LMP 12/19/2011  Physical Exam  Constitutional: She is oriented to person, place, and time. She appears well-developed and well-nourished.  HENT:  Head: Normocephalic and atraumatic.  Neck: Neck supple.  Pulmonary/Chest: Effort normal.  Neurological: She is alert and  oriented to person, place, and time. No cranial nerve deficit.  Psychiatric: Her behavior is normal.       Flat affect, depressed mood     ED Course  Procedures (including critical care time)  Labs Reviewed  CBC - Abnormal; Notable for the following:    Hemoglobin 15.4 (*)    MCV 102.3 (*)    MCH 35.6 (*)    All other components within normal limits  COMPREHENSIVE METABOLIC PANEL - Abnormal; Notable for the following:    AST 70 (*)    ALT 132 (*)    GFR calc non Af Amer 87 (*)    All other components within normal limits  URINE RAPID DRUG SCREEN (HOSP PERFORMED) - Abnormal; Notable for the following:    Cocaine POSITIVE (*)    Tetrahydrocannabinol POSITIVE (*)    All other components within normal limits  ETHANOL  PREGNANCY, URINE   No results found.   No diagnosis found.    MDM  Pt moved to Psych holding. Awaiting ACT call back for placement.         Bates Collington B. Bernette Mayers, MD 01/03/12 2126

## 2012-01-03 NOTE — ED Notes (Signed)
Report received-airway intact-no s/s's of distress-will continue to monitor 

## 2012-01-04 MED ORDER — HALOPERIDOL 5 MG PO TABS
5.0000 mg | ORAL_TABLET | Freq: Every day | ORAL | Status: DC
  Administered 2012-01-04 – 2012-01-07 (×4): 5 mg via ORAL
  Filled 2012-01-04 (×4): qty 1

## 2012-01-04 MED ORDER — HYDROXYZINE HCL 25 MG PO TABS
50.0000 mg | ORAL_TABLET | Freq: Two times a day (BID) | ORAL | Status: DC | PRN
  Administered 2012-01-05: 50 mg via ORAL
  Filled 2012-01-04: qty 2

## 2012-01-04 MED ORDER — FLUOXETINE HCL 20 MG PO CAPS
40.0000 mg | ORAL_CAPSULE | Freq: Every day | ORAL | Status: DC
  Administered 2012-01-04 – 2012-01-08 (×5): 40 mg via ORAL
  Filled 2012-01-04 (×5): qty 2

## 2012-01-04 NOTE — ED Provider Notes (Signed)
8:29 AM Filed Vitals:   01/04/12 0626  BP: 90/59  Pulse: 63  Temp: 98.5 F (36.9 C)  Resp: 18   Awaiting placement. Calm and cooperative this AM.   Lyanne Co, MD 01/04/12 [phone]

## 2012-01-04 NOTE — ED Notes (Signed)
Pt c/o of nausea. Attempt to call EDP. No answer. Will attempt to re-call.

## 2012-01-05 MED ORDER — NICOTINE 21 MG/24HR TD PT24
21.0000 mg | MEDICATED_PATCH | Freq: Once | TRANSDERMAL | Status: AC
  Administered 2012-01-05 – 2012-01-06 (×2): 21 mg via TRANSDERMAL
  Filled 2012-01-05 (×2): qty 1

## 2012-01-05 NOTE — ED Notes (Signed)
Pt states she is hearing voices-increases with stress that's why she came to hospital-unable to get relief from meds prescribed-cant find a combination that works for a long period of time-doesn't have medicaid so she is unable to afford meds consistently-ativan 1 mg given for anxiety-pt also showered and change sheets on bed

## 2012-01-05 NOTE — ED Notes (Signed)
Report received-airway intact-no s/s's of distress-will continue to monitor 

## 2012-01-05 NOTE — ED Notes (Signed)
Pt says she is still hearing voices telling her to harm herself but shes here and and cant do anything about it.

## 2012-01-05 NOTE — ED Provider Notes (Signed)
BP 95/64  Pulse 62  Temp 98.6 F (37 C) (Oral)  Resp 18  SpO2 99%  LMP 12/19/2011  Pt resting comfortably. VS stable. No acute issues overnight. Awaiting placement.      Loren Racer, MD 01/05/12 [phone]

## 2012-01-05 NOTE — ED Notes (Signed)
Attempted to engage in therapeutic interaction with patient-pt would not respond to simple questions, would not acknowledge RN-grinding teeth, poor eye contact, appears slightly agitated-will continue to monitor

## 2012-01-06 MED ORDER — IBUPROFEN 600 MG PO TABS
600.0000 mg | ORAL_TABLET | Freq: Four times a day (QID) | ORAL | Status: DC | PRN
  Administered 2012-01-06 – 2012-01-08 (×5): 600 mg via ORAL
  Filled 2012-01-06 (×5): qty 1

## 2012-01-06 MED ORDER — IBUPROFEN 800 MG PO TABS
ORAL_TABLET | ORAL | Status: AC
  Filled 2012-01-06: qty 11

## 2012-01-06 MED ORDER — IBUPROFEN 800 MG PO TABS
800.0000 mg | ORAL_TABLET | Freq: Once | ORAL | Status: AC
  Administered 2012-01-06: 800 mg via ORAL

## 2012-01-06 NOTE — ED Provider Notes (Signed)
Filed Vitals:   01/06/12 0524  BP: 99/66  Pulse: 69  Temp: 98.3 F (36.8 C)  Resp: 18  Patient is resting comfortably. Remains stable overnight without any complaints. Awaiting formal disposition from psychiatry.  Labs reviewed.  Orders reviewed and appropriate    Dayton Bailiff, MD 01/06/12 [phone]

## 2012-01-06 NOTE — ED Notes (Signed)
Pt sleeping. Has had minimal interaction with staff and shows little interest in coming out of room. Refused shower when offered. Resistant to conversing with staff

## 2012-01-07 MED ORDER — NICOTINE 21 MG/24HR TD PT24
21.0000 mg | MEDICATED_PATCH | Freq: Every morning | TRANSDERMAL | Status: DC
  Administered 2012-01-07 – 2012-01-08 (×2): 21 mg via TRANSDERMAL
  Filled 2012-01-07 (×2): qty 1

## 2012-01-07 NOTE — ED Notes (Signed)
Pt up using the phone, her father called in to speak to her.

## 2012-01-07 NOTE — ED Notes (Signed)
Pt sitting up in the bed, tech is taking VS currently

## 2012-01-07 NOTE — BHH Counselor (Signed)
Pt's information has been faxed to [facility] and verbal confirmation given to RN. Authorization number is [id number].  Waiting for call back to confirm pt is on wait list.

## 2012-01-07 NOTE — ED Notes (Signed)
Pt up to shower
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
## 2012-01-07 NOTE — ED Provider Notes (Signed)
  Physical Exam  BP 98/63  Pulse 66  Temp 98.4 F (36.9 C) (Oral)  Resp 18  SpO2 99%  LMP 12/19/2011  Physical Exam  ED Course  Procedures  MDM D/w ACT. Pending [facility] on wait list      Juliet Rude. Rubin Payor, MD 01/07/12 [phone]

## 2012-01-08 NOTE — ED Provider Notes (Signed)
Currently having no complaints. She is on the waiting list for [facility].  Dione Booze, MD 01/08/12 [phone]

## 2012-01-08 NOTE — ED Notes (Signed)
Discharge instructions reviewed w/ pt., verbalizes understanding NO prescriptions provided at discharge. 

## 2012-01-08 NOTE — Discharge Instructions (Signed)
Followup with your Dr. for reevaluation.  Return for recurrent thoughts of harming yourself or others.

## 2012-01-08 NOTE — B[facility] Counselor (Signed)
Therapist reviewed the Telepsych that has recommended that the patient should be discharged.  The patient's last [facility] Telepsych was on January 07, 2012.  Because it has not been over 24 hours the patient will not be re-assessed.  Therapist met the patient and she denied any HI/SI.  Patient denied any psychosis.  Therapist met with the ER/MD and discussed the Telepsych and discharge with OPT referrals and a no harm contract.  Therapist met with the patient and the patient was given OPT referrals and the patient signed a no harm contract.

## 2012-01-08 NOTE — ED Provider Notes (Signed)
[facility] psych consult was performed.  The psychiatrist, from it recommends discharge.  Cheri Guppy, MD 01/08/12 1640

## 2012-09-20 ENCOUNTER — Emergency Department (HOSPITAL_COMMUNITY): Payer: Self-pay

## 2012-09-20 ENCOUNTER — Encounter (HOSPITAL_COMMUNITY): Payer: Self-pay | Admitting: Adult Health

## 2012-09-20 ENCOUNTER — Emergency Department (HOSPITAL_COMMUNITY)
Admission: EM | Admit: 2012-09-20 | Discharge: 2012-09-20 | Disposition: A | Discharge status: Home / Self Care | Payer: Self-pay | Attending: Emergency Medicine | Admitting: Emergency Medicine

## 2012-09-20 DIAGNOSIS — F329 Major depressive disorder, single episode, unspecified: Secondary | ICD-10-CM | POA: Insufficient documentation

## 2012-09-20 DIAGNOSIS — S8392XA Sprain of unspecified site of left knee, initial encounter: Secondary | ICD-10-CM

## 2012-09-20 DIAGNOSIS — Y929 Unspecified place or not applicable: Secondary | ICD-10-CM | POA: Insufficient documentation

## 2012-09-20 DIAGNOSIS — Z3202 Encounter for pregnancy test, result negative: Secondary | ICD-10-CM | POA: Insufficient documentation

## 2012-09-20 DIAGNOSIS — I10 Essential (primary) hypertension: Secondary | ICD-10-CM | POA: Insufficient documentation

## 2012-09-20 DIAGNOSIS — IMO0002 Reserved for concepts with insufficient information to code with codable children: Secondary | ICD-10-CM | POA: Insufficient documentation

## 2012-09-20 DIAGNOSIS — Z79899 Other long term (current) drug therapy: Secondary | ICD-10-CM | POA: Insufficient documentation

## 2012-09-20 DIAGNOSIS — N739 Female pelvic inflammatory disease, unspecified: Secondary | ICD-10-CM

## 2012-09-20 DIAGNOSIS — Z8619 Personal history of other infectious and parasitic diseases: Secondary | ICD-10-CM | POA: Insufficient documentation

## 2012-09-20 DIAGNOSIS — Y939 Activity, unspecified: Secondary | ICD-10-CM | POA: Insufficient documentation

## 2012-09-20 DIAGNOSIS — Z8659 Personal history of other mental and behavioral disorders: Secondary | ICD-10-CM | POA: Insufficient documentation

## 2012-09-20 DIAGNOSIS — X500XXA Overexertion from strenuous movement or load, initial encounter: Secondary | ICD-10-CM | POA: Insufficient documentation

## 2012-09-20 DIAGNOSIS — F3289 Other specified depressive episodes: Secondary | ICD-10-CM | POA: Insufficient documentation

## 2012-09-20 DIAGNOSIS — R109 Unspecified abdominal pain: Secondary | ICD-10-CM | POA: Insufficient documentation

## 2012-09-20 DIAGNOSIS — F17200 Nicotine dependence, unspecified, uncomplicated: Secondary | ICD-10-CM | POA: Insufficient documentation

## 2012-09-20 DIAGNOSIS — N949 Unspecified condition associated with female genital organs and menstrual cycle: Secondary | ICD-10-CM | POA: Insufficient documentation

## 2012-09-20 LAB — WET PREP, GENITAL: Yeast Wet Prep HPF POC: NONE SEEN

## 2012-09-20 MED ORDER — CEFTRIAXONE SODIUM 250 MG IJ SOLR
250.0000 mg | Freq: Once | INTRAMUSCULAR | Status: AC
  Administered 2012-09-20: 250 mg via INTRAMUSCULAR
  Filled 2012-09-20: qty 250

## 2012-09-20 MED ORDER — LIDOCAINE HCL (PF) 1 % IJ SOLN
INTRAMUSCULAR | Status: AC
  Administered 2012-09-20: 0.9 mL
  Filled 2012-09-20: qty 5

## 2012-09-20 MED ORDER — IBUPROFEN 600 MG PO TABS: 600.0000 mg | ORAL_TABLET | Freq: Four times a day (QID) | ORAL | Status: DC | PRN

## 2012-09-20 MED ORDER — DOXYCYCLINE HYCLATE 100 MG PO CAPS: 100.0000 mg | ORAL_CAPSULE | Freq: Two times a day (BID) | ORAL | Status: DC

## 2012-09-20 MED ORDER — METRONIDAZOLE 500 MG PO TABS: 500.0000 mg | ORAL_TABLET | Freq: Two times a day (BID) | ORAL | Status: DC
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
## 2012-09-21 LAB — GC/CHLAMYDIA PROBE AMP
CT Probe RNA: NEGATIVE
GC Probe RNA: NEGATIVE

## 2012-12-25 ENCOUNTER — Emergency Department (HOSPITAL_COMMUNITY)
Admission: EM | Admit: 2012-12-25 | Discharge: 2012-12-25 | Disposition: A | Discharge status: Home / Self Care | Payer: Self-pay | Attending: Emergency Medicine | Admitting: Emergency Medicine

## 2012-12-25 ENCOUNTER — Encounter (HOSPITAL_COMMUNITY): Payer: Self-pay | Admitting: *Deleted

## 2012-12-25 DIAGNOSIS — F17200 Nicotine dependence, unspecified, uncomplicated: Secondary | ICD-10-CM | POA: Insufficient documentation

## 2012-12-25 DIAGNOSIS — F329 Major depressive disorder, single episode, unspecified: Secondary | ICD-10-CM | POA: Insufficient documentation

## 2012-12-25 DIAGNOSIS — Z8719 Personal history of other diseases of the digestive system: Secondary | ICD-10-CM | POA: Insufficient documentation

## 2012-12-25 DIAGNOSIS — F1410 Cocaine abuse, uncomplicated: Secondary | ICD-10-CM | POA: Insufficient documentation

## 2012-12-25 DIAGNOSIS — Z3202 Encounter for pregnancy test, result negative: Secondary | ICD-10-CM | POA: Insufficient documentation

## 2012-12-25 DIAGNOSIS — R300 Dysuria: Secondary | ICD-10-CM | POA: Insufficient documentation

## 2012-12-25 DIAGNOSIS — R350 Frequency of micturition: Secondary | ICD-10-CM | POA: Insufficient documentation

## 2012-12-25 DIAGNOSIS — F3289 Other specified depressive episodes: Secondary | ICD-10-CM | POA: Insufficient documentation

## 2012-12-25 DIAGNOSIS — N390 Urinary tract infection, site not specified: Secondary | ICD-10-CM | POA: Insufficient documentation

## 2012-12-25 DIAGNOSIS — I10 Essential (primary) hypertension: Secondary | ICD-10-CM | POA: Insufficient documentation

## 2012-12-25 DIAGNOSIS — Z79899 Other long term (current) drug therapy: Secondary | ICD-10-CM | POA: Insufficient documentation

## 2012-12-25 DIAGNOSIS — N920 Excessive and frequent menstruation with regular cycle: Secondary | ICD-10-CM | POA: Insufficient documentation

## 2012-12-25 DIAGNOSIS — F1210 Cannabis abuse, uncomplicated: Secondary | ICD-10-CM | POA: Insufficient documentation

## 2012-12-25 DIAGNOSIS — F411 Generalized anxiety disorder: Secondary | ICD-10-CM | POA: Insufficient documentation

## 2012-12-25 LAB — COMPREHENSIVE METABOLIC PANEL
ALT: 23 U/L (ref 0–35)
AST: 22 U/L (ref 0–37)
Albumin: 3.6 g/dL (ref 3.5–5.2)
Alkaline Phosphatase: 57 U/L (ref 39–117)
BUN: 9 mg/dL (ref 6–23)
CO2: 25 mEq/L (ref 19–32)
Calcium: 9 mg/dL (ref 8.4–10.5)
Chloride: 107 mEq/L (ref 96–112)
Creatinine, Ser: 0.81 mg/dL (ref 0.50–1.10)
GFR calc Af Amer: >90 mL/min (ref >90)
GFR calc non Af Amer: >90 mL/min (ref >90)
Glucose, Bld: 94 mg/dL (ref 70–99)
Potassium: 4.1 mEq/L (ref 3.5–5.1)
Sodium: 140 mEq/L (ref 135–145)
Total Bilirubin: 0.4 mg/dL (ref 0.3–1.2)
Total Protein: 7.6 g/dL (ref 6.0–8.3)

## 2012-12-25 LAB — CBC WITH DIFFERENTIAL/PLATELET
Basophils Absolute: 0 10*3/uL (ref 0.0–0.1)
Basophils Relative: 1 % (ref 0–1)
Eosinophils Absolute: 0.3 10*3/uL (ref 0.0–0.7)
Eosinophils Relative: 5 % (ref 0–5)
HCT: 40.2 % (ref 36.0–46.0)
Hemoglobin: 14.1 g/dL (ref 12.0–15.0)
Lymphocytes Relative: 46 % (ref 12–46)
Lymphs Abs: 2.3 10*3/uL (ref 0.7–4.0)
MCH: 35.4 pg — ABNORMAL HIGH (ref 26.0–34.0)
MCHC: 35.1 g/dL (ref 30.0–36.0)
MCV: 101 fL — ABNORMAL HIGH (ref 78.0–100.0)
Monocytes Absolute: 0.3 10*3/uL (ref 0.1–1.0)
Monocytes Relative: 6 % (ref 3–12)
Neutro Abs: 2.1 10*3/uL (ref 1.7–7.7)
Neutrophils Relative %: 43 % (ref 43–77)
Platelets: 128 10*3/uL — ABNORMAL LOW (ref 150–400)
RBC: 3.98 MIL/uL (ref 3.87–5.11)
RDW: 12.8 % (ref 11.5–15.5)
WBC: 5 10*3/uL (ref 4.0–10.5)

## 2012-12-25 LAB — URINALYSIS, ROUTINE W REFLEX MICROSCOPIC
Bilirubin Urine: NEGATIVE
Glucose, UA: NEGATIVE mg/dL
Ketones, ur: NEGATIVE mg/dL
Nitrite: POSITIVE — AB
Protein, ur: 100 mg/dL — AB
Specific Gravity, Urine: 1.019 (ref 1.005–1.030)
Urobilinogen, UA: 1 mg/dL (ref 0.0–1.0)
pH: 7 (ref 5.0–8.0)

## 2012-12-25 LAB — URINE MICROSCOPIC-ADD ON

## 2012-12-25 LAB — POCT PREGNANCY, URINE: Preg Test, Ur: NEGATIVE

## 2012-12-25 MED ORDER — NAPROXEN 500 MG PO TABS: 500.0000 mg | ORAL_TABLET | Freq: Two times a day (BID) | ORAL | Status: DC

## 2012-12-25 MED ORDER — NITROFURANTOIN MONOHYD MACRO 100 MG PO CAPS: 100.0000 mg | ORAL_CAPSULE | Freq: Two times a day (BID) | ORAL | Status: DC

## 2012-12-25 NOTE — ED Notes (Signed)
[name], [name] at bedside

## 2012-12-25 NOTE — ED Provider Notes (Signed)
History     CSN: [id number]  Arrival date & time 12/25/12  1421   First MD Initiated Contact with Patient 12/25/12 1717      Chief Complaint  Patient presents with  . Vaginal Bleeding    (Consider location/radiation/quality/duration/timing/severity/associated sxs/prior treatment) HPI [name] is a 42 y.o. female with a history of hypertension, depression,polysubstance abuse (cocaine and marijuana) and hepatitis presents emergency department complaining of heavy vaginal bleeding. Onset of symptoms began 2 days ago and are described as lower abdominal cramping with blood clots. Patient states the abdominal cramping is not very significant and that she is concerned she is having a miscarriage because of the blood clots. Patient's last menstrual period was 12/23/2012. Her last period was normal.associated symptoms include increased urinary frequency and dysuria of which patient has been treating with peridium.  Patient denies any dyspareunia, back pain, nausea, vomiting, fevers, night sweats, chills, lightheadedness, syncope,easy bruising,vaginal discharge.   Past Medical History  Diagnosis Date  . Hepatitis   . Hypertension   . Anxiety   . Depression     History reviewed. No pertinent past surgical history.  No family history on file.  History  Substance Use Topics  . Smoking status: Current Every Day Smoker  . Smokeless tobacco: Not on file  . Alcohol Use: Yes    OB History   Grav Para Term Preterm Abortions TAB SAB Ect Mult Living                  Review of Systems  All other systems reviewed and are negative.    Allergies  Review of patient's allergies indicates no known allergies.  Home Medications   Current Outpatient Rx  Name  Route  Sig  Dispense  Refill  . benztropine (COGENTIN) 1 MG tablet   Oral   Take 1 mg by mouth 2 (two) times daily.         . QUEtiapine (SEROQUEL XR) 300 MG 24 hr tablet   Oral   Take 300 mg by mouth at bedtime.          . risperiDONE (RISPERDAL) 3 MG tablet   Oral   Take 6 mg by mouth at bedtime.         . naproxen (NAPROSYN) 500 MG tablet   Oral   Take 1 tablet (500 mg total) by mouth 2 (two) times daily.   30 tablet   0   . nitrofurantoin, macrocrystal-monohydrate, (MACROBID) 100 MG capsule   Oral   Take 1 capsule (100 mg total) by mouth 2 (two) times daily.   10 capsule   0     BP 125/58  Pulse 65  Temp(Src) 99 F (37.2 C) (Oral)  Resp 18  SpO2 98%  LMP 12/23/2012  Physical Exam  Nursing note and vitals reviewed. Constitutional: She is oriented to person, place, and time. She appears well-developed and well-nourished. No distress.  HENT:  Head: Normocephalic and atraumatic.  Eyes: Conjunctivae and EOM are normal.  Neck: Normal range of motion.  Pulmonary/Chest: Effort normal.  Abdominal:  Soft abdomen with mild lower abdominal tenderness to palpation.  No peritoneal signs  Genitourinary:  No cva tenderness. Pelvic exam deferred  Musculoskeletal: Normal range of motion.  Neurological: She is alert and oriented to person, place, and time.  Skin: Skin is warm and dry. No rash noted. She is not diaphoretic.  Psychiatric: She has a normal mood and affect. Her behavior is normal.    ED Course  Procedures (including critical care time)  Labs Reviewed  CBC WITH DIFFERENTIAL - Abnormal; Notable for the following:    MCV 101.0 (*)    MCH 35.4 (*)    Platelets 128 (*)    All other components within normal limits  URINALYSIS, ROUTINE W REFLEX MICROSCOPIC - Abnormal; Notable for the following:    Color, Urine RED (*)    APPearance CLOUDY (*)    Hgb urine dipstick LARGE (*)    Protein, ur 100 (*)    Nitrite POSITIVE (*)    Leukocytes, UA SMALL (*)    All other components within normal limits  URINE MICROSCOPIC-ADD ON - Abnormal; Notable for the following:    Squamous Epithelial / LPF FEW (*)    Bacteria, UA MANY (*)    All other components within normal limits  URINE CULTURE   COMPREHENSIVE METABOLIC PANEL  POCT PREGNANCY, URINE   No results found.   1. Urinary tract infection   2. Menorrhagia     BP 125/58  Pulse 65  Temp(Src) 99 F (37.2 C) (Oral)  Resp 18  SpO2 98%  LMP 12/23/2012   MDM  42 year old female to emergency Department complaining of dysuria, urinary frequency, lower abdominal cramping and vaginal bleeding.  Patient is currently on her menstrual cycle which is on time as Patient's last menstrual period was 12/23/2012. labs reviewed with normal hemoglobin, normal vital signs and a positive urinary tract infection.  We'll treat with Macrobid and advise followup with OB/GYN for menorrhagia.  At this time there does not appear to be any evidence of an acute emergency medical condition and the patient appears stable for discharge with appropriate outpatient follow up.Diagnosis was discussed with patient who verbalizes understanding and is agreeable to discharge.         Jaci Carrel, [place] 12/25/12 1800

## 2012-12-25 NOTE — ED Notes (Signed)
Pt is here with early heavy vaginal bleeding that started on the 1st with clots and pt is hurting in lower abdomen. Pt states she had menstrual cycle last month.VSS.

## 2012-12-26 NOTE — ED Provider Notes (Signed)
Medical screening examination/treatment/procedure(s) were performed by non-physician practitioner and as supervising physician I was immediately available for consultation/collaboration.  Murad Staples L Andren Bethea, MD 12/26/12 1753 

## 2012-12-28 LAB — URINE CULTURE

## 2012-12-29 ENCOUNTER — Telehealth (HOSPITAL_COMMUNITY): Payer: Self-pay | Admitting: Emergency Medicine

## 2012-12-29 NOTE — ED Notes (Signed)
Post ED Visit - Positive Culture Follow-up  Culture report reviewed by antimicrobial stewardship pharmacist: [x]  Wes Dulaney, Pharm.D., BCPS []  Celedonio Miyamoto, Pharm.D., BCPS []  Georgina Pillion, Pharm.D., BCPS []  [place], [place].D., BCPS, AAHIVP []  Estella Husk, Pharm.D., BCPS, AAHIVP  Positive urine culture Treated with Macrobid, organism sensitive to the same and no further patient follow-up is required at this time.  Kylie A Holland 12/29/2012, 4:20 PM

## 2013-04-26 ENCOUNTER — Encounter (HOSPITAL_COMMUNITY): Payer: Self-pay | Admitting: Emergency Medicine

## 2013-04-26 ENCOUNTER — Emergency Department (HOSPITAL_COMMUNITY)
Admission: EM | Admit: 2013-04-26 | Discharge: 2013-04-27 | Disposition: A | Discharge status: Other Acute Inpatient Hospital | Payer: PRIVATE HEALTH INSURANCE | Attending: Emergency Medicine | Admitting: Emergency Medicine

## 2013-04-26 DIAGNOSIS — Z8719 Personal history of other diseases of the digestive system: Secondary | ICD-10-CM | POA: Insufficient documentation

## 2013-04-26 DIAGNOSIS — I10 Essential (primary) hypertension: Secondary | ICD-10-CM | POA: Insufficient documentation

## 2013-04-26 DIAGNOSIS — F1210 Cannabis abuse, uncomplicated: Secondary | ICD-10-CM | POA: Insufficient documentation

## 2013-04-26 DIAGNOSIS — F17200 Nicotine dependence, unspecified, uncomplicated: Secondary | ICD-10-CM | POA: Insufficient documentation

## 2013-04-26 DIAGNOSIS — F411 Generalized anxiety disorder: Secondary | ICD-10-CM | POA: Insufficient documentation

## 2013-04-26 DIAGNOSIS — F329 Major depressive disorder, single episode, unspecified: Secondary | ICD-10-CM | POA: Insufficient documentation

## 2013-04-26 DIAGNOSIS — Z3202 Encounter for pregnancy test, result negative: Secondary | ICD-10-CM | POA: Insufficient documentation

## 2013-04-26 DIAGNOSIS — R45851 Suicidal ideations: Secondary | ICD-10-CM | POA: Insufficient documentation

## 2013-04-26 DIAGNOSIS — R4589 Other symptoms and signs involving emotional state: Secondary | ICD-10-CM

## 2013-04-26 DIAGNOSIS — F259 Schizoaffective disorder, unspecified: Secondary | ICD-10-CM

## 2013-04-26 DIAGNOSIS — Z79899 Other long term (current) drug therapy: Secondary | ICD-10-CM | POA: Insufficient documentation

## 2013-04-26 DIAGNOSIS — F3289 Other specified depressive episodes: Secondary | ICD-10-CM | POA: Insufficient documentation

## 2013-04-26 LAB — URINALYSIS, ROUTINE W REFLEX MICROSCOPIC
Bilirubin Urine: NEGATIVE
Ketones, ur: NEGATIVE mg/dL
Nitrite: NEGATIVE
Protein, ur: NEGATIVE mg/dL
Urobilinogen, UA: 1 mg/dL (ref 0.0–1.0)

## 2013-04-26 LAB — RAPID URINE DRUG SCREEN, HOSP PERFORMED
Amphetamines: NOT DETECTED
Barbiturates: NOT DETECTED
Benzodiazepines: NOT DETECTED
Cocaine: NOT DETECTED
Tetrahydrocannabinol: POSITIVE — AB

## 2013-04-26 LAB — CBC WITH DIFFERENTIAL/PLATELET
Basophils Relative: 0 % (ref 0–1)
HCT: 43 % (ref 36.0–46.0)
Hemoglobin: 14.8 g/dL (ref 12.0–15.0)
Lymphs Abs: 2.5 10*3/uL (ref 0.7–4.0)
MCH: 35.2 pg — ABNORMAL HIGH (ref 26.0–34.0)
MCHC: 34.4 g/dL (ref 30.0–36.0)
Monocytes Absolute: 0.7 10*3/uL (ref 0.1–1.0)
Monocytes Relative: 8 % (ref 3–12)
Neutro Abs: 5.9 10*3/uL (ref 1.7–7.7)
RBC: 4.2 MIL/uL (ref 3.87–5.11)

## 2013-04-26 LAB — BASIC METABOLIC PANEL
BUN: 9 mg/dL (ref 6–23)
Chloride: 107 mEq/L (ref 96–112)
Creatinine, Ser: 0.85 mg/dL (ref 0.50–1.10)
GFR calc Af Amer: >90 mL/min (ref >90)
Glucose, Bld: 78 mg/dL (ref 70–99)

## 2013-04-26 LAB — ACETAMINOPHEN LEVEL: Acetaminophen (Tylenol), Serum: <15 ug/mL (ref 10–30)

## 2013-04-26 LAB — URINE MICROSCOPIC-ADD ON

## 2013-04-26 LAB — SALICYLATE LEVEL: Salicylate Lvl: <2 mg/dL — ABNORMAL LOW (ref 2.8–20.0)

## 2013-04-26 MED ORDER — ACETAMINOPHEN 325 MG PO TABS
650.0000 mg | ORAL_TABLET | ORAL | Status: DC | PRN
  Filled 2013-04-26: qty 2

## 2013-04-26 MED ORDER — CEPHALEXIN 500 MG PO CAPS
500.0000 mg | ORAL_CAPSULE | Freq: Two times a day (BID) | ORAL | Status: DC
  Administered 2013-04-26 – 2013-04-27 (×2): 500 mg via ORAL
  Filled 2013-04-26 (×2): qty 1

## 2013-04-26 MED ORDER — ZIPRASIDONE MESYLATE 20 MG IM SOLR
20.0000 mg | Freq: Once | INTRAMUSCULAR | Status: AC
  Administered 2013-04-26: 20 mg via INTRAMUSCULAR
  Filled 2013-04-26: qty 20

## 2013-04-26 MED ORDER — IBUPROFEN 200 MG PO TABS
600.0000 mg | ORAL_TABLET | Freq: Three times a day (TID) | ORAL | Status: DC | PRN
  Administered 2013-04-27: 600 mg via ORAL
  Filled 2013-04-26 (×2): qty 3

## 2013-04-26 MED ORDER — ZOLPIDEM TARTRATE 5 MG PO TABS: 5.0000 mg | ORAL_TABLET | Freq: Every evening | ORAL | Status: DC | PRN

## 2013-04-26 MED ORDER — ALUM & MAG HYDROXIDE-SIMETH 200-200-20 MG/5ML PO SUSP
30.0000 mL | ORAL | Status: DC | PRN
  Filled 2013-04-26: qty 30

## 2013-04-26 MED ORDER — NICOTINE 21 MG/24HR TD PT24
21.0000 mg | MEDICATED_PATCH | Freq: Every day | TRANSDERMAL | Status: DC
  Administered 2013-04-26 – 2013-04-27 (×2): 21 mg via TRANSDERMAL
  Filled 2013-04-26 (×2): qty 1

## 2013-04-26 MED ORDER — LORAZEPAM 1 MG PO TABS
1.0000 mg | ORAL_TABLET | Freq: Three times a day (TID) | ORAL | Status: DC | PRN
  Filled 2013-04-26: qty 1

## 2013-04-26 MED ORDER — ONDANSETRON HCL 4 MG PO TABS: 4.0000 mg | ORAL_TABLET | Freq: Three times a day (TID) | ORAL | Status: DC | PRN

## 2013-04-26 MED ORDER — RISPERIDONE 2 MG PO TABS
6.0000 mg | ORAL_TABLET | Freq: Every day | ORAL | Status: DC
  Administered 2013-04-26: 6 mg via ORAL
  Filled 2013-04-26: qty 3
  Filled 2013-04-26: qty 2

## 2013-04-26 MED ORDER — QUETIAPINE FUMARATE ER 300 MG PO TB24
300.0000 mg | ORAL_TABLET | Freq: Every day | ORAL | Status: DC
  Administered 2013-04-26: 300 mg via ORAL
  Filled 2013-04-26 (×2): qty 1

## 2013-04-26 MED ORDER — BENZTROPINE MESYLATE 1 MG PO TABS
1.0000 mg | ORAL_TABLET | Freq: Two times a day (BID) | ORAL | Status: DC
  Administered 2013-04-26 – 2013-04-27 (×3): 1 mg via ORAL
  Filled 2013-04-26 (×4): qty 1

## 2013-04-26 NOTE — ED Notes (Signed)
Pt has one black tote bag, 1 paper sack with 25 packaged condoms, 1 black wallet with 1 [place] ID card, 2 social security cards for Essence Julian Reil and Dorise Hiss, pt's social security card, 1 [place] bank [phone] [employer], [employer] [phone], 1 [employer] Debit card-4102, 1 [employer] hotel key card, 1 Temporary [employer], 1 loose address book, 2 [place] Medicaid Cards/[place] and [employer], pictures of minor children, 1 EBT card.  1 safety plan. 1 discharge paperwork with Rx for Naprosyn and Macrobid.  [facility] paperwork. [facility] papers. Multiple ripped photographs ripped and taped back together. 1pair jeans, 1 pair jean shorts, 1 t-shirt, 1 sports bra, 1 pair teal underwear, 1 pair tennis shoes. 1 pink make-up bag, 1 toothbrush, 1 toothpaste, feminine hygiene products, 1 yellow toned ring with colored stone, 1 silver toned bracelet, 1 pair silver toned earrings with clear stones,silver toned ring with clear stone, 1 yellow toned ring with clear stones.  1 cell phone-[employer] with black case.  1 body spray, deodorant, 2 ink pens, 1 black scrunchy, 1 bottle rubbing alcohol, 1 black comb, 1 small pen knife, 1 small lotion, 11 pennies.
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
## 2013-04-26 NOTE — ED Notes (Signed)
Bed: WA11 Expected date:  Expected time:  Means of arrival:  Comments: 
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
## 2013-04-26 NOTE — Consult Note (Signed)
[facility] Face-to-Face Psychiatry Consult   Reason for Consult:  Suicidal Gesture-jump off the bridge Referring Physician:  EDP [name] is an 42 y.o. female.  Assessment: AXIS I:  Bipolar, Depressed and Schizoaffective Disorder AXIS II:  Deferred AXIS III:   Past Medical History  Diagnosis Date  . Hepatitis   . Hypertension   . Anxiety   . Depression    AXIS IV:  economic problems, housing problems, other psychosocial or environmental problems, problems related to social environment and problems with primary support group AXIS V:  11-20 some danger of hurting self or others possible OR occasionally fails to maintain minimal personal hygiene OR gross impairment in communication  Plan:  Recommend psychiatric Inpatient admission when medically cleared.  Subjective:   [name] is a 42 y.o. female patient admitted with SCHIZOAFFECTIVED/O, BIPOLAR DEPRESSED.  HPI:  Patient was brought in by [employer] under IVC after picking patient up from a parking Deck trying to Jump off the bridge.  This is one of her several ER visits for various reasons and her last psychiatric inpatient admission at our [facility].  Patient, during this interview was reluctant to speak to this Clinical research associate and Dr Ladona Ridgel.  She is still endorsing suicide and stated " I want to die"  When asked what her stressors are she refused to say but yelled out at  this writer" read my record"  Her affect is flat, she reports her mood as depressed but would not rate it.  She states her children live with their father and patient states she is homeless and has no job.  We will admit her to our inpatient 500 hall unit FOR SAFETY AND STABILITY.  HPI Elements:   Location:  [facility]. Quality:  SEVERE SHE WANTED TO JUMP OFF PARKING DECK 4TH FLOOR. Severity:  SEVERE.  Past Psychiatric History: Past Medical History  Diagnosis Date  . Hepatitis   . Hypertension   . Anxiety   . Depression     reports that she has been smoking.  She does not have any  smokeless tobacco history on file. She reports that  drinks alcohol. She reports that she uses illicit drugs (Cocaine and Marijuana). History reviewed. No pertinent family history.         Allergies:  No Known Allergies  ACT Assessment Complete:  Yes:    Educational Status    Risk to Self: Risk to self Is patient at risk for suicide?: Yes Substance abuse history and/or treatment for substance abuse?: Yes  Risk to Others:    Abuse:    Prior Inpatient Therapy:    Prior Outpatient Therapy:    Additional Information:                    Objective: Blood pressure 106/63, pulse 7, temperature 98.8 F (37.1 C), temperature source Oral, resp. rate 20, SpO2 99.00%.There is no weight on file to calculate BMI. Results for orders placed during the hospital encounter of 04/26/13 (from the past 72 hour(s))  CBC WITH DIFFERENTIAL     Status: Abnormal   Collection Time    04/26/13  5:25 AM      Result Value Range   WBC 9.2  4.0 - 10.5 K/uL   RBC 4.20  3.87 - 5.11 MIL/uL   Hemoglobin 14.8  12.0 - 15.0 g/dL   HCT 57.8  46.9 - 62.9 %   MCV 102.4 (*) 78.0 - 100.0 fL   MCH 35.2 (*) 26.0 - 34.0 pg  MCHC 34.4  30.0 - 36.0 g/dL   RDW 04.5  40.9 - 81.1 %   Platelets 145 (*) 150 - 400 K/uL   Neutrophils Relative % 64  43 - 77 %   Neutro Abs 5.9  1.7 - 7.7 K/uL   Lymphocytes Relative 27  12 - 46 %   Lymphs Abs 2.5  0.7 - 4.0 K/uL   Monocytes Relative 8  3 - 12 %   Monocytes Absolute 0.7  0.1 - 1.0 K/uL   Eosinophils Relative 1  0 - 5 %   Eosinophils Absolute 0.1  0.0 - 0.7 K/uL   Basophils Relative 0  0 - 1 %   Basophils Absolute 0.0  0.0 - 0.1 K/uL  BASIC METABOLIC PANEL     Status: Abnormal   Collection Time    04/26/13  5:25 AM      Result Value Range   Sodium 140  135 - 145 mEq/L   Potassium 4.7  3.5 - 5.1 mEq/L   Chloride 107  96 - 112 mEq/L   CO2 21  19 - 32 mEq/L   Glucose, Bld 78  70 - 99 mg/dL   BUN 9  6 - 23 mg/dL   Creatinine, Ser 9.14  0.50 - 1.10 mg/dL    Calcium 9.8  8.4 - 78.2 mg/dL   GFR calc non Af Amer 85 (*) >90 mL/min   GFR calc Af Amer >90  >90 mL/min   Comment: (NOTE)     The eGFR has been calculated using the CKD EPI equation.     This calculation has not been validated in all clinical situations.     eGFR's persistently <90 mL/min signify possible Chronic Kidney     Disease.  ETHANOL     Status: None   Collection Time    04/26/13  5:25 AM      Result Value Range   Alcohol, Ethyl (B) <11  0 - 11 mg/dL   Comment:            LOWEST DETECTABLE LIMIT FOR     SERUM ALCOHOL IS 11 mg/dL     FOR MEDICAL PURPOSES ONLY     Performed at [facility]  URINE RAPID DRUG SCREEN (HOSP PERFORMED)     Status: Abnormal   Collection Time    04/26/13  6:10 AM      Result Value Range   Opiates NONE DETECTED  NONE DETECTED   Cocaine NONE DETECTED  NONE DETECTED   Benzodiazepines NONE DETECTED  NONE DETECTED   Amphetamines NONE DETECTED  NONE DETECTED   Tetrahydrocannabinol POSITIVE (*) NONE DETECTED   Barbiturates NONE DETECTED  NONE DETECTED   Comment:            DRUG SCREEN FOR MEDICAL PURPOSES     ONLY.  IF CONFIRMATION IS NEEDED     FOR ANY PURPOSE, NOTIFY LAB     WITHIN 5 DAYS.                LOWEST DETECTABLE LIMITS     FOR URINE DRUG SCREEN     Drug Class       Cutoff (ng/mL)     Amphetamine      1000     Barbiturate      200     Benzodiazepine   200     Tricyclics       300     Opiates  300     Cocaine          300     THC              50  URINALYSIS, ROUTINE W REFLEX MICROSCOPIC     Status: Abnormal   Collection Time    04/26/13  6:10 AM      Result Value Range   Color, Urine YELLOW  YELLOW   APPearance CLOUDY (*) CLEAR   Specific Gravity, Urine 1.027  1.005 - 1.030   pH 5.5  5.0 - 8.0   Glucose, UA NEGATIVE  NEGATIVE mg/dL   Hgb urine dipstick TRACE (*) NEGATIVE   Bilirubin Urine NEGATIVE  NEGATIVE   Ketones, ur NEGATIVE  NEGATIVE mg/dL   Protein, ur NEGATIVE  NEGATIVE mg/dL   Urobilinogen, UA 1.0   0.0 - 1.0 mg/dL   Nitrite NEGATIVE  NEGATIVE   Leukocytes, UA LARGE (*) NEGATIVE  URINE MICROSCOPIC-ADD ON     Status: Abnormal   Collection Time    04/26/13  6:10 AM      Result Value Range   Squamous Epithelial / LPF FEW (*) RARE   WBC, UA 21-50  <3 WBC/hpf   RBC / HPF 0-2  <3 RBC/hpf   Bacteria, UA MANY (*) RARE   Urine-Other FEW YEAST    ACETAMINOPHEN LEVEL     Status: None   Collection Time    04/26/13  6:19 AM      Result Value Range   Acetaminophen (Tylenol), Serum <15.0  10 - 30 ug/mL   Comment:            THERAPEUTIC CONCENTRATIONS VARY     SIGNIFICANTLY. A RANGE OF 10-30     ug/mL MAY BE AN EFFECTIVE     CONCENTRATION FOR MANY PATIENTS.     HOWEVER, SOME ARE BEST TREATED     AT CONCENTRATIONS OUTSIDE THIS     RANGE.     ACETAMINOPHEN CONCENTRATIONS     >150 ug/mL AT 4 HOURS AFTER     INGESTION AND >50 ug/mL AT 12     HOURS AFTER INGESTION ARE     OFTEN ASSOCIATED WITH TOXIC     REACTIONS.  SALICYLATE LEVEL     Status: Abnormal   Collection Time    04/26/13  6:19 AM      Result Value Range   Salicylate Lvl <2.0 (*) 2.8 - 20.0 mg/dL  POCT PREGNANCY, URINE     Status: None   Collection Time    04/26/13  6:24 AM      Result Value Range   Preg Test, Ur NEGATIVE  NEGATIVE   Comment:            THE SENSITIVITY OF THIS     METHODOLOGY IS >24 mIU/mL   Labs are reviewed and are pertinent for UNREMARKABLE LABS, UDS positive for Marijuana  Current Facility-Administered Medications  Medication Dose Route Frequency Provider Last Rate Last Dose  . acetaminophen (TYLENOL) tablet 650 mg  650 mg Oral Q4H PRN Gwyneth Sprout, MD      . alum & mag hydroxide-simeth (MAALOX/MYLANTA) 200-200-20 MG/5ML suspension 30 mL  30 mL Oral PRN Gwyneth Sprout, MD      . benztropine (COGENTIN) tablet 1 mg  1 mg Oral BID Junius Argyle, MD   1 mg at 04/26/13 1102  . ibuprofen (ADVIL,MOTRIN) tablet 600 mg  600 mg Oral Q8H PRN Gwyneth Sprout, MD      . LORazepam (  ATIVAN) tablet 1 mg   1 mg Oral Q8H PRN Gwyneth Sprout, MD      . nicotine (NICODERM CQ - dosed in mg/24 hours) patch 21 mg  21 mg Transdermal Daily Gwyneth Sprout, MD   21 mg at 04/26/13 1005  . ondansetron (ZOFRAN) tablet 4 mg  4 mg Oral Q8H PRN Gwyneth Sprout, MD      . QUEtiapine (SEROQUEL XR) 24 hr tablet 300 mg  300 mg Oral QHS Junius Argyle, MD      . risperiDONE (RISPERDAL) tablet 6 mg  6 mg Oral QHS Junius Argyle, MD      . zolpidem (AMBIEN) tablet 5 mg  5 mg Oral QHS PRN Gwyneth Sprout, MD       Current Outpatient Prescriptions  Medication Sig Dispense Refill  . benztropine (COGENTIN) 1 MG tablet Take 1 mg by mouth 2 (two) times daily.      . QUEtiapine (SEROQUEL XR) 300 MG 24 hr tablet Take 300 mg by mouth at bedtime.      . risperiDONE (RISPERDAL) 3 MG tablet Take 6 mg by mouth at bedtime.      . [DISCONTINUED] lurasidone (LATUDA) 80 MG TABS Take 80 mg by mouth daily with breakfast.        . [DISCONTINUED] sertraline (ZOLOFT) 50 MG tablet Take 50 mg by mouth daily.          Psychiatric Specialty Exam:     Blood pressure 106/63, pulse 7, temperature 98.8 F (37.1 C), temperature source Oral, resp. rate 20, SpO2 99.00%.There is no weight on file to calculate BMI.  General Appearance: Casual  Eye Contact::  Absent  Speech:  Slow  Volume:  Decreased  Mood:  Depressed, Hopeless and Irritable  Affect:  Congruent  Thought Process:  Coherent  Orientation:  Full (Time, Place, and Person)  Thought Content:  refused to anser.  Suicidal Thoughts:  Yes.  with intent/plan  Homicidal Thoughts:  No  Memory:  not contributing, not answering  Judgement:  Poor  Insight:  Fair  Psychomotor Activity:  Normal  Concentration:  Fair  Recall:  NA  Akathisia:  NA  Handed:  Right  AIMS (if indicated):     Assets:  Desire for Improvement Financial Resources/Insurance Housing  Sleep:      Treatment Plan Summary:  Consult and face to face interview with Dr Ladona Ridgel Patient will be admitted to  our 500 hall unit for management of her mood We will restart her home medications. Daily contact with patient to assess and evaluate symptoms and progress in treatment Medication management  Earney Navy   PMHNP-BC 04/26/2013 12:20 PM

## 2013-04-26 NOTE — ED Notes (Signed)
POCT PREG resulted Neg. 

## 2013-04-26 NOTE — BH Assessment (Signed)
Patient accepted by MD Ladona Ridgel to [facility]. Attending MD Jonalagadda, bed assigned 500-2.

## 2013-04-26 NOTE — ED Notes (Signed)
Patient's belongings placed in locker 36.

## 2013-04-26 NOTE — Consult Note (Signed)
Note reviewed and agreed with  

## 2013-04-26 NOTE — ED Notes (Signed)
Patient presents sad, depressed, and defensive. Reports continuous suicidal thoughts to jump off building and command auditory hallucinations. Denies any pain. Awaiting to be transported to [facility].

## 2013-04-27 ENCOUNTER — Encounter (HOSPITAL_COMMUNITY): Payer: Self-pay

## 2013-04-27 ENCOUNTER — Inpatient Hospital Stay (HOSPITAL_COMMUNITY)
Admission: AD | Admit: 2013-04-27 | Discharge: 2013-05-06 | DRG: 885 | Disposition: A | Discharge status: Home / Self Care | Payer: No Typology Code available for payment source | Attending: Psychiatry | Admitting: Psychiatry

## 2013-04-27 DIAGNOSIS — F1910 Other psychoactive substance abuse, uncomplicated: Secondary | ICD-10-CM

## 2013-04-27 DIAGNOSIS — Z5900 Homelessness unspecified: Secondary | ICD-10-CM

## 2013-04-27 DIAGNOSIS — Z559 Problems related to education and literacy, unspecified: Secondary | ICD-10-CM

## 2013-04-27 DIAGNOSIS — F411 Generalized anxiety disorder: Secondary | ICD-10-CM | POA: Diagnosis not present

## 2013-04-27 DIAGNOSIS — Z79899 Other long term (current) drug therapy: Secondary | ICD-10-CM

## 2013-04-27 DIAGNOSIS — F1210 Cannabis abuse, uncomplicated: Secondary | ICD-10-CM | POA: Diagnosis present

## 2013-04-27 DIAGNOSIS — F309 Manic episode, unspecified: Principal | ICD-10-CM | POA: Diagnosis present

## 2013-04-27 DIAGNOSIS — IMO0002 Reserved for concepts with insufficient information to code with codable children: Secondary | ICD-10-CM

## 2013-04-27 DIAGNOSIS — F319 Bipolar disorder, unspecified: Secondary | ICD-10-CM

## 2013-04-27 DIAGNOSIS — R45851 Suicidal ideations: Secondary | ICD-10-CM

## 2013-04-27 DIAGNOSIS — F17200 Nicotine dependence, unspecified, uncomplicated: Secondary | ICD-10-CM | POA: Diagnosis present

## 2013-04-27 DIAGNOSIS — F3110 Bipolar disorder, current episode manic without psychotic features, unspecified: Secondary | ICD-10-CM | POA: Diagnosis present

## 2013-04-27 DIAGNOSIS — Z560 Unemployment, unspecified: Secondary | ICD-10-CM

## 2013-04-27 DIAGNOSIS — I10 Essential (primary) hypertension: Secondary | ICD-10-CM | POA: Diagnosis present

## 2013-04-27 DIAGNOSIS — F1410 Cocaine abuse, uncomplicated: Secondary | ICD-10-CM | POA: Diagnosis present

## 2013-04-27 MED ORDER — BENZTROPINE MESYLATE 1 MG PO TABS
1.0000 mg | ORAL_TABLET | Freq: Two times a day (BID) | ORAL | Status: DC
  Administered 2013-04-28 – 2013-05-06 (×17): 1 mg via ORAL
  Filled 2013-04-27 (×21): qty 1

## 2013-04-27 MED ORDER — QUETIAPINE FUMARATE ER 300 MG PO TB24
300.0000 mg | ORAL_TABLET | Freq: Every day | ORAL | Status: DC
  Administered 2013-04-27 – 2013-04-30 (×4): 300 mg via ORAL
  Filled 2013-04-27 (×7): qty 1

## 2013-04-27 MED ORDER — RISPERIDONE 3 MG PO TABS
6.0000 mg | ORAL_TABLET | Freq: Every day | ORAL | Status: DC
  Administered 2013-04-27: 6 mg via ORAL
  Filled 2013-04-27: qty 6
  Filled 2013-04-27: qty 2
  Filled 2013-04-27: qty 6
  Filled 2013-04-27 (×2): qty 2

## 2013-04-27 MED ORDER — ONDANSETRON HCL 4 MG PO TABS: 4.0000 mg | ORAL_TABLET | Freq: Three times a day (TID) | ORAL | Status: DC | PRN

## 2013-04-27 MED ORDER — ZOLPIDEM TARTRATE 5 MG PO TABS
5.0000 mg | ORAL_TABLET | Freq: Every evening | ORAL | Status: DC | PRN
  Administered 2013-04-27 – 2013-05-05 (×6): 5 mg via ORAL
  Filled 2013-04-27 (×6): qty 1

## 2013-04-27 MED ORDER — ALUM & MAG HYDROXIDE-SIMETH 200-200-20 MG/5ML PO SUSP: 30.0000 mL | ORAL | Status: DC | PRN

## 2013-04-27 MED ORDER — ACETAMINOPHEN 325 MG PO TABS: 650.0000 mg | ORAL_TABLET | Freq: Four times a day (QID) | ORAL | Status: DC | PRN

## 2013-04-27 MED ORDER — NICOTINE 21 MG/24HR TD PT24
21.0000 mg | MEDICATED_PATCH | Freq: Every day | TRANSDERMAL | Status: DC
  Administered 2013-04-28: 21 mg via TRANSDERMAL
  Filled 2013-04-27 (×4): qty 1

## 2013-04-27 MED ORDER — IBUPROFEN 600 MG PO TABS: 600.0000 mg | ORAL_TABLET | Freq: Three times a day (TID) | ORAL | Status: DC | PRN

## 2013-04-27 MED ORDER — LORAZEPAM 1 MG PO TABS
1.0000 mg | ORAL_TABLET | Freq: Three times a day (TID) | ORAL | Status: DC | PRN
  Administered 2013-04-27 – 2013-05-02 (×6): 1 mg via ORAL
  Filled 2013-04-27 (×7): qty 1

## 2013-04-27 MED ORDER — MAGNESIUM HYDROXIDE 400 MG/5ML PO SUSP: 30.0000 mL | Freq: Every day | ORAL | Status: DC | PRN

## 2013-04-27 NOTE — Progress Notes (Signed)
Writer entered patients room and observed her lying in bed resting and appeared asleep but was easily aroused when her name was called. Writer asked if patient felt ok since not seeing her up in the dayroom and she reported that she was tired and sleepy since restarting her medications. Writer informed patient of hs meds scheduled and she is agreeable to taking them. Support and encouragement offered, patient currently denies si/hi/a/v hall. Safety maintained with 15 min checks, will continue to monitor.

## 2013-04-27 NOTE — ED Notes (Signed)
Josephine NP/Dr Jonnalagadda into [employer]

## 2013-04-27 NOTE — ED Notes (Signed)
CSW into see 

## 2013-04-27 NOTE — ED Provider Notes (Signed)
Pt accepted to [facility], will transfer stable.   Laray Anger, DO 04/27/13 1142

## 2013-04-27 NOTE — ED Notes (Signed)
IVC papers to be rescinded, pt to be admitted voluntarily

## 2013-04-27 NOTE — ED Notes (Signed)
Up tot he bathroom to shower and change scrubs 

## 2013-04-27 NOTE — ED Notes (Signed)
Pt given turkey sandwich and gingerale. 

## 2013-04-27 NOTE — ED Notes (Signed)
Unable to locate [facility] pharmacy sheet home meds were returned to pharmacy when the patient returned --pharmacy at [facility] unable to locate tramadol at the pharmacy here, unable to locate it in the patients belongings.  Pharmacy unable to contact pharmacy at [facility] to see if it is there, they will continue to attempt to contact them to see if the medication is there.

## 2013-04-27 NOTE — Tx Team (Signed)
Initial Interdisciplinary Treatment Plan  PATIENT STRENGTHS: (choose at least two) General fund of knowledge  PATIENT STRESSORS: Medication change or noncompliance Substance abuse   PROBLEM LIST: Problem List/Patient Goals Date to be addressed Date deferred Reason deferred Estimated date of resolution  Depression                                                        DISCHARGE CRITERIA:  Improved stabilization in mood, thinking, and/or behavior Motivation to continue treatment in a less acute level of care Reduction of life-threatening or endangering symptoms to within safe limits Verbal commitment to aftercare and medication compliance  PRELIMINARY DISCHARGE PLAN: Attend aftercare/continuing care group Outpatient therapy  PATIENT/FAMIILY INVOLVEMENT: This treatment plan has been presented to and reviewed with the patient, [name], and/or family member,.  The patient and family have been given the opportunity to ask questions and make suggestions.  Andrena Mews 04/27/2013, 2:28 PM

## 2013-04-27 NOTE — ED Notes (Signed)
3 bags of belongings sent w/ pt

## 2013-04-27 NOTE — Progress Notes (Addendum)
CSW met with pt to see if pt wanted voluntary admission to [facility]. Pt said she wanted to be admitted voluntarily and signed voluntary admit form and release of information. Pt signed necessary paperwork  CSW faxed admissions info to [facility].  Bed 508 #1, Dr. Shela Commons  EDP and RN aware.  [place] Spaniel [facility], [phone]     ED CSW  10:22AM 04/27/2013

## 2013-04-27 NOTE — ED Notes (Signed)
Pt ambulatory to [facility] w/ [facility] transport

## 2013-04-27 NOTE — ED Notes (Signed)
Eating sandwich, watching tv waiting for transport to [facility]

## 2013-04-27 NOTE — ED Notes (Signed)
[facility] here to transport 
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
## 2013-04-27 NOTE — Progress Notes (Signed)
Adult Psychoeducational Group Note  Date:  04/27/2013 Time:  8:00 pm  Group Topic/Focus:  Wrap-Up Group:   The focus of this group is to help patients review their daily goal of treatment and discuss progress on daily workbooks.  Participation Level:  Active  Participation Quality:  Appropriate and Sharing  Affect:  Appropriate  Cognitive:  Appropriate  Insight: Appropriate  Engagement in Group:  Engaged  Modes of Intervention:  Discussion, Education, Socialization and Support  Additional Comments: Pt stated that she was in the hospital for suicidal and homicidal ideation. Pt stated that she is constantly hearing voices. Pt. Stated that she is good at doing a hair and that she is a good mother when asked to identify two positive traits about himself.   Rosine Solecki 04/27/2013, 10:51 PM

## 2013-04-27 NOTE — ED Notes (Signed)
[facility] transport contacted for transfer

## 2013-04-28 DIAGNOSIS — R45851 Suicidal ideations: Secondary | ICD-10-CM

## 2013-04-28 DIAGNOSIS — F1010 Alcohol abuse, uncomplicated: Secondary | ICD-10-CM

## 2013-04-28 DIAGNOSIS — F1210 Cannabis abuse, uncomplicated: Secondary | ICD-10-CM

## 2013-04-28 LAB — URINE CULTURE: Colony Count: >=100000

## 2013-04-28 MED ORDER — NICOTINE POLACRILEX 2 MG MT GUM
2.0000 mg | CHEWING_GUM | OROMUCOSAL | Status: DC | PRN
  Administered 2013-04-28 – 2013-04-29 (×5): 2 mg via ORAL

## 2013-04-28 MED ORDER — DIVALPROEX SODIUM ER 500 MG PO TB24
500.0000 mg | ORAL_TABLET | Freq: Two times a day (BID) | ORAL | Status: DC
  Administered 2013-04-28 – 2013-05-02 (×8): 500 mg via ORAL
  Filled 2013-04-28 (×13): qty 1

## 2013-04-28 NOTE — [facility] Suicide Risk Assessment (Signed)
Suicide Risk Assessment  Admission Assessment     Nursing information obtained from:    Demographic factors:    Current Mental Status:    Loss Factors:    Historical Factors:    Risk Reduction Factors:     CLINICAL FACTORS:   Bipolar Disorder:   Depressive phase  COGNITIVE FEATURES THAT CONTRIBUTE TO RISK:  Polarized thinking    SUICIDE RISK:   Moderate:  Frequent suicidal ideation with limited intensity, and duration, some specificity in terms of plans, no associated intent, good self-control, limited dysphoria/symptomatology, some risk factors present, and identifiable protective factors, including available and accessible social support.  PLAN OF CARE:  I certify that inpatient services furnished can reasonably be expected to improve the patient's condition.  Ancil Linsey 04/28/2013, 12:35 PM
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
## 2013-04-28 NOTE — Clinical Social Work Note (Signed)
A change of clothing plus a sweater were provided to patient, as she had only one outfit with her.  Ambrose Mantle, LCSW 04/28/2013, 12:53 PM

## 2013-04-28 NOTE — Progress Notes (Signed)
Writer has observed patient up and active on the unit, laughing and joking. Patients boyfriend visited her this evening and she appeared happy after he left and has remained that way. Patient voiced no complaints, has been compliant with meds.Support and encouragement offered and safety maintained with 15 min checks, will continue to monitor.

## 2013-04-28 NOTE — [facility] Group Notes (Signed)
[facility] Group Notes:  (Nursing/MHT/Case Management/Adjunct)  Date:  04/28/2013  Time:  6:50 PM  Type of Therapy:  Psychoeducational Skills  Participation Level:  Did Not Attend   Buford Dresser 04/28/2013, 6:50 PM

## 2013-04-28 NOTE — Progress Notes (Signed)
Adult Psychoeducational Group Note  Date:  04/28/2013 Time:  8:00 pm  Group Topic/Focus:  Wrap-Up Group:   The focus of this group is to help patients review their daily goal of treatment and discuss progress on daily workbooks.  Participation Level:  Active  Participation Quality:  Sharing and Supportive  Affect:  Appropriate and Excited  Cognitive:  Appropriate  Insight: Good  Engagement in Group:  Engaged and Supportive  Modes of Intervention:  Discussion, Education, Socialization and Support  Additional Comments:  Pt stated that she is in the hospital for suicidal and homicidal ideation. Pt stated that she cries and uses positive self talk to assist with coping with mental illness.   [name] 04/28/2013, 11:58 PM

## 2013-04-28 NOTE — H&P (Signed)
NP assessment reviewed, and I concur with assessment and treatment plan.
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
## 2013-04-28 NOTE — Progress Notes (Signed)
Patient ID: [name], female   DOB: Jan 24, 1971, 42 y.o.   MRN: [id number] 04-28-13 @ 1630 nursing shift note: D: pt has been interacting with her peers and with staff in the milieu. She has been smiling and joking with all on the unit. She stated that her si is constant but she is able to contract for the si. She is taking her medications and not having any adverse effects. A: she has told staff that her needs are being met. Staff continue to support and encourage this patient. R: on her inventory sheet she wrote: she slept poor, appetite improving, energy low, attention improving with her depression at 8 and her hopelessness at 10. No w/d symptoms. Physical problems in the last 24 hrs have been dizziness,headache and pain, she stated. After discharge she plans to " try to keep my head. Get me somewhere to live", she stated. rn will monitor and q 15 cks continue. pts information is inconsistent with her behaviors.

## 2013-04-28 NOTE — [facility] Group Notes (Signed)
[facility] Group Notes: (Clinical Social Work)   04/28/2013      Type of Therapy:  Group Therapy   Participation Level:  Did Not Attend    Ambrose Mantle, LCSW 04/28/2013, 4:29 PM

## 2013-04-28 NOTE — Progress Notes (Signed)
Patient ID: [name], female   DOB: 1971-06-26, 42 y.o.   MRN: [id number] Psychoeducational Group Note  Date:  04/28/2013 Time:  0900am  Group Topic/Focus:  Making Healthy Choices:   The focus of this group is to help patients identify negative/unhealthy choices they were using prior to admission and identify positive/healthier coping strategies to replace them upon discharge.  Participation Level:  Active  Participation Quality:  Appropriate  Affect:  Appropriate  Cognitive:  Appropriate  Insight:  Supportive  Engagement in Group:  Supportive  Additional Comments:  Inventory group   Valente David 04/28/2013,11:49 AM

## 2013-04-29 DIAGNOSIS — R45850 Homicidal ideations: Secondary | ICD-10-CM

## 2013-04-29 DIAGNOSIS — F319 Bipolar disorder, unspecified: Secondary | ICD-10-CM

## 2013-04-29 MED ORDER — CEPHALEXIN 500 MG PO CAPS
500.0000 mg | ORAL_CAPSULE | Freq: Two times a day (BID) | ORAL | Status: AC
  Administered 2013-04-29 – 2013-05-04 (×12): 500 mg via ORAL
  Filled 2013-04-29 (×12): qty 1

## 2013-04-29 MED ORDER — GUAIFENESIN 100 MG/5ML PO SOLN
5.0000 mL | ORAL | Status: DC | PRN
  Administered 2013-04-29: 100 mg via ORAL

## 2013-04-29 MED ORDER — MENTHOL 3 MG MT LOZG
1.0000 | LOZENGE | OROMUCOSAL | Status: DC | PRN
  Administered 2013-04-29 – 2013-04-30 (×2): 3 mg via ORAL

## 2013-04-29 NOTE — [facility] Group Notes (Signed)
[facility] LCSW Aftercare Discharge Planning Group Note   04/29/2013 8:45 AM  Participation Quality:  Alert and Appropriate   Mood/Affect:  Appropriate, Flat and Depressed  Depression Rating:  10  Anxiety Rating:  5  Thoughts of Suicide:  Pt endorses SI and HI  Will you contract for safety?   Yes  Current AVH:  Pt denies  Plan for Discharge/Comments:  Pt attended discharge planning group and actively participated in group.  CSW provided pt with today's workbook.  Pt reports coming to the hospital for depression and SI.  Pt states that she stays with a friend in [place] and hopes she can return there upon d/c.  Pt states that she goes to [facility] the [place] for medication management and therapy.  No further needs voiced by pt at this time.    Transportation Means: Pt reports access to transportation - provided pt with a bus pass  Supports: No supports mentioned at this time  Reyes Ivan, LCSWA 04/29/2013 10:36 AM

## 2013-04-29 NOTE — Clinical Social Work Note (Signed)
CSW provided pt with a jacket from the clothes closet, per pt's request.  Pt than requested an additional shirt and pants.   Reyes Ivan, LCSWA 04/29/2013  2:28 PM

## 2013-04-29 NOTE — Progress Notes (Signed)
Adult Psychoeducational Group Note  Date:  04/29/2013 Time:  11:00am Group Topic/Focus:  Wellness Toolbox:   The focus of this group is to discuss various aspects of wellness, balancing those aspects and exploring ways to increase the ability to experience wellness.  Patients will create a wellness toolbox for use upon discharge.  Participation Level:  Active  Participation Quality:  Appropriate and Attentive  Affect:  Appropriate   Cognitive:  Alert and Appropriate  Insight: Appropriate  Engagement in Group:  Engaged  Modes of Intervention:  Discussion and Education  Additional Comments:  Pt attended and participated in group.When ask how she would rate her self care pt stated taking care of herself in every way. Pt rated her self care as a 8 she states the only thing wrong is her family bothering her.  Shelly Bombard D 04/29/2013, 1:19 PM

## 2013-04-29 NOTE — [facility] Group Notes (Signed)
[facility] LCSW Group Therapy  04/29/2013  1:15 PM   Type of Therapy:  Group Therapy  Participation Level:  Active  Participation Quality:  Appropriate and Attentive  Affect:  Appropriate, Tearful and Depressed  Cognitive:  Alert and Appropriate  Insight:  Developing/Improving and Engaged  Engagement in Therapy:  Developing/Improving and Engaged  Modes of Intervention:  Clarification, Confrontation, Discussion, Education, Exploration, Limit-setting, Orientation, Problem-solving, Rapport Building, Dance movement psychotherapist, Socialization and Support  Summary of Progress/Problems: Pt identified obstacles faced currently and processed barriers involved in overcoming these obstacles. Pt identified steps necessary for overcoming these obstacles and explored motivation (internal and external) for facing these difficulties head on. Pt further identified one area of concern in their lives and chose a goal to focus on for today.  Pt states that today she is struggling and focusing on how she ended up on the top of the parking deck to commit suicide.  Pt was able to process past attempts by overdose or cutting but is scared that she took it this far this time.  Pt was also able to process the lack of support she has from family and how it impacts her today.  Pt was tearful when discussing her relationship with her parents.  Pt appeared agitated but through sharing was more open and tearful, by the end of group.  Pt actively participated and was engaged in group discussion.    [name], [place] 04/29/2013 2:33 PM

## 2013-04-29 NOTE — Progress Notes (Addendum)
Pt c/o a,chest cough times two days with yellow sputum. Pa aware and ordered cough syrup and cough drops. Pt given 10cc of Robitussin and one cepacol lozenger. Pt instructed to let the nurse know if she is not feeling better. Pt does not appear short of breath. Lung sounds are tight throughout. Will continue to monitor temp. Pt denies SI or Hi and contracts for safety.Pt had a visitor in her room this pm and was found to be smoking. Pt attempted to put the cigarettes in her jean pockets but was seen doing this and finally gave the nurse the matches and the cigarettes that she and her visitor were smoking.

## 2013-04-29 NOTE — Progress Notes (Signed)
[facility] MD Progress Note  04/29/2013 4:30 PM [name]  MRN:  [id number]  Subjective:  Patient states she is "I'm here." She reports she is depressed rates her depression at a 9/10 and her anxiety as a 5/10. She is also reporting she has SI "all the time." States she hears voices "all the time." Also reports that she has HI towards her mother, " I want to burn the house down with her in it!" But states that "Myna Hidalgo is a "mother f*cker, and what goes around comes around!"  Diagnosis:   DSM5: Schizophrenia Disorders:   Obsessive-Compulsive Disorders:   Trauma-Stressor Disorders:   Substance/Addictive Disorders:  Hx of substance abuse Depressive Disorders:  Bipolar affective disorder ADL's:  Intact  Sleep: Fair  Appetite:  Fair  Suicidal Ideation:  Suicidal no plan no intent at this time can contract for safety. Homicidal Ideation:  Homicidal with plan but no intent and no means AEB (as evidenced by):  Psychiatric Specialty Exam: Review of Systems  Constitutional: Negative.  Negative for fever, chills, weight loss, malaise/fatigue and diaphoresis.  HENT: Negative for congestion and sore throat.   Eyes: Negative for blurred vision, double vision and photophobia.  Respiratory: Negative for cough, shortness of breath and wheezing.   Cardiovascular: Negative for chest pain, palpitations and PND.  Gastrointestinal: Negative for heartburn, nausea, vomiting, abdominal pain, diarrhea and constipation.  Musculoskeletal: Negative for myalgias, joint pain and falls.  Neurological: Negative for dizziness, tingling, tremors, sensory change, speech change, focal weakness, seizures, loss of consciousness, weakness and headaches.  Endo/Heme/Allergies: Negative for polydipsia. Does not bruise/bleed easily.  Psychiatric/Behavioral: Negative for depression, suicidal ideas, hallucinations, memory loss and substance abuse. The patient is not nervous/anxious and does not have insomnia.     Blood pressure  123/82, pulse 80, temperature 97.7 F (36.5 C), temperature source Oral, resp. rate 20, height 4\' 9"  (1.448 m), weight 77.111 kg (170 lb), SpO2 100.00%.Body mass index is 36.78 kg/(m^2).  General Appearance: Disheveled  Eye Solicitor::  Fair  Speech:  Clear and Coherent  Volume:  Increased  Mood:  Anxious and Depressed  Affect:  Congruent  Thought Process:  Circumstantial  Orientation:  Full (Time, Place, and Person)  Thought Content:  WDL  Suicidal Thoughts:  Yes.  without intent/plan  Homicidal Thoughts:  Yes.  without intent/plan  Memory:  NA  Judgement:  Impaired  Insight:  Lacking  Psychomotor Activity:  Normal  Concentration:  Fair  Recall:  Fair  Akathisia:  No  Handed:  Right  AIMS (if indicated):     Assets:  Desire for Improvement  Sleep:  Number of Hours: 6   Current Medications: Current Facility-Administered Medications  Medication Dose Route Frequency Provider Last Rate Last Dose  . acetaminophen (TYLENOL) tablet 650 mg  650 mg Oral Q6H PRN Earney Navy, NP      . alum & mag hydroxide-simeth (MAALOX/MYLANTA) 200-200-20 MG/5ML suspension 30 mL  30 mL Oral Q4H PRN Earney Navy, NP      . benztropine (COGENTIN) tablet 1 mg  1 mg Oral BID Nehemiah Settle, MD   1 mg at 04/29/13 0856  . cephALEXin (KEFLEX) capsule 500 mg  500 mg Oral Q12H Nehemiah Settle, MD   500 mg at 04/29/13 0904  . divalproex (DEPAKOTE ER) 24 hr tablet 500 mg  500 mg Oral BID Sanjuana Kava, NP   500 mg at 04/29/13 0856  . guaiFENesin (ROBITUSSIN) 100 MG/5ML solution 100 mg  5 mL Oral  Q4H PRN Verne Spurr, PA-C   100 mg at 04/29/13 1543  . ibuprofen (ADVIL,MOTRIN) tablet 600 mg  600 mg Oral Q8H PRN Earney Navy, NP      . LORazepam (ATIVAN) tablet 1 mg  1 mg Oral Q8H PRN Earney Navy, NP   1 mg at 04/29/13 1308  . magnesium hydroxide (MILK OF MAGNESIA) suspension 30 mL  30 mL Oral Daily PRN Earney Navy, NP      . menthol-cetylpyridinium (CEPACOL)  lozenge 3 mg  1 lozenge Oral PRN Verne Spurr, PA-C   3 mg at 04/29/13 1543  . nicotine polacrilex (NICORETTE) gum 2 mg  2 mg Oral PRN Sanjuana Kava, NP   2 mg at 04/29/13 1305  . ondansetron (ZOFRAN) tablet 4 mg  4 mg Oral Q8H PRN Earney Navy, NP      . QUEtiapine (SEROQUEL XR) 24 hr tablet 300 mg  300 mg Oral QHS Earney Navy, NP   300 mg at 04/28/13 2208  . zolpidem (AMBIEN) tablet 5 mg  5 mg Oral QHS PRN Earney Navy, NP   5 mg at 04/28/13 2208    Lab Results: No results found for this or any previous visit (from the past 48 hour(s)).  Physical Findings: AIMS: Facial and Oral Movements Muscles of Facial Expression: None, normal Lips and Perioral Area: None, normal Jaw: None, normal Tongue: None, normal,Extremity Movements Upper (arms, wrists, hands, fingers): None, normal Lower (legs, knees, ankles, toes): None, normal, Trunk Movements Neck, shoulders, hips: None, normal, Overall Severity Severity of abnormal movements (highest score from questions above): None, normal Incapacitation due to abnormal movements: None, normal Patient's awareness of abnormal movements (rate only patient's report): No Awareness, Dental Status Current problems with teeth and/or dentures?: No Does patient usually wear dentures?: No  CIWA:  CIWA-Ar Total: 1 COWS:     Treatment Plan Summary: Daily contact with patient to assess and evaluate symptoms and progress in treatment Medication management  Plan: 1. Continue crisis management and stabilization. 2. Medication management to reduce current symptoms to base line and improve patient's overall level of functioning 3. Treat health problems as indicated. 4. Develop treatment plan to decrease risk of relapse upon discharge and the need for readmission. 5. Psycho-social education regarding relapse prevention and self care. 6. Health care follow up as needed for medical problems. 7. Continue home medications where appropriate. 8.  ELOS: 3-5 days  Medical Decision Making Problem Points:  Established problem, stable/improving (1) Data Points:  Review or order medicine tests (1)  I certify that inpatient services furnished can reasonably be expected to improve the patient's condition.  Rona Ravens. [place] RPAC 4:44 PM 04/29/2013  Reviewed the information documented and agree with the treatment plan.  Evolet Salminen,JANARDHAHA R. 04/30/2013 12:28 PM

## 2013-04-29 NOTE — Progress Notes (Signed)
Recreation Therapy Notes  Date: 10.06.2014 Time: 3:00pm Location: [address] Dayroom  Group Topic: Wellness  Goal Area(s) Addresses:  Patient will verbalize benefit of whole wellness. Patient will identify at least two ways they are investing in each defined dimension of whole wellness.  Behavioral Response: Did not attend  Jearl Klinefelter, LRT/CTRS  Jearl Klinefelter 04/29/2013 3:59 PM

## 2013-04-29 NOTE — [facility] Suicide Risk Assessment (Deleted)
Suicide Risk Assessment  Discharge Assessment     Demographic Factors:  Female, Adolescent or young adult, Caucasian, Low socioeconomic status and Unemployed  Mental Status Per Nursing Assessment::   On Admission:     Current Mental Status by Physician: NA  Loss Factors: Decline in physical health and Financial problems/change in socioeconomic status  Historical Factors: Prior suicide attempts and Impulsivity  Risk Reduction Factors:   Sense of responsibility to family, Religious beliefs about death, Living with another person, especially a relative, Positive social support, Positive therapeutic relationship and Positive coping skills or problem solving skills  Continued Clinical Symptoms:  Bipolar Disorder:   Mixed State Previous Psychiatric Diagnoses and Treatments Medical Diagnoses and Treatments/Surgeries  Cognitive Features That Contribute To Risk:  Closed-mindedness Polarized thinking Thought constriction (tunnel vision)    Suicide Risk:  Minimal: No identifiable suicidal ideation.  Patients presenting with no risk factors but with morbid ruminations; may be classified as minimal risk based on the severity of the depressive symptoms  Discharge Diagnoses:   AXIS I:  Bipolar, Depressed AXIS II:  Deferred AXIS III:   Past Medical History  Diagnosis Date  . Hepatitis   . Hypertension   . Anxiety   . Depression    AXIS IV:  other psychosocial or environmental problems and problems related to social environment AXIS V:  61-70 mild symptoms  Plan Of Care/Follow-up recommendations:  Activity:  As tolerated Diet:  Regular  Is patient on multiple antipsychotic therapies at discharge:  No   Has Patient had three or more failed trials of antipsychotic monotherapy by history:  No  Recommended Plan for Multiple Antipsychotic Therapies: NA  Richetta Cubillos,JANARDHAHA R. 04/29/2013, 2:24 PM

## 2013-04-29 NOTE — Tx Team (Signed)
Interdisciplinary Treatment Plan Update (Adult)  Date: 04/29/2013  Time Reviewed:  9:45 AM  Progress in Treatment: Attending groups: Yes Participating in groups:  Yes Taking medication as prescribed:  Yes Tolerating medication:  Yes Family/Significant othe contact made: CSW assessing  Patient understands diagnosis:  Yes Discussing patient identified problems/goals with staff:  Yes Medical problems stabilized or resolved:  Yes Denies suicidal/homicidal ideation: No Issues/concerns per patient self-inventory:  Yes Other:  New problem(s) identified: N/A  Discharge Plan or Barriers: CSW assessing for appropriate referrals.  Reason for Continuation of Hospitalization: Anxiety Depression Suicidal Ideation Homicidal Ideation Medication Stabilization  Comments: N/A  Estimated length of stay: 3-5 days  For review of initial/current patient goals, please see plan of care.  Attendees: Patient:     Family:     Physician:  Dr. Javier Glazier 04/29/2013 10:39 AM   Nursing:   Neill Loft, RN 04/29/2013 10:39 AM   Clinical Social Worker:  Reyes Ivan, LCSWA 04/29/2013 10:39 AM   Other: Verne Spurr, PA 04/29/2013 10:39 AM   Other:  Frankey Shown, MA care coordination 04/29/2013 10:39 AM   Other:  Juline Patch, LCSW 04/29/2013 10:39 AM   Other:     Other:    Other:    Other:    Other:    Other:    Other:     Scribe for Treatment Team:   Carmina Miller, 04/29/2013 10:39 AM

## 2013-04-29 NOTE — Progress Notes (Signed)
Patient ID: [name], female   DOB: [birth year], 42 y.o.   MRN: [id number] D- Patient  Says she slept fair and her appetite is poor.  She rates her depression at 6 and she is having a lot of anxiety.  Patient took a bath and says this helps her feel better.  She is taking antibiotic for UTI and says it is "itchy".  She has some thoughts of self harm and contracts for safety.  She has been participating in groups and she has been interacting with peers and staff.

## 2013-04-30 MED ORDER — NICOTINE 14 MG/24HR TD PT24
14.0000 mg | MEDICATED_PATCH | Freq: Every day | TRANSDERMAL | Status: DC
  Administered 2013-04-30 – 2013-05-06 (×7): 14 mg via TRANSDERMAL
  Filled 2013-04-30 (×8): qty 1

## 2013-04-30 MED ORDER — FLUOXETINE HCL 20 MG PO CAPS
20.0000 mg | ORAL_CAPSULE | Freq: Every day | ORAL | Status: DC
  Administered 2013-04-30 – 2013-05-06 (×7): 20 mg via ORAL
  Filled 2013-04-30 (×9): qty 1

## 2013-04-30 NOTE — Progress Notes (Signed)
Adult Psychoeducational Group Note  Date:  04/30/2013 Time:  11:00am  Group Topic/Focus:  Recovery Goals:   The focus of this group is to identify appropriate goals for recovery and establish a plan to achieve them.  Participation Level:  Did Not Attend  Participation Quality:    Affect:    Cognitive:    Insight:   Engagement in Group:    Modes of Intervention:    Additional Comments:  Pt did not attend group  Shelly Bombard D 04/30/2013, 1:17 PM

## 2013-04-30 NOTE — Progress Notes (Signed)
Adult Psychoeducational Group Note  Date:  04/30/2013 Time:  0900am  Group Topic/Focus:  Orientation:   The focus of this group is to educate the patient on the purpose and policies of crisis stabilization and provide a format to answer questions about their admission.  The group details unit policies and expectations of patients while admitted.  Participation Level:  None  Participation Quality:  Inattentive  Affect:  Labile  Cognitive:  Lacking  Insight: None  Engagement in Group:  None  Modes of Intervention:  Discussion and Education  Additional Comments:  [name] came to group late, stayed in group room for approx 10 minutes, did not participate during that time.   Noah Charon 04/30/2013, 11:23 AM

## 2013-04-30 NOTE — [facility] Group Notes (Signed)
[facility] LCSW Group Therapy  04/30/2013  1:15 PM   Type of Therapy:  Group Therapy  Participation Level:  Active  Participation Quality:  Appropriate and Attentive  Affect:  Appropriate, Irritable  Cognitive:  Alert and Appropriate  Insight:  Developing/Improving and Engaged  Engagement in Therapy:  Developing/Improving and Engaged  Modes of Intervention:  Activity, Clarification, Confrontation, Discussion, Education, Exploration, Limit-setting, Orientation, Problem-solving, Rapport Building, Dance movement psychotherapist, Socialization and Support  Summary of Progress/Problems: Patient was attentive and engaged with speaker from [employer].  Patient was attentive to speaker while they shared their story of dealing with mental health and overcoming it.  Patient expressed interest in their programs and services and received information on their agency.  Patient processed ways they can relate to the speaker.   Pt came towards the end of the group and appeared irritated but observed to support a peer that was upset.    [name], LCSWA 04/30/2013 2:19 PM

## 2013-04-30 NOTE — Progress Notes (Signed)
Patient ID: [name], female   DOB: Dec 20, 1970, 42 y.o.   MRN: [id number] [facility] MD Progress Note  04/30/2013 10:49 AM [name]  MRN:  [id number]  Subjective:  Patient has been depressed, tearful and distressed about her homelessness, lack of support system. She reports she is depressed rates her depression at a 8/10 and her anxiety as a 6/10. She has SI "all the time." States she hears voices but she can not make sense of them any more. She is eating okay and has sleep disturbance. She has HI towards her mother, because she is not providing psychosocial support. Reportedly her mother was involved chaotic life while she is at home and now she is saying you are grown when she needs help.  Diagnosis:   DSM5: Schizophrenia Disorders:   Obsessive-Compulsive Disorders:   Trauma-Stressor Disorders:   Substance/Addictive Disorders:  Hx of substance abuse Depressive Disorders:  Bipolar affective disorder ADL's:  Intact  Sleep: Fair  Appetite:  Fair  Suicidal Ideation:  Suicidal no plan no intent at this time can contract for safety. Homicidal Ideation:  Homicidal with plan but no intent and no means AEB (as evidenced by):  Psychiatric Specialty Exam: Review of Systems  Constitutional: Negative.  Negative for fever, chills, weight loss, malaise/fatigue and diaphoresis.  HENT: Negative for congestion and sore throat.   Eyes: Negative for blurred vision, double vision and photophobia.  Respiratory: Negative for cough, shortness of breath and wheezing.   Cardiovascular: Negative for chest pain, palpitations and PND.  Gastrointestinal: Negative for heartburn, nausea, vomiting, abdominal pain, diarrhea and constipation.  Musculoskeletal: Negative for myalgias, joint pain and falls.  Neurological: Negative for dizziness, tingling, tremors, sensory change, speech change, focal weakness, seizures, loss of consciousness, weakness and headaches.  Endo/Heme/Allergies: Negative for polydipsia.  Does not bruise/bleed easily.  Psychiatric/Behavioral: Negative for depression, suicidal ideas, hallucinations, memory loss and substance abuse. The patient is not nervous/anxious and does not have insomnia.     Blood pressure 111/71, pulse 98, temperature 98.6 F (37 C), temperature source Oral, resp. rate 20, height 4\' 9"  (1.448 m), weight 77.111 kg (170 lb), SpO2 100.00%.Body mass index is 36.78 kg/(m^2).  General Appearance: Disheveled  Eye Solicitor::  Fair  Speech:  Clear and Coherent  Volume:  Increased  Mood:  Anxious and Depressed  Affect:  Congruent  Thought Process:  Circumstantial  Orientation:  Full (Time, Place, and Person)  Thought Content:  WDL  Suicidal Thoughts:  Yes.  without intent/plan  Homicidal Thoughts:  Yes.  without intent/plan  Memory:  NA  Judgement:  Impaired  Insight:  Lacking  Psychomotor Activity:  Normal  Concentration:  Fair  Recall:  Fair  Akathisia:  No  Handed:  Right  AIMS (if indicated):     Assets:  Desire for Improvement  Sleep:  Number of Hours: 6   Current Medications: Current Facility-Administered Medications  Medication Dose Route Frequency Provider Last Rate Last Dose  . acetaminophen (TYLENOL) tablet 650 mg  650 mg Oral Q6H PRN Earney Navy, NP      . alum & mag hydroxide-simeth (MAALOX/MYLANTA) 200-200-20 MG/5ML suspension 30 mL  30 mL Oral Q4H PRN Earney Navy, NP      . benztropine (COGENTIN) tablet 1 mg  1 mg Oral BID Nehemiah Settle, MD   1 mg at 04/30/13 0817  . cephALEXin (KEFLEX) capsule 500 mg  500 mg Oral Q12H Nehemiah Settle, MD   500 mg at 04/30/13 0817  .  divalproex (DEPAKOTE ER) 24 hr tablet 500 mg  500 mg Oral BID Sanjuana Kava, NP   500 mg at 04/30/13 0817  . guaiFENesin (ROBITUSSIN) 100 MG/5ML solution 100 mg  5 mL Oral Q4H PRN Verne Spurr, PA-C   100 mg at 04/29/13 1543  . ibuprofen (ADVIL,MOTRIN) tablet 600 mg  600 mg Oral Q8H PRN Earney Navy, NP      . LORazepam (ATIVAN)  tablet 1 mg  1 mg Oral Q8H PRN Earney Navy, NP   1 mg at 04/29/13 1308  . magnesium hydroxide (MILK OF MAGNESIA) suspension 30 mL  30 mL Oral Daily PRN Earney Navy, NP      . menthol-cetylpyridinium (CEPACOL) lozenge 3 mg  1 lozenge Oral PRN Verne Spurr, PA-C   3 mg at 04/30/13 0311  . nicotine polacrilex (NICORETTE) gum 2 mg  2 mg Oral PRN Sanjuana Kava, NP   2 mg at 04/29/13 1305  . ondansetron (ZOFRAN) tablet 4 mg  4 mg Oral Q8H PRN Earney Navy, NP      . QUEtiapine (SEROQUEL XR) 24 hr tablet 300 mg  300 mg Oral QHS Earney Navy, NP   300 mg at 04/29/13 2140  . zolpidem (AMBIEN) tablet 5 mg  5 mg Oral QHS PRN Earney Navy, NP   5 mg at 04/28/13 2208    Lab Results: No results found for this or any previous visit (from the past 48 hour(s)).  Physical Findings: AIMS: Facial and Oral Movements Muscles of Facial Expression: None, normal Lips and Perioral Area: None, normal Jaw: None, normal Tongue: None, normal,Extremity Movements Upper (arms, wrists, hands, fingers): None, normal Lower (legs, knees, ankles, toes): None, normal, Trunk Movements Neck, shoulders, hips: None, normal, Overall Severity Severity of abnormal movements (highest score from questions above): None, normal Incapacitation due to abnormal movements: None, normal Patient's awareness of abnormal movements (rate only patient's report): No Awareness, Dental Status Current problems with teeth and/or dentures?: No Does patient usually wear dentures?: No  CIWA:  CIWA-Ar Total: 1 COWS:     Treatment Plan Summary: Daily contact with patient to assess and evaluate symptoms and progress in treatment Medication management  Add Prozac 20 mg Po QD/ depression. Continue Depakote and Seroquel for mood stabilization.  Disposition in progress. Plan: 1. Continue crisis management and stabilization. 2. Medication management to reduce current symptoms to base line and improve patient's overall  level of functioning 3. Treat health problems as indicated. 4. Develop treatment plan to decrease risk of relapse upon discharge and the need for readmission. 5. Psycho-social education regarding relapse prevention and self care. 6. Health care follow up as needed for medical problems. 7. Continue home medications where appropriate. 8. ELOS: 3-5 days  Medical Decision Making Problem Points:  Established problem, stable/improving (1) Data Points:  Review or order medicine tests (1)  I certify that inpatient services furnished can reasonably be expected to improve the patient's condition.    Parisha Beaulac,JANARDHAHA R. 04/30/2013 10:52 AM

## 2013-04-30 NOTE — Progress Notes (Signed)
  D) Patient irritable at times  upon my assessment. Patient completed Patient Self Inventory, reports slept "poor," and  appetite is "improving." Patient rates depression as   8/10, patient rates hopeless feelings as  10/10. Patient denies HI, denies A/V hallucinations. Patient continues to endorse SI, contracts verbally for safety with RN.    A) Patient offered support and encouragement, patient encouraged to discuss feelings/concerns with staff. Patient verbalized understanding. Patient monitored Q15 minutes for safety. Patient met with MD  to discuss today's goals and plan of care.  R) Patient isolates to room at times, attending some groups in day room and most meals in dining room. Patient plans to "get my own place." Patient indicates "I need help getting my SSI and a place to stay."   Patient taking medications as ordered. Will continue to monitor.

## 2013-04-30 NOTE — Progress Notes (Signed)
Didn't attend group 

## 2013-04-30 NOTE — Progress Notes (Signed)
Patient in her room for most part of the evening. She was found smoking in her room with her boy friend. There was an order to restricted her for having visitors on the unit. Her mood and affect blunted and irritable, She denied SI/HI and denied hallucinations. She received her medications without problems. Q 15 minute check continues as ordered to maintain safety.

## 2013-04-30 NOTE — Progress Notes (Signed)
Recreation Therapy Notes  Date: 10.07.2014 Time: 2:30pm Location: [address] Dayroom  Group Topic: Animal Assisted Activities (AAA)  Behavioral Response: Engaged, Appropriate  Affect: Euthymic  Clinical Observations/Feedback: Dog Team: [place] & handler. Patient interacted appropriately with peer, dog team, LRT and MHT.   Malya Cirillo L Veera Stapleton, LRT/CTRS  Dymond Spreen L 04/30/2013 4:24 PM 

## 2013-05-01 MED ORDER — QUETIAPINE FUMARATE ER 300 MG PO TB24
300.0000 mg | ORAL_TABLET | Freq: Every day | ORAL | Status: DC
  Administered 2013-05-01: 300 mg via ORAL
  Filled 2013-05-01 (×3): qty 1

## 2013-05-01 NOTE — Progress Notes (Signed)
Adult Psychoeducational Group Note  Date:  05/01/2013 Time:  9:50 PM  Group Topic/Focus:  Wrap-Up Group:   The focus of this group is to help patients review their daily goal of treatment and discuss progress on daily workbooks.  Participation Level:  Active  Participation Quality:  Appropriate  Affect:  Appropriate  Cognitive:  Appropriate  Insight: Appropriate  Engagement in Group:  Engaged and Supportive  Modes of Intervention:  Support  Additional Comments:  Pt stated that she had a good time spending time with her peers. Stated that she stayed awake today and that she was able to tlk to her mom  [name] 05/01/2013, 9:50 PM

## 2013-05-01 NOTE — [facility] Group Notes (Signed)
[facility] LCSW Group Therapy  05/01/2013  1:15 PM   Type of Therapy:  Group Therapy  Participation Level:  Active  Participation Quality:  Appropriate and Attentive  Affect:  Appropriate, Irritable, Depressed  Cognitive:  Alert and Appropriate  Insight:  Developing/Improving and Engaged  Engagement in Therapy:  Developing/Improving and Engaged  Modes of Intervention:  Clarification, Confrontation, Discussion, Education, Exploration, Limit-setting, Orientation, Problem-solving, Rapport Building, Dance movement psychotherapist, Socialization and Support  Summary of Progress/Problems: The topic for group today was emotional regulation.  This group focused on both positive and negative emotion identification and allowed group members to process ways to identify feelings, regulate negative emotions, and find healthy ways to manage internal/external emotions. Group members were asked to reflect on a time when their reaction to an emotion led to a negative outcome and explored how alternative responses using emotion regulation would have benefited them. Group members were also asked to discuss a time when emotion regulation was utilized when a negative emotion was experienced.  Pt was able to process the anger she holds against her mother from her childhood and lack of support she feels from her today.  CSW discussed setting boundaries with this relationship, such as having minimal contact with her mother until she is able to regulate her emotions and now let her mother get to her, as she does.  Pt states that she wants to kill her mother but instead takes it out on herself.  Pt actively listened to discussion of negative coping skills vs positive coping skills, and pt was able to identify prayer and crying as more positive coping skills.  Pt appears to be holding on to this relationship with her mother, which is the root of her anger.  Pt actively participated and was engaged in group discussion.    [name],  LCSWA 05/01/2013 2:32 PM

## 2013-05-01 NOTE — Progress Notes (Signed)
Adult Psychoeducational Group Note  Date:  05/01/2013 Time:  11:00am  Group Topic/Focus:  Personal Choices and Values:   The focus of this group is to help patients assess and explore the importance of values in their lives, how their values affect their decisions, how they express their values and what opposes their expression.  Participation Level:  Minimal  Participation Quality:  Appropriate and Attentive  Affect:  Appropriate  Cognitive:  Appropriate  Insight: Limited  Engagement in Group:  Limited  Modes of Intervention:  Discussion and Education  Additional Comments:  Pt attended and participated in group. Discussion in group was about Personal development. Pt was ask what crisis were they in and what triggers were going on in their life? Pt stated her crisis was her mom and she is also her trigger.   Shelly Bombard D 05/01/2013, 2:08 PM

## 2013-05-01 NOTE — [facility] Suicide Risk Assessment (Signed)
[facility] INPATIENT:  Family/Significant Other Suicide Prevention Education  Suicide Prevention Education:  Education Completed; Marilyn Nihiser - mother [phone]),  (name of family member/significant other) has been identified by the patient as the family member/significant other with whom the patient will be residing, and identified as the person(s) who will aid the patient in the event of a mental health crisis (suicidal ideations/suicide attempt).  With written consent from the patient, the family member/significant other has been provided the following suicide prevention education, prior to the and/or following the discharge of the patient.  The suicide prevention education provided includes the following:  Suicide risk factors  Suicide prevention and interventions  National Suicide Hotline telephone number  [facility] assessment telephone number  [facility] Emergency Assistance 911  [facility] and/or Residential Mobile Crisis Unit telephone number  Request made of family/significant other to:  Remove weapons (e.g., guns, rifles, knives), all items previously/currently identified as safety concern.    Remove drugs/medications (over-the-counter, prescriptions, illicit drugs), all items previously/currently identified as a safety concern.  The family member/significant other verbalizes understanding of the suicide prevention education information provided.  The family member/significant other agrees to remove the items of safety concern listed above.  Carmina Miller 05/01/2013, 1:04 PM

## 2013-05-01 NOTE — Progress Notes (Signed)
Recreation Therapy Notes  Date: 10.08.2014 Time: 3:00pm  Location: [address] Dayroom  Group Topic: Primary: Safety, Secondary: Communication, Teamwork.  Goal Area(s) Addresses:  Patient will successfully work independently to determine survival needs. Patient with successfully work with peers to determine survival needs.   Patient will relate activity to personal safety.   Behavioral Response: Engaged, Appropriate   Intervention: Survival Scenario  Activity: Lost at [employer]. Patients were read a scenario about being lost at sea. As a group patients were asked to rank of 15 items provided for survival.   Education:  Administrator, arts, Communication, Team Work, Building control surveyor.  Education Outcome: Acknowledges understanding  Clinical Observations/Feedback: Patient actively participated in activity, voicing her opinion and debating with peers appropriately. Patient made no contributions to group discussion, but appeared to actively listen as she maintained appropriate eye contact with speaker.   Marykay Lex Aria Jarrard, LRT/CTRS  Ercell Perlman L 05/01/2013 4:04 PM

## 2013-05-01 NOTE — Progress Notes (Signed)
[facility] MD Progress Note  05/01/2013 12:40 PM KRITI KATAYAMA  MRN:  [id number]  Subjective:  Patient has been upset and angry about her mother not being supportive to her. Reportedly patient mother visited hospital and dropped an envelop with photos of her children. Patient has been compliant with her medications reportedly no adverse effects. Patient stated that she is trying to make relationship with her mother since her grandmother passed daily but her mother has been distant for her. Patient has been depressed and distressed about her homelessness,  And lack of support system. She rates her depression at 8/10 and her anxiety as a 9/10.  Patient endorses suicidal thoughts but contract for safety in the hospital. She hears voices but she can not make sense of them any more. She has no significant disturbance of sleep and appetite. She has stated that her mother is the only one she has at this time.   Diagnosis:   DSM5: Schizophrenia Disorders:   Obsessive-Compulsive Disorders:   Trauma-Stressor Disorders:   Substance/Addictive Disorders:  Hx of substance abuse  Depressive Disorders:  Bipolar affective disorder ADL's:  Intact  Sleep: Fair  Appetite:  Fair  Suicidal Ideation:  Suicidal no plan no intent at this time can contract for safety. Homicidal Ideation:  Homicidal with plan but no intent and no means AEB (as evidenced by):  Psychiatric Specialty Exam: Review of Systems  Constitutional: Negative.  Negative for fever, chills, weight loss, malaise/fatigue and diaphoresis.  HENT: Negative for congestion and sore throat.   Eyes: Negative for blurred vision, double vision and photophobia.  Respiratory: Negative for cough, shortness of breath and wheezing.   Cardiovascular: Negative for chest pain, palpitations and PND.  Gastrointestinal: Negative for heartburn, nausea, vomiting, abdominal pain, diarrhea and constipation.  Musculoskeletal: Negative for falls, joint pain and myalgias.   Neurological: Negative for dizziness, tingling, tremors, sensory change, speech change, focal weakness, seizures, loss of consciousness, weakness and headaches.  Endo/Heme/Allergies: Negative for polydipsia. Does not bruise/bleed easily.  Psychiatric/Behavioral: Negative for depression, suicidal ideas, hallucinations, memory loss and substance abuse. The patient is not nervous/anxious and does not have insomnia.     Blood pressure 113/73, pulse 96, temperature 98.2 F (36.8 C), temperature source Oral, resp. rate 16, height 4\' 9"  (1.448 m), weight 77.111 kg (170 lb), SpO2 100.00%.Body mass index is 36.78 kg/(m^2).  General Appearance: Disheveled  Eye Solicitor::  Fair  Speech:  Clear and Coherent  Volume:  Increased  Mood:  Anxious and Depressed  Affect:  Congruent  Thought Process:  Circumstantial  Orientation:  Full (Time, Place, and Person)  Thought Content:  WDL  Suicidal Thoughts:  Yes.  without intent/plan  Homicidal Thoughts:  Yes.  without intent/plan  Memory:  NA  Judgement:  Impaired  Insight:  Lacking  Psychomotor Activity:  Normal  Concentration:  Fair  Recall:  Fair  Akathisia:  No  Handed:  Right  AIMS (if indicated):     Assets:  Desire for Improvement  Sleep:  Number of Hours: 6   Current Medications: Current Facility-Administered Medications  Medication Dose Route Frequency Provider Last Rate Last Dose  . acetaminophen (TYLENOL) tablet 650 mg  650 mg Oral Q6H PRN Earney Navy, NP      . alum & mag hydroxide-simeth (MAALOX/MYLANTA) 200-200-20 MG/5ML suspension 30 mL  30 mL Oral Q4H PRN Earney Navy, NP      . benztropine (COGENTIN) tablet 1 mg  1 mg Oral BID Nehemiah Settle, MD  1 mg at 05/01/13 0739  . cephALEXin (KEFLEX) capsule 500 mg  500 mg Oral Q12H Nehemiah Settle, MD   500 mg at 05/01/13 0739  . divalproex (DEPAKOTE ER) 24 hr tablet 500 mg  500 mg Oral BID Sanjuana Kava, NP   500 mg at 05/01/13 0739  . FLUoxetine (PROZAC)  capsule 20 mg  20 mg Oral Daily Nehemiah Settle, MD   20 mg at 05/01/13 0739  . guaiFENesin (ROBITUSSIN) 100 MG/5ML solution 100 mg  5 mL Oral Q4H PRN Verne Spurr, PA-C   100 mg at 04/29/13 1543  . ibuprofen (ADVIL,MOTRIN) tablet 600 mg  600 mg Oral Q8H PRN Earney Navy, NP      . LORazepam (ATIVAN) tablet 1 mg  1 mg Oral Q8H PRN Earney Navy, NP   1 mg at 04/30/13 1439  . magnesium hydroxide (MILK OF MAGNESIA) suspension 30 mL  30 mL Oral Daily PRN Earney Navy, NP      . menthol-cetylpyridinium (CEPACOL) lozenge 3 mg  1 lozenge Oral PRN Verne Spurr, PA-C   3 mg at 04/30/13 0311  . nicotine (NICODERM CQ - dosed in mg/24 hours) patch 14 mg  14 mg Transdermal Daily Nehemiah Settle, MD   14 mg at 05/01/13 0641  . ondansetron (ZOFRAN) tablet 4 mg  4 mg Oral Q8H PRN Earney Navy, NP      . QUEtiapine (SEROQUEL XR) 24 hr tablet 300 mg  300 mg Oral QHS Nehemiah Settle, MD      . zolpidem (AMBIEN) tablet 5 mg  5 mg Oral QHS PRN Earney Navy, NP   5 mg at 05/01/13 0120    Lab Results: No results found for this or any previous visit (from the past 48 hour(s)).  Physical Findings: AIMS: Facial and Oral Movements Muscles of Facial Expression: None, normal Lips and Perioral Area: None, normal Jaw: None, normal Tongue: None, normal,Extremity Movements Upper (arms, wrists, hands, fingers): None, normal Lower (legs, knees, ankles, toes): None, normal, Trunk Movements Neck, shoulders, hips: None, normal, Overall Severity Severity of abnormal movements (highest score from questions above): None, normal Incapacitation due to abnormal movements: None, normal Patient's awareness of abnormal movements (rate only patient's report): No Awareness, Dental Status Current problems with teeth and/or dentures?: No Does patient usually wear dentures?: No  CIWA:  CIWA-Ar Total: 1 COWS:     Treatment Plan Summary: Daily contact with patient to assess  and evaluate symptoms and progress in treatment Medication management  Continue Prozac 20 mg Po QD/ depression. Continue Depakote  500 mg 2 times daily and Seroquel  300 mg at bedtime for mood stabilization.  Disposition in progress. Plan: 1. Continue crisis management and stabilization. 2. Medication management to reduce current symptoms to base line and improve patient's overall level of functioning 3. Treat health problems as indicated. 4. Develop treatment plan to decrease risk of relapse upon discharge and the need for readmission. 5. Psycho-social education regarding relapse prevention and self care. 6. Health care follow up as needed for medical problems. 7. Continue home medications where appropriate. 8. ELOS: 3-5 days  Medical Decision Making Problem Points:  Established problem, stable/improving (1) Data Points:  Review or order medicine tests (1)  I certify that inpatient services furnished can reasonably be expected to improve the patient's condition.    Kiyon Fidalgo,JANARDHAHA R. 05/01/2013 12:40 PM

## 2013-05-01 NOTE — Progress Notes (Signed)
Patient ID: [name], female   DOB: [birth year], 42 y.o.   MRN: [id number]  D: Patient lying in bed with eyes closed. Respirations even and non-labored. A: Staff will monitor on q 15 minute checks, follow treatment plan, and give meds as ordered. R: Appears to be sleeping.

## 2013-05-01 NOTE — [facility] Group Notes (Signed)
[facility] LCSW Aftercare Discharge Planning Group Note   05/01/2013  8:45 AM  Participation Quality:  Did Not Attend  Jalan Fariss Horton, LCSWA 05/01/2013 9:13 AM       

## 2013-05-01 NOTE — Progress Notes (Signed)
D: Pt continues to have passive SI , but is able to contract. Affect blunted & mood depressed.. Pt was very tearful earlier b/c she feels family are not there for her . Pt. Did attend the wrap-up group A: Continues on 15 minute checks; Supported & encouraged. R: Pt safety maintained.

## 2013-05-01 NOTE — Tx Team (Signed)
Interdisciplinary Treatment Plan Update (Adult)  Date: 05/01/2013  Time Reviewed:  9:45 AM  Progress in Treatment: Attending groups: Yes Participating in groups:  Yes Taking medication as prescribed:  Yes Tolerating medication:  Yes Family/Significant othe contact made: CSW attempting Patient understands diagnosis:  Yes Discussing patient identified problems/goals with staff:  Yes Medical problems stabilized or resolved:  Yes Denies suicidal/homicidal ideation: Yes Issues/concerns per patient self-inventory:  Yes Other:  New problem(s) identified: N/A  Discharge Plan or Barriers: Pt is scheduled to follow up at [facility] of the [place] for medication management and therapy.    Reason for Continuation of Hospitalization: Anxiety Depression Suicidal Ideation Medication Stabilization  Comments: N/A  Estimated length of stay: 2-3 days  For review of initial/current patient goals, please see plan of care.  Attendees: Patient:     Family:     Physician:  Dr. Javier Glazier 05/01/2013 10:28 AM   Nursing:   Lowella Grip, RN 05/01/2013 10:28 AM   Clinical Social Worker:  Reyes Ivan, LCSWA 05/01/2013 10:28 AM   Other: Nestor Ramp, RN 05/01/2013 10:28 AM   Other:  Tomasita Morrow, care coordination 05/01/2013 10:28 AM   Other:  Juline Patch, LCSW 05/01/2013 10:28 AM   Other:  Dellia Cloud, RN 05/01/2013 10:29 AM   Other:    Other:    Other:    Other:    Other:    Other:     Scribe for Treatment Team:   Carmina Miller, 05/01/2013 10:28 AM

## 2013-05-02 MED ORDER — QUETIAPINE FUMARATE ER 400 MG PO TB24
400.0000 mg | ORAL_TABLET | Freq: Every day | ORAL | Status: DC
  Administered 2013-05-02: 400 mg via ORAL
  Filled 2013-05-02 (×3): qty 1
  Filled 2013-05-02: qty 2

## 2013-05-02 MED ORDER — DIVALPROEX SODIUM ER 500 MG PO TB24
500.0000 mg | ORAL_TABLET | ORAL | Status: DC
  Administered 2013-05-03 – 2013-05-06 (×4): 500 mg via ORAL
  Filled 2013-05-02 (×6): qty 1

## 2013-05-02 MED ORDER — DIVALPROEX SODIUM ER 500 MG PO TB24
1000.0000 mg | ORAL_TABLET | Freq: Every day | ORAL | Status: DC
  Administered 2013-05-02 – 2013-05-05 (×4): 1000 mg via ORAL
  Filled 2013-05-02 (×5): qty 2

## 2013-05-02 NOTE — Progress Notes (Signed)
Patient ID: [name], female   DOB: 06-24-1971, 42 y.o.   MRN: [id number] D: Patient lying in bed with eyes closed. Respirations even and non-labored. A: Staff will continue to monitor on q 15 minute checks, follow treatment plan, and give meds as ordered. R: Appears to be sleeping

## 2013-05-02 NOTE — Progress Notes (Signed)
Patient ID: [name], female   DOB: [birth year], 42 y.o.   MRN: [id number] [facility] MD Progress Note  05/02/2013 4:46 PM [facility]A  MRN:  [id number]  Subjective:  Patient is seen today and is loud and cursing in the day room, threatens to curse out the provider if "this isn't for something good.[name].!" She is focused on her parents not being able to take care of her. She notes that she had poor sleep despite the increase in her seroquel to 300mg . She reports that she usually takes Seroquel 400mg  po BID as well as depakote. She reports that her depression is at a 9/10 and that her anxiety is as an 8/10. She is worried about where she will go when she is discharged. Diagnosis:   DSM5: Schizophrenia Disorders:   Obsessive-Compulsive Disorders:   Trauma-Stressor Disorders:   Substance/Addictive Disorders:  Hx of substance abuse  Depressive Disorders:  Bipolar affective disorder ADL's:  Intact  Sleep: Fair  Appetite:  Fair  Suicidal Ideation:  Suicidal no plan no intent at this time can contract for safety. Homicidal Ideation:  Homicidal with plan but no intent and no means AEB (as evidenced by):  Psychiatric Specialty Exam: Review of Systems  Constitutional: Negative.  Negative for fever, chills, weight loss, malaise/fatigue and diaphoresis.  HENT: Negative for congestion and sore throat.   Eyes: Negative for blurred vision, double vision and photophobia.  Respiratory: Negative for cough, shortness of breath and wheezing.   Cardiovascular: Negative for chest pain, palpitations and PND.  Gastrointestinal: Negative for heartburn, nausea, vomiting, abdominal pain, diarrhea and constipation.  Musculoskeletal: Negative for falls, joint pain and myalgias.  Neurological: Negative for dizziness, tingling, tremors, sensory change, speech change, focal weakness, seizures, loss of consciousness, weakness and headaches.  Endo/Heme/Allergies: Negative for polydipsia. Does not bruise/bleed easily.   Psychiatric/Behavioral: Negative for depression, suicidal ideas, hallucinations, memory loss and substance abuse. The patient is not nervous/anxious and does not have insomnia.     Blood pressure 133/96, pulse 166, temperature 98.3 F (36.8 C), temperature source Oral, resp. rate 18, height 5\' 6"  (1.676 m), weight 87.091 kg (192 lb), SpO2 100.00%.Body mass index is 31 kg/(m^2).  General Appearance: Disheveled  Eye Solicitor::  Fair  Speech:  Clear and Coherent  Volume:  Increased  Mood:  Anxious and Depressed  Affect:  Congruent  Thought Process:  Circumstantial  Orientation:  Full (Time, Place, and Person)  Thought Content:  WDL  Suicidal Thoughts:  Yes.  without intent/plan  Homicidal Thoughts:  Yes.  without intent/plan  Memory:  NA  Judgement:  Impaired  Insight:  Lacking  Psychomotor Activity:  Normal  Concentration:  Fair  Recall:  Fair  Akathisia:  No  Handed:  Right  AIMS (if indicated):     Assets:  Desire for Improvement  Sleep:  Number of Hours: 5   Current Medications: Current Facility-Administered Medications  Medication Dose Route Frequency Provider Last Rate Last Dose  . acetaminophen (TYLENOL) tablet 650 mg  650 mg Oral Q6H PRN Earney Navy, NP      . alum & mag hydroxide-simeth (MAALOX/MYLANTA) 200-200-20 MG/5ML suspension 30 mL  30 mL Oral Q4H PRN Earney Navy, NP      . benztropine (COGENTIN) tablet 1 mg  1 mg Oral BID Nehemiah Settle, MD   1 mg at 05/02/13 0749  . cephALEXin (KEFLEX) capsule 500 mg  500 mg Oral Q12H Nehemiah Settle, MD   500 mg at 05/02/13 0749  .  divalproex (DEPAKOTE ER) 24 hr tablet 500 mg  500 mg Oral BID Sanjuana Kava, NP   500 mg at 05/02/13 0749  . FLUoxetine (PROZAC) capsule 20 mg  20 mg Oral Daily Nehemiah Settle, MD   20 mg at 05/02/13 0749  . guaiFENesin (ROBITUSSIN) 100 MG/5ML solution 100 mg  5 mL Oral Q4H PRN Verne Spurr, PA-C   100 mg at 04/29/13 1543  . ibuprofen (ADVIL,MOTRIN) tablet  600 mg  600 mg Oral Q8H PRN Earney Navy, NP      . LORazepam (ATIVAN) tablet 1 mg  1 mg Oral Q8H PRN Earney Navy, NP   1 mg at 05/02/13 1036  . magnesium hydroxide (MILK OF MAGNESIA) suspension 30 mL  30 mL Oral Daily PRN Earney Navy, NP      . menthol-cetylpyridinium (CEPACOL) lozenge 3 mg  1 lozenge Oral PRN Verne Spurr, PA-C   3 mg at 04/30/13 0311  . nicotine (NICODERM CQ - dosed in mg/24 hours) patch 14 mg  14 mg Transdermal Daily Nehemiah Settle, MD   14 mg at 05/02/13 0750  . ondansetron (ZOFRAN) tablet 4 mg  4 mg Oral Q8H PRN Earney Navy, NP      . QUEtiapine (SEROQUEL XR) 24 hr tablet 300 mg  300 mg Oral QHS Nehemiah Settle, MD   300 mg at 05/01/13 2050  . zolpidem (AMBIEN) tablet 5 mg  5 mg Oral QHS PRN Earney Navy, NP   5 mg at 05/02/13 0020    Lab Results: No results found for this or any previous visit (from the past 48 hour(s)).  Physical Findings: AIMS: Facial and Oral Movements Muscles of Facial Expression: None, normal Lips and Perioral Area: None, normal Jaw: None, normal Tongue: None, normal,Extremity Movements Upper (arms, wrists, hands, fingers): None, normal Lower (legs, knees, ankles, toes): None, normal, Trunk Movements Neck, shoulders, hips: None, normal, Overall Severity Severity of abnormal movements (highest score from questions above): None, normal Incapacitation due to abnormal movements: None, normal Patient's awareness of abnormal movements (rate only patient's report): No Awareness, Dental Status Current problems with teeth and/or dentures?: No Does patient usually wear dentures?: No  CIWA:  CIWA-Ar Total: 1 COWS:     Treatment Plan Summary: Daily contact with patient to assess and evaluate symptoms and progress in treatment Medication management  Continue Prozac 20 mg Po QD/ depression. Increase Depakote to 1000mg  at HS. And continue 500mg  AM. Increase Seroquel to 400mg  at HS.  Order  depakote level for Saturday. Will consider risperdal if no changes in mood with increase in medications.  Disposition in progress. Plan: 1. Continue crisis management and stabilization. 2. Medication management to reduce current symptoms to base line and improve patient's overall level of functioning 3. Treat health problems as indicated. 4. Develop treatment plan to decrease risk of relapse upon discharge and the need for readmission. 5. Psycho-social education regarding relapse prevention and self care. 6. Health care follow up as needed for medical problems. 7. Continue home medications where appropriate. 8. ELOS: 3-5 days  Medical Decision Making Problem Points:  Established problem, stable/improving (1) Data Points:  Review or order medicine tests (1)  I certify that inpatient services furnished can reasonably be expected to improve the patient's condition.   Rona Ravens. Mashburn RPAC 4:50 PM 05/02/2013  Reviewed the information documented and agree with the treatment plan.  Janise Gora,JANARDHAHA R. 05/03/2013 11:39 PM

## 2013-05-02 NOTE — [facility] Group Notes (Signed)
The focus of this group is to educate the patient on the purpose and policies of crisis stabilization and provide a format to answer questions about their admission.  The group details unit policies and expectations of patients while admitted.  PT DID NOT ATTEND. 

## 2013-05-02 NOTE — Progress Notes (Addendum)
D:  Per pt self inventory pt reports sleeping poor, appetite improving, energy level low, ability to pay attention poor, rates depression at an 8 out of 10 and hopelessness at a 10 out of 10, pt has been on and off tearful today, states that she has nightmares about her children.  Denies HI/AVH, endorses SI without intent, contracts for safety.   A:  Emotional support provided, Encouraged pt to continue with treatment plan and attend all group activities, q15 min checks maintained for safety.  R:  Pt getting prn anxiety medication, pt did not attend am group.

## 2013-05-02 NOTE — [facility] Group Notes (Signed)
[facility] LCSW Group Therapy  05/02/2013  1:15 PM   Type of Therapy:  Group Therapy  Participation Level:  Active  Participation Quality:  Appropriate and Attentive  Affect:  Appropriate, Irritable   Cognitive:  Alert and Appropriate  Insight:  Developing/Improving and Engaged  Engagement in Therapy:  Developing/Improving and Engaged  Modes of Intervention:  Activity, Clarification, Confrontation, Discussion, Education, Exploration, Limit-setting, Orientation, Problem-solving, Rapport Building, Dance movement psychotherapist, Socialization and Support  Summary of Progress/Problems: Patient was attentive and engaged with speaker from [employer].  Patient was attentive to speaker while they shared their story of dealing with mental health and overcoming it.  Patient expressed interest in their programs and services and received information on their agency.  Patient processed ways they can relate to the speaker.   Pt was observed to be irritated at the beginning of group but sighing heavily and left for the remainder of group.    Uma Jerde Horton, LCSWA 05/02/2013 1:32 PM

## 2013-05-02 NOTE — Progress Notes (Signed)
Adult Psychoeducational Group Note  Date:  05/02/2013 Time:  11:00am Group Topic/Focus:  Building Self Esteem:   The Focus of this group is helping patients become aware of the effects of self-esteem on their lives, the things they and others do that enhance or undermine their self-esteem, seeing the relationship between their level of self-esteem and the choices they make and learning ways to enhance self-esteem.  Participation Level:  Active  Participation Quality:  Appropriate  Affect:  Appropriate  Cognitive:  Alert and Appropriate  Insight: Appropriate  Engagement in Group:  Engaged  Modes of Intervention:  Discussion and Education  Additional Comments:  Pt attended and participated in group. Discussion was about lifestyle changes. When ask what she would change about her life pt stated have an open relationship with her mom.  Shelly Bombard D 05/02/2013, 1:41 PM

## 2013-05-02 NOTE — Progress Notes (Signed)
D   Pt is resting in bed with eyes closed   No distress noted A   Will continue Q 15 min checks R   Pt safe at present

## 2013-05-03 MED ORDER — QUETIAPINE FUMARATE ER 50 MG PO TB24
450.0000 mg | ORAL_TABLET | Freq: Every day | ORAL | Status: DC
  Administered 2013-05-03 – 2013-05-05 (×3): 450 mg via ORAL
  Filled 2013-05-03: qty 1
  Filled 2013-05-03: qty 2
  Filled 2013-05-03 (×4): qty 1

## 2013-05-03 MED ORDER — QUETIAPINE FUMARATE 25 MG PO TABS
25.0000 mg | ORAL_TABLET | Freq: Two times a day (BID) | ORAL | Status: DC
  Administered 2013-05-03 – 2013-05-05 (×5): 25 mg via ORAL
  Filled 2013-05-03 (×11): qty 1

## 2013-05-03 MED ORDER — CLOTRIMAZOLE 2 % VA CREA
1.0000 | TOPICAL_CREAM | Freq: Every day | VAGINAL | Status: AC
  Administered 2013-05-03 – 2013-05-05 (×3): 1 via VAGINAL
  Filled 2013-05-03 (×2): qty 22.2

## 2013-05-03 NOTE — Progress Notes (Signed)
Psychoeducational Group Note  Date:  05/03/2013 Time:  2000  Group Topic/Focus:  Wrap-Up Group:   The focus of this group is to help patients review their daily goal of treatment and discuss progress on daily workbooks.  Participation Level: Did Not Attend  Participation Quality:  Not Applicable  Affect:  Not Applicable  Cognitive:  Not Applicable  Insight:  Not Applicable  Engagement in Group: Not Applicable  Additional Comments:  The patient did not attend group since she was asleep in her bed.   Joyce Leckey S 05/03/2013, 10:40 PM

## 2013-05-03 NOTE — Progress Notes (Signed)
Patient ID: [name], female   DOB: Jul 11, 1971, 42 y.o.   MRN: [id number] [facility] MD Progress Note  05/03/2013 5:26 PM [name]  MRN:  [id number] Subjective:  Patient is seen and medications discussed she notes that she is still somewhat distressed during the day and has some issues with impulse control. She notes that her depression is up and down and her anxiety is 7/10. She stated that the increase in the seroquel did help her.  Diagnosis:   DSM5: Schizophrenia Disorders:   Obsessive-Compulsive Disorders:   Trauma-Stressor Disorders:   Substance/Addictive Disorders:  Hx of substance abuse  Depressive Disorders:  Bipolar affective disorder ADL's:  Intact  Sleep: Fair  Appetite:  Fair  Suicidal Ideation:  Suicidal no plan no intent at this time can contract for safety. Homicidal Ideation:  Homicidal with plan but no intent and no means AEB (as evidenced by):  Psychiatric Specialty Exam: Review of Systems  Constitutional: Negative.  Negative for fever, chills, weight loss, malaise/fatigue and diaphoresis.  HENT: Negative for congestion and sore throat.   Eyes: Negative for blurred vision, double vision and photophobia.  Respiratory: Negative for cough, shortness of breath and wheezing.   Cardiovascular: Negative for chest pain, palpitations and PND.  Gastrointestinal: Negative for heartburn, nausea, vomiting, abdominal pain, diarrhea and constipation.  Musculoskeletal: Negative for falls, joint pain and myalgias.  Neurological: Negative for dizziness, tingling, tremors, sensory change, speech change, focal weakness, seizures, loss of consciousness, weakness and headaches.  Endo/Heme/Allergies: Negative for polydipsia. Does not bruise/bleed easily.  Psychiatric/Behavioral: Negative for depression, suicidal ideas, hallucinations, memory loss and substance abuse. The patient is not nervous/anxious and does not have insomnia.     Blood pressure 109/68, pulse 110, temperature  98 F (36.7 C), temperature source Oral, resp. rate 20, height 4\' 9"  (1.448 m), weight 77.111 kg (170 lb), SpO2 100.00%.Body mass index is 36.78 kg/(m^2).  General Appearance: Disheveled  Eye Solicitor::  Fair  Speech:  Clear and Coherent  Volume:  Increased  Mood:  Anxious and Depressed  Affect:  Congruent  Thought Process:  Circumstantial  Orientation:  Full (Time, Place, and Person)  Thought Content:  WDL  Suicidal Thoughts:  Yes.  without intent/plan  Homicidal Thoughts:  Yes.  without intent/plan  Memory:  NA  Judgement:  Impaired  Insight:  Lacking  Psychomotor Activity:  Normal  Concentration:  Fair  Recall:  Fair  Akathisia:  No  Handed:  Right  AIMS (if indicated):     Assets:  Desire for Improvement  Sleep:  Number of Hours: 5.25   Current Medications: Current Facility-Administered Medications  Medication Dose Route Frequency Provider Last Rate Last Dose  . acetaminophen (TYLENOL) tablet 650 mg  650 mg Oral Q6H PRN Earney Navy, NP      . alum & mag hydroxide-simeth (MAALOX/MYLANTA) 200-200-20 MG/5ML suspension 30 mL  30 mL Oral Q4H PRN Earney Navy, NP      . benztropine (COGENTIN) tablet 1 mg  1 mg Oral BID Nehemiah Settle, MD   1 mg at 05/03/13 7846  . cephALEXin (KEFLEX) capsule 500 mg  500 mg Oral Q12H Nehemiah Settle, MD   500 mg at 05/03/13 0823  . clotrimazole (GYNE-LOTRIMIN 3) 2 % vaginal cream 1 Applicatorful  1 Applicatorful Vaginal QHS Verne Spurr, PA-C      . divalproex (DEPAKOTE ER) 24 hr tablet 1,000 mg  1,000 mg Oral QHS Verne Spurr, PA-C   1,000 mg at 05/02/13 2141  .  divalproex (DEPAKOTE ER) 24 hr tablet 500 mg  500 mg Oral BH-q7a Verne Spurr, PA-C   500 mg at 05/03/13 6213  . FLUoxetine (PROZAC) capsule 20 mg  20 mg Oral Daily Nehemiah Settle, MD   20 mg at 05/03/13 0865  . guaiFENesin (ROBITUSSIN) 100 MG/5ML solution 100 mg  5 mL Oral Q4H PRN Verne Spurr, PA-C   100 mg at 04/29/13 1543  . ibuprofen  (ADVIL,MOTRIN) tablet 600 mg  600 mg Oral Q8H PRN Earney Navy, NP      . LORazepam (ATIVAN) tablet 1 mg  1 mg Oral Q8H PRN Earney Navy, NP   1 mg at 05/02/13 1036  . magnesium hydroxide (MILK OF MAGNESIA) suspension 30 mL  30 mL Oral Daily PRN Earney Navy, NP      . menthol-cetylpyridinium (CEPACOL) lozenge 3 mg  1 lozenge Oral PRN Verne Spurr, PA-C   3 mg at 04/30/13 0311  . nicotine (NICODERM CQ - dosed in mg/24 hours) patch 14 mg  14 mg Transdermal Daily Nehemiah Settle, MD   14 mg at 05/03/13 0824  . ondansetron (ZOFRAN) tablet 4 mg  4 mg Oral Q8H PRN Earney Navy, NP      . QUEtiapine (SEROQUEL XR) 24 hr tablet 450 mg  450 mg Oral QHS Verne Spurr, PA-C      . QUEtiapine (SEROQUEL) tablet 25 mg  25 mg Oral BID Verne Spurr, PA-C      . zolpidem (AMBIEN) tablet 5 mg  5 mg Oral QHS PRN Earney Navy, NP   5 mg at 05/02/13 0020    Lab Results: No results found for this or any previous visit (from the past 48 hour(s)).  Physical Findings: AIMS: Facial and Oral Movements Muscles of Facial Expression: None, normal Lips and Perioral Area: None, normal Jaw: None, normal Tongue: None, normal,Extremity Movements Upper (arms, wrists, hands, fingers): None, normal Lower (legs, knees, ankles, toes): None, normal, Trunk Movements Neck, shoulders, hips: None, normal, Overall Severity Severity of abnormal movements (highest score from questions above): None, normal Incapacitation due to abnormal movements: None, normal Patient's awareness of abnormal movements (rate only patient's report): No Awareness, Dental Status Current problems with teeth and/or dentures?: No Does patient usually wear dentures?: No  CIWA:  CIWA-Ar Total: 1 COWS:     Treatment Plan Summary: Daily contact with patient to assess and evaluate symptoms and progress in treatment Medication management  Continue Prozac 20 mg Po QD/ depression. Increase Depakote to 1000mg  at HS. And  continue 500mg  AM. Increase Seroquel to 450mg  at HS.  Order depakote level for Saturday. Will consider risperdal if no changes in mood with increase in medications.  Disposition in progress. Plan: 1. Continue crisis management and stabilization. 2. Medication management to reduce current symptoms to base line and improve patient's overall level of functioning 3. Treat health problems as indicated. 4. Develop treatment plan to decrease risk of relapse upon discharge and the need for readmission. 5. Psycho-social education regarding relapse prevention and self care. 6. Health care follow up as needed for medical problems. 7. Continue home medications where appropriate. 8. ELOS: 3-5 days 9. Increased the seroquel to 450mg  at hs. And will also give 25mg  po BID during the day for agitation.  Medical Decision Making Problem Points:  Established problem, stable/improving (1) Data Points:  Review or order medicine tests (1)  I certify that inpatient services furnished can reasonably be expected to improve the patient's condition.  Rona Ravens. Mashburn RPAC 5:26 PM 05/03/2013  Reviewed the information documented and agree with the treatment plan.  Davy Faught,JANARDHAHA R. 05/03/2013 11:40 PM

## 2013-05-03 NOTE — Tx Team (Signed)
Interdisciplinary Treatment Plan Update (Adult)  Date: 05/03/2013  Time Reviewed:  9:45 AM  Progress in Treatment: Attending groups: Yes Participating in groups:  Yes Taking medication as prescribed:  Yes Tolerating medication:  Yes Family/Significant othe contact made: Yes Patient understands diagnosis:  Yes Discussing patient identified problems/goals with staff:  Yes Medical problems stabilized or resolved:  Yes Denies suicidal/homicidal ideation: Yes Issues/concerns per patient self-inventory:  Yes Other:  New problem(s) identified: Pt's meds increased for labile mood and agitation  Discharge Plan or Barriers: Pt will follow up at [facility] [place] for medication management and therapy.   Reason for Continuation of Hospitalization: Anxiety Depression Medication Stabilization  Comments: N/A  Estimated length of stay: 3-4 days  For review of initial/current patient goals, please see plan of care.  Attendees: Patient:     Family:     Physician:  Dr. Javier Glazier 05/03/2013 10:28 AM   Nursing:   Lowella Grip, RN 05/03/2013 10:28 AM   Clinical Social Worker:  Reyes Ivan, LCSWA 05/03/2013 10:28 AM   Other: Verne Spurr, PA 05/03/2013 10:28 AM   Other:  Frankey Shown, MA care coordination 05/03/2013 10:28 AM   Other:  Juline Patch, LCSW 05/03/2013 10:28 AM   Other:  Leanor Kail, RN 05/03/2013 10:29 AM   Other: Onnie Boer, RN 05/03/2013 10:29 AM   Other: Kalman Shan, PA intern 05/03/2013 10:29 AM   Other:    Other:    Other:    Other:     Scribe for Treatment Team:   Carmina Miller, 05/03/2013 10:28 AM

## 2013-05-03 NOTE — Progress Notes (Signed)
[name] is seen out in the  milieu, tolerating well. She is labile, she gets agitated very easily, but allows herself to calm down as this nurse sits and speaks with her.    A Her affect is flat and depressed. SHe demonstrates minimal engagement into her recovery. She completed her AM self inventory and on it she wrote she denied SI within the past 24 hrs, she rated her depression and hopelessness "8-10" and stated her DC plan as to " do everything right and not take chances".    R Safety is in place and will cont to encorage pt to engage in poc and recovery.

## 2013-05-03 NOTE — [facility] Group Notes (Signed)
[facility] LCSW Aftercare Discharge Planning Group Note   05/03/2013 8:45 AM  Participation Quality:  Did Not Attend  Reyes Ivan, LCSWA 05/03/2013 10:10 AM

## 2013-05-03 NOTE — [facility] Group Notes (Signed)
[facility] LCSW Group Therapy  05/03/2013  1:15 PM    Type of Therapy:  Group Therapy  Participation Level:  Did Not Attend  [name], LCSWA 05/03/2013 2:33 PM

## 2013-05-04 NOTE — Progress Notes (Signed)
Psychoeducational Group Note  Date:  05/04/2013 Time: 1015  Group Topic/Focus:  Identifying Needs:   The focus of this group is to help patients identify their personal needs that have been historically problematic and identify healthy behaviors to address their needs.  Participation Level:  Active  Participation Quality:  Appropriate  Affect:  Appropriate  Cognitive:  Alert  Insight:  Limited  Engagement in Group:  Engaged  Additional Comments:    05/04/2013,4:53 PM Iolani Twilley, Joie Bimler

## 2013-05-04 NOTE — Progress Notes (Signed)
[facility] Group Notes:  (Nursing/MHT/Case Management/Adjunct)  Date:  05/04/2013  Time:  2000  Type of Therapy:  Psychoeducational Skills  Participation Level:  None  Participation Quality:  Resistant  Affect:  Flat  Cognitive:  Lacking  Insight:  None  Engagement in Group:  None  Modes of Intervention:  Education  Summary of Progress/Problems: The patient attended group, but she refused to share.   Hazle Coca S 05/04/2013, 11:36 PM

## 2013-05-04 NOTE — Progress Notes (Signed)
Patient ID: [name], female   DOB: [birth year], 42 y.o.   MRN: [id number] [facility] MD Progress Note  05/04/2013 6:39 PM [name]  MRN:  [id number]  Subjective:  Patient is seen. She is more sleepy and feel lightheaded today.  Diagnosis:   DSM5: Schizophrenia Disorders:   Obsessive-Compulsive Disorders:   Trauma-Stressor Disorders:   Substance/Addictive Disorders:  Hx of substance abuse  Depressive Disorders:  Bipolar affective disorder ADL's:  Intact  Sleep: Fair  Appetite:  Fair  Suicidal Ideation:  Suicidal no plan no intent at this time can contract for safety. Homicidal Ideation:  Homicidal with plan but no intent and no means AEB (as evidenced by):  Psychiatric Specialty Exam: Review of Systems  Constitutional: Negative.  Negative for fever, chills, weight loss, malaise/fatigue and diaphoresis.  HENT: Negative for congestion and sore throat.   Eyes: Negative for blurred vision, double vision and photophobia.  Respiratory: Negative for cough, shortness of breath and wheezing.   Cardiovascular: Negative for chest pain, palpitations and PND.  Gastrointestinal: Negative for heartburn, nausea, vomiting, abdominal pain, diarrhea and constipation.  Musculoskeletal: Negative for falls, joint pain and myalgias.  Neurological: Negative for dizziness, tingling, tremors, sensory change, speech change, focal weakness, seizures, loss of consciousness, weakness and headaches.  Endo/Heme/Allergies: Negative for polydipsia. Does not bruise/bleed easily.  Psychiatric/Behavioral: Negative for depression, suicidal ideas, hallucinations, memory loss and substance abuse. The patient is not nervous/anxious and does not have insomnia.     Blood pressure 102/70, pulse 99, temperature 97.8 F (36.6 C), temperature source Oral, resp. rate 18, height 4\' 9"  (1.448 m), weight 77.111 kg (170 lb), SpO2 100.00%.Body mass index is 36.78 kg/(m^2).  General Appearance: Disheveled  Eye Solicitor::  Fair   Speech:  Clear and Coherent  Volume:  Increased  Mood:  Anxious and Depressed  Affect:  Congruent  Thought Process:  Circumstantial  Orientation:  Full (Time, Place, and Person)  Thought Content:  WDL  Suicidal Thoughts:  Yes.  without intent/plan  Homicidal Thoughts:  denies  Memory:  NA  Judgement:  Impaired  Insight:  Lacking  Psychomotor Activity:  Normal  Concentration:  Fair  Recall:  Fair  Akathisia:  No  Handed:  Right  AIMS (if indicated):     Assets:  Desire for Improvement  Sleep:  Number of Hours: 5.5   Current Medications: Current Facility-Administered Medications  Medication Dose Route Frequency Provider Last Rate Last Dose  . acetaminophen (TYLENOL) tablet 650 mg  650 mg Oral Q6H PRN Earney Navy, NP      . alum & mag hydroxide-simeth (MAALOX/MYLANTA) 200-200-20 MG/5ML suspension 30 mL  30 mL Oral Q4H PRN Earney Navy, NP      . benztropine (COGENTIN) tablet 1 mg  1 mg Oral BID Nehemiah Settle, MD   1 mg at 05/04/13 1818  . cephALEXin (KEFLEX) capsule 500 mg  500 mg Oral Q12H Nehemiah Settle, MD   500 mg at 05/04/13 0827  . clotrimazole (GYNE-LOTRIMIN 3) 2 % vaginal cream 1 Applicatorful  1 Applicatorful Vaginal QHS Verne Spurr, PA-C   1 Applicatorful at 05/03/13 2243  . divalproex (DEPAKOTE ER) 24 hr tablet 1,000 mg  1,000 mg Oral QHS Verne Spurr, PA-C   1,000 mg at 05/03/13 2153  . divalproex (DEPAKOTE ER) 24 hr tablet 500 mg  500 mg Oral BH-q7a Verne Spurr, PA-C   500 mg at 05/04/13 0827  . FLUoxetine (PROZAC) capsule 20 mg  20 mg Oral Daily Tomasita Crumble  Filbert Schilder, MD   20 mg at 05/04/13 0827  . guaiFENesin (ROBITUSSIN) 100 MG/5ML solution 100 mg  5 mL Oral Q4H PRN Verne Spurr, PA-C   100 mg at 04/29/13 1543  . ibuprofen (ADVIL,MOTRIN) tablet 600 mg  600 mg Oral Q8H PRN Earney Navy, NP      . LORazepam (ATIVAN) tablet 1 mg  1 mg Oral Q8H PRN Earney Navy, NP   1 mg at 05/02/13 1036  . magnesium hydroxide  (MILK OF MAGNESIA) suspension 30 mL  30 mL Oral Daily PRN Earney Navy, NP      . menthol-cetylpyridinium (CEPACOL) lozenge 3 mg  1 lozenge Oral PRN Verne Spurr, PA-C   3 mg at 04/30/13 0311  . nicotine (NICODERM CQ - dosed in mg/24 hours) patch 14 mg  14 mg Transdermal Daily Nehemiah Settle, MD   14 mg at 05/04/13 0827  . ondansetron (ZOFRAN) tablet 4 mg  4 mg Oral Q8H PRN Earney Navy, NP      . QUEtiapine (SEROQUEL XR) 24 hr tablet 450 mg  450 mg Oral QHS Verne Spurr, PA-C   450 mg at 05/03/13 2153  . QUEtiapine (SEROQUEL) tablet 25 mg  25 mg Oral BID Verne Spurr, PA-C   25 mg at 05/04/13 1818  . zolpidem (AMBIEN) tablet 5 mg  5 mg Oral QHS PRN Earney Navy, NP   5 mg at 05/02/13 0020    Lab Results: No results found for this or any previous visit (from the past 48 hour(s)).  Physical Findings: AIMS: Facial and Oral Movements Muscles of Facial Expression: None, normal Lips and Perioral Area: None, normal Jaw: None, normal Tongue: None, normal,Extremity Movements Upper (arms, wrists, hands, fingers): None, normal Lower (legs, knees, ankles, toes): None, normal, Trunk Movements Neck, shoulders, hips: None, normal, Overall Severity Severity of abnormal movements (highest score from questions above): None, normal Incapacitation due to abnormal movements: None, normal Patient's awareness of abnormal movements (rate only patient's report): No Awareness, Dental Status Current problems with teeth and/or dentures?: No Does patient usually wear dentures?: No  CIWA:  CIWA-Ar Total: 1 COWS:     Treatment Plan Summary: Daily contact with patient to assess and evaluate symptoms and progress in treatment Medication management  Continue current meds and will continue to observe  Disposition in progress. Plan: 1. Continue crisis management and stabilization. 2. Medication management to reduce current symptoms to base line and improve patient's overall level of  functioning 3. Treat health problems as indicated. 4. Develop treatment plan to decrease risk of relapse upon discharge and the need for readmission. 5. Psycho-social education regarding relapse prevention and self care. 6. Health care follow up as needed for medical problems. 7. Continue home medications where appropriate. 8. ELOS: 3-5 days .  Medical Decision Making Problem Points:  Established problem, stable/improving (1) Data Points:  Review or order medicine tests (1)  I certify that inpatient services furnished can reasonably be expected to improve the patient's condition.     Wonda Cerise 05/04/2013 6:39 PM

## 2013-05-04 NOTE — Progress Notes (Signed)
Patient ID: [name], female   DOB: September 10, 1970, 42 y.o.   MRN: [id number] D)  Had been up and about on the hall at the beginning of the shift, but went to bed.  Had eyes closed when keflex was taken to her and said she had been sleeping,  When reminded group had just started she said she wasn't going, turned over and tried to go back to sleep.  Came out afterward for snack, was irritable, wanting to know why the lotrimin creme wasn't here yet, was explained that it wasn't in stock and had to be sent from [facility].  Seemed a little upset, said she was itchy, and went to her room and took a shower.  When med arrived, was instructed on how to fill tube,voiced understanding, applied per self, went back to bed. A)  Will continue to monitor for safety, continue POC R)  Safety maintained.

## 2013-05-04 NOTE — [facility] Group Notes (Signed)
[facility] Group Notes:  (Clinical Social Work)  05/04/2013   3:00-4:00PM  Summary of Progress/Problems:   The main focus of today's process group was for the patient to identify ways in which they have sabotaged their own mental health wellness/recovery.  Motivational interviewing was used to explore the reasons they engage in this behavior, and reasons they may have for wanting to change.  The Stages of Change were explained to the group using a handout, and patients identified where they are with regard to changing self-defeating behaviors.  The patient expressed that she self sabotages by drinking heavily, and indicated some level of suicidality today.  She was resistant to all of the group suggestions.  She stated she is in Pre-Contemplation stage of change, that she just can't wait to get out of the hospital where she will do what she "wants to do anyway."  Type of Therapy:  Process Group  Participation Level:  Active  Participation Quality:  Resistant  Affect:  Flat and Irritable  Cognitive:  Appropriate  Insight:  Limited  Engagement in Therapy:  Limited  Modes of Intervention:  Education, Motivational Interviewing   Ambrose Mantle, LCSW 05/04/2013, 4:30 PM

## 2013-05-05 NOTE — Progress Notes (Signed)
Patient ID: [name], female   DOB: Jan 19, 1971, 42 y.o.   MRN: [id number] D)  Came to the med window at the beginning of the shift wanting to take her meds and go to bed.  Spent some time with her discussing her UTI and ways of prevention, was receptive and appreciative.  Took her Keflex and went to group instead of going to bed.  Was irritated when group was finished and she had to wait to get her meds because a pt was ahead of her.  A)  Will continue to monitor for safety, continue POC R)  Safety maintained.

## 2013-05-05 NOTE — Progress Notes (Signed)
Date: 05/05/2013  Time: 1015  Group Topic/Focus:  Making Healthy Choices: The focus of this group is to help patients identify negative/unhealthy choices they were using prior to admission and identify positive/healthier coping strategies to replace them upon discharge.  Participation Level: Active  Participation Quality: Appropriate  Affect: Appropriate  Cognitive: Oriented  Insight: Improving  Engagement in Group: Improving  Additional Comments: was engaged in the discussion and partisipated  [name] A  05/05/2013  

## 2013-05-05 NOTE — Progress Notes (Signed)
Psychoeducational Group Note  Date: 05/05/2013 Time: 0930 Group Topic/Focus:  Gratefulness:  The focus of this group is to help patients identify what three things they are most grateful for in their lives. What helps ground them and to center them on their work to their recovery.  Participation Level:  Active  Participation Quality:  Appropriate  Affect:  Appropriate  Cognitive:  Alert  Insight:  Improving  Engagement in Group:  Engaged  Additional Comments:  Pt stated she was grateful for her God, her sister, her niece and nephew  Dione Housekeeper

## 2013-05-05 NOTE — Progress Notes (Signed)
[facility] Group Notes:  (Nursing/MHT/Case Management/Adjunct)  Date:  05/05/2013  Time:  2000  Type of Therapy:  Psychoeducational Skills  Participation Level:  None  Participation Quality:  Resistant  Affect:  Flat  Cognitive:  Lacking  Insight:  None  Engagement in Group:  None  Modes of Intervention:  Education  Summary of Progress/Problems: The patient attended but did not share any of her feelings.   Tilda Samudio S 05/05/2013, 11:03 PM

## 2013-05-05 NOTE — Progress Notes (Signed)
Patient ID: [name], female   DOB: 04-01-1971, 42 y.o.   MRN: [id number] [facility] MD Progress Note  05/05/2013 5:41 PM ALLYSEN [name]  MRN:  [id number]  Subjective:  Patient is seen. She is sleepy and not dizziness today.  Diagnosis:   DSM5: Schizophrenia Disorders:   Obsessive-Compulsive Disorders:   Trauma-Stressor Disorders:   Substance/Addictive Disorders:  Hx of substance abuse  Depressive Disorders:  Bipolar affective disorder ADL's:  Intact  Sleep: Fair  Appetite:  Fair  Suicidal Ideation:  Suicidal no plan no intent at this time can contract for safety. Homicidal Ideation:  Homicidal with plan but no intent and no means AEB (as evidenced by):  Psychiatric Specialty Exam: Review of Systems  Constitutional: Negative.  Negative for fever, chills, weight loss, malaise/fatigue and diaphoresis.  HENT: Negative for congestion and sore throat.   Eyes: Negative for blurred vision, double vision and photophobia.  Respiratory: Negative for cough, shortness of breath and wheezing.   Cardiovascular: Negative for chest pain, palpitations and PND.  Gastrointestinal: Negative for heartburn, nausea, vomiting, abdominal pain, diarrhea and constipation.  Musculoskeletal: Negative for falls, joint pain and myalgias.  Neurological: Negative for dizziness, tingling, tremors, sensory change, speech change, focal weakness, seizures, loss of consciousness, weakness and headaches.  Endo/Heme/Allergies: Negative for polydipsia. Does not bruise/bleed easily.  Psychiatric/Behavioral: Negative for depression, suicidal ideas, hallucinations, memory loss and substance abuse. The patient is not nervous/anxious and does not have insomnia.     Blood pressure 97/70, pulse 97, temperature 97.8 F (36.6 C), temperature source Oral, resp. rate 18, height 4\' 9"  (1.448 m), weight 77.111 kg (170 lb), SpO2 100.00%.Body mass index is 36.78 kg/(m^2).  General Appearance: Disheveled  Eye Solicitor::  Fair   Speech:  Clear and Coherent  Volume:  Increased  Mood:  Anxious and Depressed  Affect:  Congruent  Thought Process:  Circumstantial  Orientation:  Full (Time, Place, and Person)  Thought Content:  WDL  Suicidal Thoughts:  denies  Homicidal Thoughts:  denies  Memory:  NA  Judgement:  Impaired  Insight:  Lacking  Psychomotor Activity:  Normal  Concentration:  Fair  Recall:  Fair  Akathisia:  No  Handed:  Right  AIMS (if indicated):     Assets:  Desire for Improvement  Sleep:  Number of Hours: 5.5   Current Medications: Current Facility-Administered Medications  Medication Dose Route Frequency Provider Last Rate Last Dose  . acetaminophen (TYLENOL) tablet 650 mg  650 mg Oral Q6H PRN Earney Navy, NP      . alum & mag hydroxide-simeth (MAALOX/MYLANTA) 200-200-20 MG/5ML suspension 30 mL  30 mL Oral Q4H PRN Earney Navy, NP      . benztropine (COGENTIN) tablet 1 mg  1 mg Oral BID Nehemiah Settle, MD   1 mg at 05/05/13 1658  . clotrimazole (GYNE-LOTRIMIN 3) 2 % vaginal cream 1 Applicatorful  1 Applicatorful Vaginal QHS Verne Spurr, PA-C   1 Applicatorful at 05/04/13 2101  . divalproex (DEPAKOTE ER) 24 hr tablet 1,000 mg  1,000 mg Oral QHS Verne Spurr, PA-C   1,000 mg at 05/04/13 2059  . divalproex (DEPAKOTE ER) 24 hr tablet 500 mg  500 mg Oral BH-q7a Neil Mashburn, PA-C   500 mg at 05/05/13 6433  . FLUoxetine (PROZAC) capsule 20 mg  20 mg Oral Daily Nehemiah Settle, MD   20 mg at 05/05/13 1025  . guaiFENesin (ROBITUSSIN) 100 MG/5ML solution 100 mg  5 mL Oral Q4H PRN Verne Spurr,  PA-C   100 mg at 04/29/13 1543  . ibuprofen (ADVIL,MOTRIN) tablet 600 mg  600 mg Oral Q8H PRN Earney Navy, NP      . LORazepam (ATIVAN) tablet 1 mg  1 mg Oral Q8H PRN Earney Navy, NP   1 mg at 05/02/13 1036  . magnesium hydroxide (MILK OF MAGNESIA) suspension 30 mL  30 mL Oral Daily PRN Earney Navy, NP      . menthol-cetylpyridinium (CEPACOL) lozenge 3  mg  1 lozenge Oral PRN Verne Spurr, PA-C   3 mg at 04/30/13 0311  . nicotine (NICODERM CQ - dosed in mg/24 hours) patch 14 mg  14 mg Transdermal Daily Nehemiah Settle, MD   14 mg at 05/05/13 0639  . ondansetron (ZOFRAN) tablet 4 mg  4 mg Oral Q8H PRN Earney Navy, NP      . QUEtiapine (SEROQUEL XR) 24 hr tablet 450 mg  450 mg Oral QHS Verne Spurr, PA-C   450 mg at 05/04/13 2100  . QUEtiapine (SEROQUEL) tablet 25 mg  25 mg Oral BID Verne Spurr, PA-C   25 mg at 05/05/13 1658  . zolpidem (AMBIEN) tablet 5 mg  5 mg Oral QHS PRN Earney Navy, NP   5 mg at 05/05/13 4782    Lab Results: No results found for this or any previous visit (from the past 48 hour(s)).  Physical Findings: AIMS: Facial and Oral Movements Muscles of Facial Expression: None, normal Lips and Perioral Area: None, normal Jaw: None, normal Tongue: None, normal,Extremity Movements Upper (arms, wrists, hands, fingers): None, normal Lower (legs, knees, ankles, toes): None, normal, Trunk Movements Neck, shoulders, hips: None, normal, Overall Severity Severity of abnormal movements (highest score from questions above): None, normal Incapacitation due to abnormal movements: None, normal Patient's awareness of abnormal movements (rate only patient's report): No Awareness, Dental Status Current problems with teeth and/or dentures?: No Does patient usually wear dentures?: No  CIWA:  CIWA-Ar Total: 1 COWS:     Treatment Plan Summary: Daily contact with patient to assess and evaluate symptoms and progress in treatment Medication management  Continue current meds and will continue to observe  Disposition in progress. Plan: 1. Continue crisis management and stabilization. 2. Medication management to reduce current symptoms to base line and improve patient's overall level of functioning 3. Treat health problems as indicated. 4. Develop treatment plan to decrease risk of relapse upon discharge and the  need for readmission. 5. Psycho-social education regarding relapse prevention and self care. 6. Health care follow up as needed for medical problems. 7. Continue home medications where appropriate. 8. ELOS: 3-4  days .  Medical Decision Making Problem Points:  Established problem, stable/improving (1) Data Points:  Review or order medicine tests (1)  I certify that inpatient services furnished can reasonably be expected to improve the patient's condition.     Wonda Cerise 05/05/2013 5:41 PM

## 2013-05-05 NOTE — Progress Notes (Signed)
D [name] has spent more time out of her room today. Visiting with others in the day room, talking to the nurses and walking up and down the halls. She cont to have a sad, depressed, flat affect.   A She completes her AM self inventory and on it she wrote she cont to struggle with " off and on " feeligns of SI, she rated her depression and hopelessness "7/5" and she stated her DC plan is to " find me a place to live".   R Safety is in place and poc moves forward.

## 2013-05-05 NOTE — [facility] Group Notes (Signed)
[facility] Group Notes:  (Clinical Social Work)  05/05/2013   3:00-4:00PM  Summary of Progress/Problems:   The main focus of today's process group was to   identify the patient's current support system and decide on other supports that can be put in place.  The picture on workbook was used to discuss why additional supports are needed, and a hand-out was distributed with four definitions/levels of support, then used to talk about how patients have given and received all different kinds of support.  An emphasis was placed on using counselor, doctor, therapy groups, 12-step groups, and problem-specific support groups to expand supports.  The patient expressed full comprehension of the concepts presented, and agreed that there is a need to add more supports.  Type of Therapy:  Process Group  Participation Level:  Minimal  Participation Quality:  Attentive  Affect:  Depressed and Flat  Cognitive:  Alert  Insight:  Limited  Engagement in Therapy:  Engaged  Modes of Intervention:  Education,  Support and [employer], LCSW 05/05/2013, 4:26 PM

## 2013-05-05 NOTE — Progress Notes (Signed)
Patient ID: [name], female   DOB: 1971-03-25, 42 y.o.   MRN: [id number] D)  Has been brighter this evening, out and about on the hall and dayroom, states is feeling better, and states her urine is clear again, denied burning, antibiotic completed.  Has been interacting more with peers, didn't engage when some inappropriate comments were being made, stated she really wants to get her life together.  Stated she used to go to church, wants to go back, wants to develop support and start doing the right thing. Has been compliant with program, meds. A)  Will continue to monitor for safety, support and encouragement, continue POC R)  Safety maintained.

## 2013-05-06 DIAGNOSIS — F3110 Bipolar disorder, current episode manic without psychotic features, unspecified: Secondary | ICD-10-CM | POA: Diagnosis present

## 2013-05-06 DIAGNOSIS — F1910 Other psychoactive substance abuse, uncomplicated: Secondary | ICD-10-CM | POA: Diagnosis present

## 2013-05-06 MED ORDER — QUETIAPINE FUMARATE 25 MG PO TABS: 25.0000 mg | ORAL_TABLET | Freq: Two times a day (BID) | ORAL | Status: DC

## 2013-05-06 MED ORDER — QUETIAPINE FUMARATE ER 300 MG PO TB24: 300.0000 mg | ORAL_TABLET | Freq: Every day | ORAL | Status: DC

## 2013-05-06 MED ORDER — QUETIAPINE FUMARATE ER 300 MG PO TB24
300.0000 mg | ORAL_TABLET | Freq: Every day | ORAL | Status: DC
  Filled 2013-05-06: qty 14

## 2013-05-06 MED ORDER — FLUOXETINE HCL 20 MG PO CAPS: 20.0000 mg | ORAL_CAPSULE | Freq: Every day | ORAL | Status: DC

## 2013-05-06 MED ORDER — BENZTROPINE MESYLATE 1 MG PO TABS: 1.0000 mg | ORAL_TABLET | Freq: Two times a day (BID) | ORAL | Status: DC

## 2013-05-06 MED ORDER — ZOLPIDEM TARTRATE 5 MG PO TABS: 5.0000 mg | ORAL_TABLET | Freq: Every evening | ORAL | Status: DC | PRN

## 2013-05-06 MED ORDER — DIVALPROEX SODIUM ER 500 MG PO TB24: ORAL_TABLET | ORAL | Status: DC

## 2013-05-06 NOTE — Progress Notes (Signed)
Pt attended spiritual care group on grief and loss facilitated by chaplain Burnis Kingfisher.  Group opened with brief discussion and psycho-social ed around grief and loss in relationships and in relation to self - identifying life patterns, circumstances, changes that cause losses. Established group norm of speaking from own life experience.  Group goal of establishing open and affirming space for members to share loss and experience with grief, normalize grief experience and provide psycho social education and grief support. Group members discussed guilt related to decisions made in life - expressing grief around the ways they have hurt others, changes in relationships, and difficulty in forgiving self and accepting forgiveness from others.  Also discussed loss of family members without saying goodbye, focusing on theme of regret around things left unsaid.  Group discussed ways different family members   Amra was attentive in group, but did not contribute to discussion.  Requested to speak with chaplain afterward.   Belva Crome MDiv

## 2013-05-06 NOTE — [facility] Group Notes (Signed)
[facility] LCSW Aftercare Discharge Planning Group Note   05/06/2013 8:45 AM  Participation Quality:  Did Not Attend  [name], LCSWA 05/06/2013 9:56 AM

## 2013-05-06 NOTE — [facility] Suicide Risk Assessment (Signed)
Suicide Risk Assessment  Discharge Assessment     Demographic Factors:  Adolescent or young adult, Low socioeconomic status and Unemployed  Mental Status Per Nursing Assessment::   On Admission:     Current Mental Status by Physician: Mental Status Examination: Patient appeared as per his stated age, casually dressed, and fairly groomed, and maintaining good eye contact. Patient has good mood and his affect was constricted. He has normal rate, rhythm, and volume of speech. His thought process is linear and goal directed. Patient has denied suicidal, homicidal ideations, intentions or plans. Patient has no evidence of auditory or visual hallucinations, delusions, and paranoia. Patient has fair insight judgment and impulse control.  Loss Factors: Decrease in vocational status and Financial problems/change in socioeconomic status  Historical Factors: Impulsivity  Risk Reduction Factors:   Sense of responsibility to family, Religious beliefs about death, Living with another person, especially a relative, Positive social support, Positive therapeutic relationship and Positive coping skills or problem solving skills  Continued Clinical Symptoms:  Bipolar Disorder:   Mixed State Alcohol/Substance Abuse/Dependencies Unstable or Poor Therapeutic Relationship Previous Psychiatric Diagnoses and Treatments Medical Diagnoses and Treatments/Surgeries  Cognitive Features That Contribute To Risk:  Polarized thinking    Suicide Risk:  Minimal: No identifiable suicidal ideation.  Patients presenting with no risk factors but with morbid ruminations; may be classified as minimal risk based on the severity of the depressive symptoms  Discharge Diagnoses:   AXIS I:  Bipolar, Manic AXIS II:  Cluster B Traits AXIS III:   Past Medical History  Diagnosis Date  . Hepatitis   . Hypertension   . Anxiety   . Depression    AXIS IV:  economic problems, occupational problems, other psychosocial or  environmental problems, problems related to social environment and problems with primary support group AXIS V:  51-60 moderate symptoms  Plan Of Care/Follow-up recommendations:  Activity:  As tolerated Diet:  Regular  Is patient on multiple antipsychotic therapies at discharge:  No   Has Patient had three or more failed trials of antipsychotic monotherapy by history:  No  Recommended Plan for Multiple Antipsychotic Therapies: NA  Nehemiah Settle., M.D. 05/06/2013, 1:24 PM

## 2013-05-06 NOTE — Progress Notes (Signed)
Adult Psychoeducational Group Note  Date:  05/06/2013 Time:  11:00am Group Topic/Focus:  Self Care:   The focus of this group is to help patients understand the importance of self-care in order to improve or restore emotional, physical, spiritual, interpersonal, and financial health.  Participation Level:  Did Not Attend  Participation Quality:    Affect:    Cognitive:    Insight:   Engagement in Group:    Modes of Intervention:    Additional Comments:  Pt did not attend group.  Shelly Bombard D 05/06/2013, 2:03 PM

## 2013-05-06 NOTE — Tx Team (Signed)
Interdisciplinary Treatment Plan Update (Adult)  Date: 05/06/2013  Time Reviewed:  9:45 AM  Progress in Treatment: Attending groups: Yes Participating in groups:  Yes Taking medication as prescribed:  Yes Tolerating medication:  Yes Family/Significant othe contact made: Yes Patient understands diagnosis:  Yes Discussing patient identified problems/goals with staff:  Yes Medical problems stabilized or resolved:  Yes Denies suicidal/homicidal ideation: Yes Issues/concerns per patient self-inventory:  Yes Other:  New problem(s) identified: N/A  Discharge Plan or Barriers: Pt will follow up at [facility] [place] for medication management and therapy.  Reason for Continuation of Hospitalization: Stable to d/c today  Comments: N/A  Estimated length of stay: D/C today  For review of initial/current patient goals, please see plan of care.  Attendees: Patient:  [name] 05/06/2013 12:03 PM   Family:     Physician:  Dr. Javier Glazier 05/06/2013 12:03 PM   Nursing:   Neill Loft, RN 05/06/2013 12:03 PM   Clinical Social Worker:  Reyes Ivan, LCSWA 05/06/2013 12:03 PM   Other: Verne Spurr, PA 05/06/2013 12:03 PM   Other:  Frankey Shown, MA care coordination 05/06/2013 12:03 PM   Other:  Quintella Reichert, RN 05/06/2013 12:03 PM   Other:     Other:    Other:    Other:    Other:    Other:      Scribe for Treatment Team:   Carmina Miller, 05/06/2013 , 12:03 PM

## 2013-05-06 NOTE — Progress Notes (Signed)
Reygan requested to speak with chaplain following grief and loss group.   Chaplain provided grief support around pt's loss of grandmother, disappointment and anger with mother and father for not being able to be supportive, pt's separation from children.  Pt spoke with chaplain about grandmother as source of safety and love and her feeling of isolation since grandmother's death.   Worked with chaplain to recognize grief around parent's absence and discern where she may be able to find the support she needs.

## 2013-05-06 NOTE — Progress Notes (Signed)
[facility] Adult Case Management Discharge Plan :  Will you be returning to the same living situation after discharge: Yes,  pt was homeless prior to admission and verbalizes a plan to stay with friends At discharge, do you have transportation home?:Yes,  provided pt with a bus pass Do you have the ability to pay for your medications:Yes,  access to meds  Release of information consent forms completed and in the chart;  Patient's signature needed at discharge.  Patient to Follow up at: Follow-up Information   Follow up with [facility] [place] On 05/07/2013. (Walk in for hospital discharge appointment, for medication management and therapy.  Walk in clinic is Monday - Friday 8 am - 3 pm.  )    Contact information:   [address], [place] [address] Phone: [phone] Fax: [phone]      Patient denies SI/HI:   Yes,  denies SI/HI    Safety Planning and Suicide Prevention discussed:  Yes,  discussed with pt and pt's mother.  See suicide prevention education note.   [name] 05/06/2013, 12:07 PM

## 2013-05-06 NOTE — Discharge Summary (Signed)
Physician Discharge Summary Note  Patient:  [name] is an 42 y.o., female MRN:  [id number] DOB:  04-11-1971 Patient phone:  [phone] (home)  Patient address:   [address] [place] [address],   Date of Admission:  04/27/2013 Date of Discharge: 05/06/2013   Reason for Admission:  Suicide attempt  Discharge Diagnoses: Active Problems:   * No active hospital problems. *  ROS  DSM5: Level of Care:  OP  Hospital Course:        This is a 42 year old African-American female. Admitted to [facility] from the [facility] with complaints of suicidal ideations and increased depression. Patient reports, "The cops took me to the hospital early Friday morning. I attempted to jump off of a tall building.       [name] was oriented to the unit and encouraged to participate in unit programming. Medical problems were identified and treated appropriately. Home medication was restarted as needed. Psychiatric medication management was initiated.       [name] was evaluated each day by a clinical provider to ascertain the patient's response to treatment.  Improvement was noted by the patient's report of decreasing symptoms, improved sleep and appetite, affect, medication tolerance, behavior, and participation in unit programming.  [name] was asked each day to complete a self inventory noting mood, mental status, pain, new symptoms, anxiety and concerns.         She responded well to medication and being in a therapeutic and supportive environment. Positive and appropriate behavior was noted and the patient was motivated for recovery.  [name] worked closely with the treatment team and case manager to develop a discharge plan with appropriate goals. Coping skills, problem solving as well as relaxation therapies were also part of the unit programming.         By the day of discharge [name]  was in much improved condition than upon admission.  Symptoms were reported as significantly decreased or resolved  completely.  The patient denied SI/HI and voiced no AVH. She was motivated to continue taking medication with a goal of continued improvement in mental health.          [name]  was discharged home with a plan to follow up as noted below.   Consults:  None  Significant Diagnostic Studies:  None  Discharge Vitals:   Blood pressure 107/72, pulse 96, temperature 97.7 F (36.5 C), temperature source Oral, resp. rate 16, height 4\' 9"  (1.448 m), weight 77.111 kg (170 lb), SpO2 100.00%. Body mass index is 36.78 kg/(m^2). Lab Results:   No results found for this or any previous visit (from the past 72 hour(s)).  Physical Findings: AIMS: Facial and Oral Movements Muscles of Facial Expression: None, normal Lips and Perioral Area: None, normal Jaw: None, normal Tongue: None, normal,Extremity Movements Upper (arms, wrists, hands, fingers): None, normal Lower (legs, knees, ankles, toes): None, normal, Trunk Movements Neck, shoulders, hips: None, normal, Overall Severity Severity of abnormal movements (highest score from questions above): None, normal Incapacitation due to abnormal movements: None, normal Patient's awareness of abnormal movements (rate only patient's report): No Awareness, Dental Status Current problems with teeth and/or dentures?: No Does patient usually wear dentures?: No  CIWA:  CIWA-Ar Total: 1 COWS:     Psychiatric Specialty Exam: See Psychiatric Specialty Exam and Suicide Risk Assessment completed by Attending Physician prior to discharge.  Discharge destination:  Home  Is patient on multiple antipsychotic therapies at discharge:  No   Has Patient  had three or more failed trials of antipsychotic monotherapy by history:  No  Recommended Plan for Multiple Antipsychotic Therapies: NA  Discharge Orders   Future Orders Complete By Expires   Diet - low sodium heart healthy  As directed    Discharge instructions  As directed    Comments:     Take all of your  medications as directed. Be sure to keep all of your follow up appointments.  If you are unable to keep your follow up appointment, call your Doctor's office to let them know, and reschedule.  Make sure that you have enough medication to last until your appointment. Be sure to get plenty of rest. Going to bed at the same time each night will help. Try to avoid sleeping during the day.  Increase your activity as tolerated. Regular exercise will help you to sleep better and improve your mental health. Eating a heart healthy diet is recommended. Try to avoid salty or fried foods. Be sure to avoid all alcohol and illegal drugs.   Increase activity slowly  As directed        Medication List    STOP taking these medications       risperiDONE 3 MG tablet  Commonly known as:  RISPERDAL      TAKE these medications     Indication   benztropine 1 MG tablet  Commonly known as:  COGENTIN  Take 1 tablet (1 mg total) by mouth 2 (two) times daily. For prevention of EPS.   Indication:  Extrapyramidal Reaction caused by Medications     divalproex 500 MG 24 hr tablet  Commonly known as:  DEPAKOTE ER  Take one tablet every morning and 2 tablets at bedtime for mood stabilization.   Indication:  Manic Phase of Manic-Depression     FLUoxetine 20 MG capsule  Commonly known as:  PROZAC  Take 1 capsule (20 mg total) by mouth daily. For depression.   Indication:  Depression     QUEtiapine 300 MG 24 hr tablet  Commonly known as:  SEROQUEL XR  Take 1 tablet (300 mg total) by mouth at bedtime. For psychosis and mood stabilization.   Indication:  Manic-Depression     QUEtiapine 25 MG tablet  Commonly known as:  SEROQUEL  Take 1 tablet (25 mg total) by mouth 2 (two) times daily. For anxiety.   Indication:  Manic Phase of Manic-Depression     zolpidem 5 MG tablet  Commonly known as:  AMBIEN  Take 1 tablet (5 mg total) by mouth at bedtime as needed for sleep.   Indication:  Trouble Sleeping            Follow-up Information   Follow up with [facility] [place] On 05/07/2013. (Walk in for hospital discharge appointment, for medication management and therapy.  Walk in clinic is Monday - Friday 8 am - 3 pm.  )    Contact information:   [address], [place] [address] Phone: [phone] Fax: [phone]      Follow-up recommendations:   Activities: Resume activity as tolerated. Diet: Heart healthy low sodium diet Tests: Follow up testing will be determined by your out patient provider. Comments:    Total Discharge Time:  Less than 30 minutes.  Signed: Rona Ravens. Mashburn RPAC 11:04 AM 05/06/2013   Patient was seen face-to-face evaluation, case discussed with the treatment team and made disposition plans and suicide risk assessment. Reviewed the information documented and agree with the  treatment plan.  Hussien Greenblatt,JANARDHAHA R. 05/06/2013 6:16 PM

## 2013-05-10 NOTE — Progress Notes (Signed)
Patient Discharge Instructions:  After Visit Summary (AVS):   Faxed to:  05/10/13 Discharge Summary Note:   Faxed to:  05/10/13 Psychiatric Admission Assessment Note:   Faxed to:  05/10/13 Suicide Risk Assessment - Discharge Assessment:   Faxed to:  05/10/13 Faxed/Sent to the Next Level Care provider:  05/10/13 Faxed to [facility] the [name] @ [phone]  Jerelene Redden, 05/10/2013, 11:58 AM

## 2013-08-08 ENCOUNTER — Emergency Department (HOSPITAL_COMMUNITY)
Admission: EM | Admit: 2013-08-08 | Discharge: 2013-08-08 | Disposition: A | Discharge status: Home / Self Care | Payer: No Typology Code available for payment source | Attending: Emergency Medicine | Admitting: Emergency Medicine

## 2013-08-08 ENCOUNTER — Encounter (HOSPITAL_COMMUNITY): Payer: Self-pay | Admitting: Emergency Medicine

## 2013-08-08 DIAGNOSIS — F3289 Other specified depressive episodes: Secondary | ICD-10-CM | POA: Insufficient documentation

## 2013-08-08 DIAGNOSIS — F329 Major depressive disorder, single episode, unspecified: Secondary | ICD-10-CM | POA: Insufficient documentation

## 2013-08-08 DIAGNOSIS — F411 Generalized anxiety disorder: Secondary | ICD-10-CM | POA: Insufficient documentation

## 2013-08-08 DIAGNOSIS — Z79899 Other long term (current) drug therapy: Secondary | ICD-10-CM | POA: Insufficient documentation

## 2013-08-08 DIAGNOSIS — R110 Nausea: Secondary | ICD-10-CM | POA: Insufficient documentation

## 2013-08-08 DIAGNOSIS — I10 Essential (primary) hypertension: Secondary | ICD-10-CM | POA: Insufficient documentation

## 2013-08-08 DIAGNOSIS — R109 Unspecified abdominal pain: Secondary | ICD-10-CM | POA: Insufficient documentation

## 2013-08-08 DIAGNOSIS — F17200 Nicotine dependence, unspecified, uncomplicated: Secondary | ICD-10-CM | POA: Insufficient documentation

## 2013-08-08 DIAGNOSIS — N739 Female pelvic inflammatory disease, unspecified: Secondary | ICD-10-CM | POA: Insufficient documentation

## 2013-08-08 DIAGNOSIS — A5900 Urogenital trichomoniasis, unspecified: Secondary | ICD-10-CM | POA: Insufficient documentation

## 2013-08-08 DIAGNOSIS — Z3202 Encounter for pregnancy test, result negative: Secondary | ICD-10-CM | POA: Insufficient documentation

## 2013-08-08 DIAGNOSIS — K759 Inflammatory liver disease, unspecified: Secondary | ICD-10-CM | POA: Insufficient documentation

## 2013-08-08 DIAGNOSIS — A599 Trichomoniasis, unspecified: Secondary | ICD-10-CM

## 2013-08-08 DIAGNOSIS — N898 Other specified noninflammatory disorders of vagina: Secondary | ICD-10-CM | POA: Insufficient documentation

## 2013-08-08 DIAGNOSIS — R350 Frequency of micturition: Secondary | ICD-10-CM | POA: Insufficient documentation

## 2013-08-08 LAB — CBC WITH DIFFERENTIAL/PLATELET
Basophils Absolute: 0 10*3/uL (ref 0.0–0.1)
Basophils Relative: 1 % (ref 0–1)
Eosinophils Absolute: 0.2 10*3/uL (ref 0.0–0.7)
Eosinophils Relative: 3 % (ref 0–5)
HCT: 43.1 % (ref 36.0–46.0)
Hemoglobin: 15.1 g/dL — ABNORMAL HIGH (ref 12.0–15.0)
LYMPHS ABS: 2.1 10*3/uL (ref 0.7–4.0)
LYMPHS PCT: 43 % (ref 12–46)
MCH: 37.1 pg — ABNORMAL HIGH (ref 26.0–34.0)
MCHC: 35 g/dL (ref 30.0–36.0)
MCV: 105.9 fL — AB (ref 78.0–100.0)
Monocytes Absolute: 0.5 10*3/uL (ref 0.1–1.0)
Monocytes Relative: 10 % (ref 3–12)
NEUTROS ABS: 2.1 10*3/uL (ref 1.7–7.7)
Neutrophils Relative %: 43 % (ref 43–77)
Platelets: 113 10*3/uL — ABNORMAL LOW (ref 150–400)
RBC: 4.07 MIL/uL (ref 3.87–5.11)
RDW: 12.1 % (ref 11.5–15.5)
WBC: 4.8 10*3/uL (ref 4.0–10.5)

## 2013-08-08 LAB — URINALYSIS, ROUTINE W REFLEX MICROSCOPIC
BILIRUBIN URINE: NEGATIVE
Glucose, UA: NEGATIVE mg/dL
Hgb urine dipstick: NEGATIVE
KETONES UR: NEGATIVE mg/dL
Nitrite: NEGATIVE
Protein, ur: NEGATIVE mg/dL
Specific Gravity, Urine: 1.022 (ref 1.005–1.030)
UROBILINOGEN UA: 0.2 mg/dL (ref 0.0–1.0)
pH: 6.5 (ref 5.0–8.0)

## 2013-08-08 LAB — WET PREP, GENITAL
Clue Cells Wet Prep HPF POC: NONE SEEN
TRICH WET PREP: NONE SEEN
Yeast Wet Prep HPF POC: NONE SEEN

## 2013-08-08 LAB — COMPREHENSIVE METABOLIC PANEL
ALK PHOS: 69 U/L (ref 39–117)
ALT: 60 U/L — AB (ref 0–35)
AST: 43 U/L — ABNORMAL HIGH (ref 0–37)
Albumin: 3.9 g/dL (ref 3.5–5.2)
BUN: 20 mg/dL (ref 6–23)
CHLORIDE: 95 meq/L — AB (ref 96–112)
CO2: 29 mEq/L (ref 19–32)
Calcium: 9 mg/dL (ref 8.4–10.5)
Creatinine, Ser: 0.95 mg/dL (ref 0.50–1.10)
GFR calc non Af Amer: 74 mL/min — ABNORMAL LOW (ref >90)
GFR, EST AFRICAN AMERICAN: 86 mL/min — AB (ref >90)
GLUCOSE: 97 mg/dL (ref 70–99)
POTASSIUM: 4.3 meq/L (ref 3.7–5.3)
Sodium: 133 mEq/L — ABNORMAL LOW (ref 137–147)
Total Bilirubin: <0.2 mg/dL — ABNORMAL LOW (ref 0.3–1.2)
Total Protein: 8.3 g/dL (ref 6.0–8.3)

## 2013-08-08 LAB — URINE MICROSCOPIC-ADD ON

## 2013-08-08 LAB — POCT PREGNANCY, URINE: PREG TEST UR: NEGATIVE

## 2013-08-08 LAB — RPR: RPR Ser Ql: NONREACTIVE

## 2013-08-08 MED ORDER — METRONIDAZOLE 500 MG PO TABS
2000.0000 mg | ORAL_TABLET | Freq: Once | ORAL | Status: AC
  Administered 2013-08-08: 2000 mg via ORAL
  Filled 2013-08-08: qty 4

## 2013-08-08 MED ORDER — DOXYCYCLINE HYCLATE 100 MG PO CAPS: 100.0000 mg | ORAL_CAPSULE | Freq: Two times a day (BID) | ORAL | Status: DC

## 2013-08-08 NOTE — Progress Notes (Signed)
[facility] provided pt with a list of primary care resources and ACA information. Patient stated that she was pending insurance.

## 2013-08-08 NOTE — ED Provider Notes (Signed)
CSN: [id number]     Arrival date & time 08/08/13  0818 History   First MD Initiated Contact with Patient 08/08/13 0825     Chief Complaint  Patient presents with  . Abdominal Pain  . Nausea   (Consider location/radiation/quality/duration/timing/severity/associated sxs/prior Treatment) Patient is a 43 y.o. female presenting with abdominal pain.  Abdominal Pain Associated symptoms: nausea   Associated symptoms: no chest pain, no cough, no dysuria, no fever, no shortness of breath and no vomiting     This a 43 year old female who presents with abdominal pain. Patient reports 2 to three-day history of lower abdominal pain. She reports that it is achy and radiates downward. It is located in her suprapubic region. Current pain is 5/10. Nothing makes it better or worse. She has had nausea without vomiting. She denies any diarrhea or constipation. She denies any vaginal discharge, fever, or dysuria.  Last normal menstrual period was greater than one month ago.  Past Medical History  Diagnosis Date  . Hepatitis   . Hypertension   . Anxiety   . Depression    History reviewed. No pertinent past surgical history. Family History  Problem Relation Age of Onset  . Depression Other    History  Substance Use Topics  . Smoking status: Current Every Day Smoker -- 0.50 packs/day    Types: Cigarettes  . Smokeless tobacco: Never Used  . Alcohol Use: Yes     Comment: occasionally   OB History   Grav Para Term Preterm Abortions TAB SAB Ect Mult Living                 Review of Systems  Constitutional: Negative for fever.  Respiratory: Negative for cough, chest tightness and shortness of breath.   Cardiovascular: Negative for chest pain.  Gastrointestinal: Positive for nausea and abdominal pain. Negative for vomiting.  Genitourinary: Positive for frequency. Negative for dysuria.  Musculoskeletal: Negative for back pain.  Neurological: Negative for headaches.  All other systems reviewed and  are negative.    Allergies  Review of patient's allergies indicates no known allergies.  Home Medications   Current Outpatient Rx  Name  Route  Sig  Dispense  Refill  . divalproex (DEPAKOTE ER) 500 MG 24 hr tablet   Oral   Take 1,000 mg by mouth daily. Take one tablet every morning and 2 tablets at bedtime for mood stabilization.         . hydrOXYzine (VISTARIL) 25 MG capsule   Oral   Take 25 mg by mouth 2 (two) times daily.         . QUEtiapine (SEROQUEL) 400 MG tablet   Oral   Take 400 mg by mouth at bedtime.         [name] doxycycline (VIBRAMYCIN) 100 MG capsule   Oral   Take 1 capsule (100 mg total) by mouth 2 (two) times daily.   20 capsule   0    BP 115/66  Pulse 110  Temp(Src) 97.7 F (36.5 C) (Oral)  Resp 18  SpO2 99%  LMP 08/02/2013 Physical Exam  Nursing note and vitals reviewed. Constitutional: She is oriented to person, place, and time. She appears well-developed and well-nourished. No distress.  HENT:  Head: Normocephalic and atraumatic.  Eyes: Pupils are equal, round, and reactive to light.  Neck: Neck supple.  Cardiovascular: Normal rate, regular rhythm and normal heart sounds.   No murmur heard. Pulmonary/Chest: Effort normal and breath sounds normal. No respiratory distress. She has no wheezes.  Abdominal: Soft. Bowel sounds are normal. There is no tenderness. There is no rebound and no guarding.  Genitourinary:  Large amount of vaginal discharge, parous cervix, no adnexal tenderness, cervical motion tenderness present  Musculoskeletal: She exhibits no edema.  Neurological: She is alert and oriented to person, place, and time.  Skin: Skin is warm and dry.  Psychiatric: She has a normal mood and affect.    ED Course  Procedures (including critical care time) Labs Review Labs Reviewed  WET PREP, GENITAL - Abnormal; Notable for the following:    WBC, Wet Prep HPF POC FEW (*)    All other components within normal limits  CBC WITH  DIFFERENTIAL - Abnormal; Notable for the following:    Hemoglobin 15.1 (*)    MCV 105.9 (*)    MCH 37.1 (*)    Platelets 113 (*)    All other components within normal limits  COMPREHENSIVE METABOLIC PANEL - Abnormal; Notable for the following:    Sodium 133 (*)    Chloride 95 (*)    AST 43 (*)    ALT 60 (*)    Total Bilirubin <0.2 (*)    GFR calc non Af Amer 74 (*)    GFR calc Af Amer 86 (*)    All other components within normal limits  URINALYSIS, ROUTINE W REFLEX MICROSCOPIC - Abnormal; Notable for the following:    APPearance CLOUDY (*)    Leukocytes, UA TRACE (*)    All other components within normal limits  URINE MICROSCOPIC-ADD ON - Abnormal; Notable for the following:    Squamous Epithelial / LPF MANY (*)    All other components within normal limits  RPR  POCT PREGNANCY, URINE   Imaging Review No results found.  EKG Interpretation   None       MDM   1. Abdominal pain   2. Trichomoniasis   3. PID (pelvic inflammatory disease)    She presents with several days of abdominal pain. She is nontoxic-appearing on exam and her abdominal exam is benign.  Pelvic exam is notable for cervical motion tenderness and vaginal discharge.   9:55 AM Patient noted to have Trichomonas in her urine. I have informed the patient and her partner of these results. I have encouraged the patient to be treated empirically for other STDs as well. Patient has agreed. Partner encouraged to seek treatment as well.  Suspect given patient's lower abdominal pain and cervical motion tenderness that her pain may be related to mild PID. We'll send him on a ten-day course of doxycycline.  After history, exam, and medical workup I feel the patient has been appropriately medically screened and is safe for discharge home. Pertinent diagnoses were discussed with the patient. Patient was given return precautions.   Merryl Hacker, MD 08/08/13 1100

## 2013-08-08 NOTE — ED Notes (Signed)
Patiaent c/o lower abdominal pain, urinary frequency, and nausea x 3 days. Patient denies any vaginal discharge, v/d, fever, or dysuria.

## 2013-08-08 NOTE — Discharge Instructions (Signed)
Trichomoniasis Trichomoniasis is an infection, caused by the Trichomonas organism, that affects both women and men. In women, the outer female genitalia and the vagina are affected. In men, the penis is mainly affected, but the prostate and other reproductive organs can also be involved. Trichomoniasis is a sexually transmitted disease (STD) and is most often passed to another person through sexual contact. The majority of people who get trichomoniasis do so from a sexual encounter and are also at risk for other STDs. CAUSES   Sexual intercourse with an infected partner.  It can be present in swimming pools or hot tubs. SYMPTOMS   Abnormal gray-green frothy vaginal discharge in women.  Vaginal itching and irritation in women.  Itching and irritation of the area outside the vagina in women.  Penile discharge with or without pain in males.  Inflammation of the urethra (urethritis), causing painful urination.  Bleeding after sexual intercourse. RELATED COMPLICATIONS  Pelvic inflammatory disease.  Infection of the uterus (endometritis).  Infertility.  Tubal (ectopic) pregnancy.  It can be associated with other STDs, including gonorrhea and chlamydia, hepatitis B, and HIV. COMPLICATIONS DURING PREGNANCY  Early (premature) delivery.  Premature rupture of the membranes (PROM).  Low birth weight. DIAGNOSIS   Visualization of Trichomonas under the microscope from the vagina discharge.  Ph of the vagina greater than 4.5, tested with a test tape.  Trich Rapid Test.  Culture of the organism, but this is not usually needed.  It may be found on a Pap test.  Having a "strawberry cervix,"which means the cervix looks very red like a strawberry. TREATMENT   You may be given medication to fight the infection. Inform your caregiver if you could be or are pregnant. Some medications used to treat the infection should not be taken during pregnancy.  Over-the-counter medications or  creams to decrease itching or irritation may be recommended.  Your sexual partner will need to be treated if infected. HOME CARE INSTRUCTIONS   Take all medication prescribed by your caregiver.  Take over-the-counter medication for itching or irritation as directed by your caregiver.  Do not have sexual intercourse while you have the infection.  Do not douche or wear tampons.  Discuss your infection with your partner, as your partner may have acquired the infection from you. Or, your partner may have been the person who transmitted the infection to you.  Have your sex partner examined and treated if necessary.  Practice safe, informed, and protected sex.  See your caregiver for other STD testing. SEEK MEDICAL CARE IF:   You still have symptoms after you finish the medication.  You have an oral temperature above 102 F (38.9 C).  You develop belly (abdominal) pain.  You have pain when you urinate.  You have bleeding after sexual intercourse.  You develop a rash.  The medication makes you sick or makes you throw up (vomit). Document Released: 01/04/2001 Document Revised: 10/03/2011 Document Reviewed: 01/30/2009 [facility] Patient Information 2014 [place], [place]. Pelvic Inflammatory Disease Pelvic inflammatory disease (PID) refers to an infection in some or all of the female organs. The infection can be in the uterus, ovaries, fallopian tubes, or the surrounding tissues in the pelvis. PID can cause abdominal or pelvic pain that comes on suddenly (acute pelvic pain). PID is a serious infection because it can lead to lasting (chronic) pelvic pain or the inability to have children (infertile).  CAUSES  The infection is often caused by the normal bacteria found in the vaginal tissues. PID may also  be caused by an infection that is spread during sexual contact. PID can also occur following:   The birth of a baby.   A miscarriage.   An abortion.   Major pelvic surgery.    The use of an intrauterine device (IUD).   A sexual assault.  RISK FACTORS Certain factors can put a person at higher risk for PID, such as:  Being younger than 25 years.  Being sexually active at [place] age.  Usingnonbarrier contraception.  Havingmultiple sexual partners.  Having sex with someone who has symptoms of a genital infection.  Using oral contraception. Other times, certain behaviors can increase the possibility of getting PID, such as:  Having sex during your period.  Using a vaginal douche.  Having an intrauterine device (IUD) in place. SYMPTOMS   Abdominal or pelvic pain.   Fever.   Chills.   Abnormal vaginal discharge.  Abnormal uterine bleeding.   Unusual pain shortly after finishing your period. DIAGNOSIS  Your caregiver will choose some of the following methods to make a diagnosis, such as:   Performinga physical exam and history. A pelvic exam typically reveals a very tender uterus and surrounding pelvis.   Ordering laboratory tests including a pregnancy test, blood tests, and urine test.  Orderingcultures of the vagina and cervix to check for a sexually transmitted infection (STI).  Performing an ultrasound.   Performing a laparoscopic procedure to look inside the pelvis.  TREATMENT   Antibiotic medicines may be prescribed and taken by mouth.   Sexual partners may be treated when the infection is caused by a sexually transmitted disease (STD).   Hospitalization may be needed to give antibiotics intravenously.  Surgery may be needed, but this is rare. It may take weeks until you are completely well. If you are diagnosed with PID, you should also be checked for human immunodeficiency virus (HIV). HOME CARE INSTRUCTIONS   If given, take your antibiotics as directed. Finish the medicine even if you start to feel better.   Only take over-the-counter or prescription medicines for pain, discomfort, or fever as  directed by your caregiver.   Do not have sexual intercourse until treatment is completed or as directed by your caregiver. If PID is confirmed, your recent sexual partner(s) will need treatment.   Keep your follow-up appointments. SEEK MEDICAL CARE IF:   You have increased or abnormal vaginal discharge.   You need prescription medicine for your pain.   You vomit.   You cannot take your medicines.   Your partner has an STD.  SEEK IMMEDIATE MEDICAL CARE IF:   You have a fever.   You have increased abdominal or pelvic pain.   You have chills.   You have pain when you urinate.   You are not better after 72 hours following treatment.  MAKE SURE YOU:   Understand these instructions.  Will watch your condition.  Will get help right away if you are not doing well or get worse. Document Released: 07/11/2005 Document Revised: 11/05/2012 Document Reviewed: 07/07/2011 [facility] Patient Information 2014 [place], [place].

## 2013-08-11 NOTE — ED Notes (Signed)
Opened chart at request of pts care provider to answer question regarding prescription and where to obtain the med. Due to cost, [name] recommended.

## 2013-08-16 ENCOUNTER — Emergency Department (HOSPITAL_COMMUNITY)
Admission: EM | Admit: 2013-08-16 | Discharge: 2013-08-16 | Disposition: A | Discharge status: Home / Self Care | Payer: Self-pay | Attending: Emergency Medicine | Admitting: Emergency Medicine

## 2013-08-16 ENCOUNTER — Emergency Department (HOSPITAL_COMMUNITY): Payer: Self-pay

## 2013-08-16 ENCOUNTER — Encounter (HOSPITAL_COMMUNITY): Payer: Self-pay | Admitting: Emergency Medicine

## 2013-08-16 DIAGNOSIS — Z792 Long term (current) use of antibiotics: Secondary | ICD-10-CM | POA: Insufficient documentation

## 2013-08-16 DIAGNOSIS — I10 Essential (primary) hypertension: Secondary | ICD-10-CM | POA: Insufficient documentation

## 2013-08-16 DIAGNOSIS — F3289 Other specified depressive episodes: Secondary | ICD-10-CM | POA: Insufficient documentation

## 2013-08-16 DIAGNOSIS — F411 Generalized anxiety disorder: Secondary | ICD-10-CM | POA: Insufficient documentation

## 2013-08-16 DIAGNOSIS — Z79899 Other long term (current) drug therapy: Secondary | ICD-10-CM | POA: Insufficient documentation

## 2013-08-16 DIAGNOSIS — M25569 Pain in unspecified knee: Secondary | ICD-10-CM | POA: Insufficient documentation

## 2013-08-16 DIAGNOSIS — F17200 Nicotine dependence, unspecified, uncomplicated: Secondary | ICD-10-CM | POA: Insufficient documentation

## 2013-08-16 DIAGNOSIS — M25469 Effusion, unspecified knee: Secondary | ICD-10-CM | POA: Insufficient documentation

## 2013-08-16 DIAGNOSIS — F329 Major depressive disorder, single episode, unspecified: Secondary | ICD-10-CM | POA: Insufficient documentation

## 2013-08-16 DIAGNOSIS — Z8719 Personal history of other diseases of the digestive system: Secondary | ICD-10-CM | POA: Insufficient documentation

## 2013-08-16 DIAGNOSIS — M25562 Pain in left knee: Secondary | ICD-10-CM

## 2013-08-16 MED ORDER — HYDROCODONE-ACETAMINOPHEN 5-325 MG PO TABS: 1.0000 | ORAL_TABLET | Freq: Four times a day (QID) | ORAL | Status: DC | PRN

## 2013-08-16 MED ORDER — HYDROCODONE-ACETAMINOPHEN 5-325 MG PO TABS
2.0000 | ORAL_TABLET | Freq: Once | ORAL | Status: AC
  Administered 2013-08-16: 2 via ORAL
  Filled 2013-08-16: qty 2

## 2013-08-16 MED ORDER — ONDANSETRON HCL 4 MG/2ML IJ SOLN
4.0000 mg | Freq: Once | INTRAMUSCULAR | Status: DC
  Filled 2013-08-16: qty 2

## 2013-08-16 NOTE — Discharge Instructions (Signed)
Your xray was negative. I suspect that you have a meniscal tear. Please Ice and elevate your leg. You will need to see an orthopedist. Cryotherapy Cryotherapy means treatment with cold. Ice or gel packs can be used to reduce both pain and swelling. Ice is the most helpful within the first 24 to 48 hours after an injury or flareup from overusing a muscle or joint. Sprains, strains, spasms, burning pain, shooting pain, and aches can all be eased with ice. Ice can also be used when recovering from surgery. Ice is effective, has very few side effects, and is safe for most people to use. PRECAUTIONS  Ice is not a safe treatment option for people with:  Raynaud's phenomenon. This is a condition affecting small blood vessels in the extremities. Exposure to cold may cause your problems to return.  Cold hypersensitivity. There are many forms of cold hypersensitivity, including:  Cold urticaria. Red, itchy hives appear on the skin when the tissues begin to warm after being iced.  Cold erythema. This is a red, itchy rash caused by exposure to cold.  Cold hemoglobinuria. Red blood cells break down when the tissues begin to warm after being iced. The hemoglobin that carry oxygen are passed into the urine because they cannot combine with blood proteins fast enough.  Numbness or altered sensitivity in the area being iced. If you have any of the following conditions, do not use ice until you have discussed cryotherapy with your caregiver:  Heart conditions, such as arrhythmia, angina, or chronic heart disease.  High blood pressure.  Healing wounds or open skin in the area being iced.  Current infections.  Rheumatoid arthritis.  Poor circulation.  Diabetes. Ice slows the blood flow in the region it is applied. This is beneficial when trying to stop inflamed tissues from spreading irritating chemicals to surrounding tissues. However, if you expose your skin to cold temperatures for too long or  without the proper protection, you can damage your skin or nerves. Watch for signs of skin damage due to cold. HOME CARE INSTRUCTIONS Follow these tips to use ice and cold packs safely.  Place a dry or damp towel between the ice and skin. A damp towel will cool the skin more quickly, so you may need to shorten the time that the ice is used.  For a more rapid response, add gentle compression to the ice.  Ice for no more than 10 to 20 minutes at a time. The bonier the area you are icing, the less time it will take to get the benefits of ice.  Check your skin after 5 minutes to make sure there are no signs of a poor response to cold or skin damage.  Rest 20 minutes or more in between uses.  Once your skin is numb, you can end your treatment. You can test numbness by very lightly touching your skin. The touch should be so light that you do not see the skin dimple from the pressure of your fingertip. When using ice, most people will feel these normal sensations in this order: cold, burning, aching, and numbness.  Do not use ice on someone who cannot communicate their responses to pain, such as small children or people with dementia. HOW TO MAKE AN ICE PACK Ice packs are the most common way to use ice therapy. Other methods include ice massage, ice baths, and cryo-sprays. Muscle creams that cause a cold, tingly feeling do not offer the same benefits that ice offers and should not  be used as a substitute unless recommended by your caregiver. To make an ice pack, do one of the following:  Place crushed ice or a bag of frozen vegetables in a sealable plastic bag. Squeeze out the excess air. Place this bag inside another plastic bag. Slide the bag into a pillowcase or place a damp towel between your skin and the bag.  Mix 3 parts water with 1 part rubbing alcohol. Freeze the mixture in a sealable plastic bag. When you remove the mixture from the freezer, it will be slushy. Squeeze out the excess air.  Place this bag inside another plastic bag. Slide the bag into a pillowcase or place a damp towel between your skin and the bag. SEEK MEDICAL CARE IF:  You develop white spots on your skin. This may give the skin a blotchy (mottled) appearance.  Your skin turns blue or pale.  Your skin becomes waxy or hard.  Your swelling gets worse. MAKE SURE YOU:   Understand these instructions.  Will watch your condition.  Will get help right away if you are not doing well or get worse. Document Released: 03/07/2011 Document Revised: 10/03/2011 Document Reviewed: 03/07/2011 [facility] Patient Information 2014 [place], [place].  Knee, Cartilage (Meniscus) Injury    It is suspected that you have a torn cartilage (meniscus) in your knee. The menisci are made of tough cartilage, and fit between the surfaces of the thigh and leg bones. The menisci are "C"shaped and have a wedged profile. The wedged profile helps the stability of the joint by keeping the rounded femur surface from sliding off the flat tibial surface. The menisci are fed (nourished) by small blood vessels; but there is also a large area at the inner edge of the meniscus that does not have a good blood supply (avascular). This presents a problem when there is an injury to the meniscus, because areas without good blood supply heal poorly. As a result when there is a torn cartilage in the knee, surgery is often required to fix it. This is usually done with a surgical procedure less invasive than open surgery (arthroscopy). Some times open surgery of the knee is required if there is other damage.  PURPOSE OF THE MENISCUS  The medial meniscus rests on the medial tibial plateau. The tibia is the large bone in your lower leg (the shin bone). The medial tibial plateau is the upper end of the bone making up the inner part of your knee. The lateral meniscus serves the same purpose and is located on the outside of the knee. The menisci help to distribute your  body weight across the knee joint; they act as shock absorbers. Without the meniscus present, the weight of your body would be unevenly applied to the bones in your legs (the femur and tibia). The femur is the large bone in your thigh. This uneven weight distribution would cause increased wear and tear on the cartilage lining the joint surfaces, leading to early damage (arthritis) of these areas. The presence of the menisci cartilage is necessary for a healthy knee.  PURPOSE OF THE KNEE CARTILAGE  The knee joint is made up of three bones: the thigh bone (femur), the shin bone (tibia), and the knee cap (patella). The surfaces of these bones at the knee joint are covered with cartilage called articular cartilage. This smooth slippery surface allows the bones to slide against each other without causing bone damage. The meniscus sits between these cartilaginous surfaces of the bones. It distributes the  weight evenly in the joints and helps with the stability of the joint (keeps the joint steady).  HOME CARE INSTRUCTIONS  Use crutches and external braces as instructed.  Once home an ice pack applied to your injured knee may help with discomfort and keep the swelling down. An ice pack can be used for the first couple of days or as instructed.  Only take over-the-counter or prescription medicines for pain, discomfort, or fever as directed by your caregiver.  Call if you do not have relief of pain with medications or if there is increasing in pain.  Call if your foot becomes cold or blue.  You may resume normal diet and activities as directed.  Make sure to keep your appointment with your follow-up caregiver. This injury may require further evaluation and treatment beyond the temporary treatment given today. Document Released: 10/01/2002 Document Revised: 10/03/2011 Document Reviewed: 01/23/2009  [facility] Patient Information 2014 [place], [place].

## 2013-08-16 NOTE — ED Notes (Signed)
Pt educated on proper use of crutches however refuses to use them correctly at time of discharge. Pt carries crutches out of ED in hand instead of using them to ambulate with.

## 2013-08-16 NOTE — ED Notes (Signed)
Pt c/o of fall 3 months ago on left knee. Pain 10/10. States left knee is swelling. Ibuprofen with no relief.

## 2013-08-16 NOTE — ED Provider Notes (Signed)
CSN: [id number]     Arrival date & time 08/16/13  1745 History  This chart was scribed for non-physician practitioner, Margarita Mail, PA-C,working with Blanchard Kelch, MD, by Marlowe Kays, ED Scribe.  This patient was seen in room WTR6/WTR6 and the patient's care was started at 7:13 PM.  Chief Complaint  Patient presents with  . Knee Pain   The history is provided by the patient. No language interpreter was used.   HPI Comments:  [name] is a 43 y.o. female who presents to the Emergency Department complaining of worsening left knee pain and swelling secondary to a fall that occurred three months ago. She states the knee has locked up and pops on occasion. She reports that the pain radiates up her thigh. She reports the bones feel like they are rubbing together. She reports taking Ibuprofen with no relief. She denies numbness or tingling.   Past Medical History  Diagnosis Date  . Hepatitis   . Hypertension   . Anxiety   . Depression    No past surgical history on file. Family History  Problem Relation Age of Onset  . Depression Other    History  Substance Use Topics  . Smoking status: Current Every Day Smoker -- 0.50 packs/day    Types: Cigarettes  . Smokeless tobacco: Never Used  . Alcohol Use: Yes     Comment: occasionally   OB History   Grav Para Term Preterm Abortions TAB SAB Ect Mult Living                 Review of Systems  Musculoskeletal: Positive for joint swelling (left knee).  Neurological: Negative for weakness and numbness.    Allergies  Review of patient's allergies indicates no known allergies.  Home Medications   Current Outpatient Rx  Name  Route  Sig  Dispense  Refill  . divalproex (DEPAKOTE ER) 500 MG 24 hr tablet   Oral   Take 1,000 mg by mouth daily. Take one tablet every morning and 2 tablets at bedtime for mood stabilization.         [name] doxycycline (VIBRAMYCIN) 100 MG capsule   Oral   Take 1 capsule (100 mg total) by mouth 2  (two) times daily.   20 capsule   0   . hydrOXYzine (VISTARIL) 25 MG capsule   Oral   Take 25 mg by mouth 2 (two) times daily.         . QUEtiapine (SEROQUEL) 400 MG tablet   Oral   Take 400 mg by mouth at bedtime.          Triage Vitals: BP 147/99  Pulse 94  Temp(Src) 98.9 F (37.2 C) (Oral)  Resp 18  SpO2 100%  LMP 08/02/2013 Physical Exam  Nursing note and vitals reviewed. Constitutional: She is oriented to person, place, and time. She appears well-developed and well-nourished.  HENT:  Head: Normocephalic and atraumatic.  Eyes: EOM are normal.  Neck: Normal range of motion.  Cardiovascular: Normal rate.   Pulmonary/Chest: Effort normal.  Musculoskeletal: Normal range of motion.  Neurological: She is alert and oriented to person, place, and time.  Skin: Skin is warm and dry.  Psychiatric: She has a normal mood and affect. Her behavior is normal.    ED Course  Procedures (including critical care time) DIAGNOSTIC STUDIES: Oxygen Saturation is 100% on RA, normal by my interpretation.   COORDINATION OF CARE: 7:19 PM- Will give injection of pain medication and get left  knee X-Ray. Pt verbalizes understanding and agrees to plan.  Medications - No data to display  Labs Review Labs Reviewed - No data to display Imaging Review Dg Knee Complete 4 Views Left  08/16/2013   CLINICAL DATA:  Left knee pain and swelling  EXAM: LEFT KNEE - COMPLETE 4+ VIEW  COMPARISON:  09/20/2012  FINDINGS: There is no evidence of fracture, dislocation, or joint effusion. There is no evidence of arthropathy or other focal bone abnormality. Soft tissues are unremarkable.  IMPRESSION: Negative.   Electronically Signed   By: Kerby Moors M.D.   On: 08/16/2013 19:48    EKG Interpretation   None       MDM   1. Knee pain, left    Patient with left knee pain I suspect meniscal tear. Patient X-Ray negative for obvious fracture or dislocation. Pain managed in ED. Pt advised to follow up  with orthopedics if symptoms persist for possibility of missed fracture diagnosis. Patient given brace while in ED, conservative therapy recommended and discussed. Patient will be dc home & is agreeable with above plan.   I personally performed the services described in this documentation, which was scribed in my presence. The recorded information has been reviewed and is accurate.    Margarita Mail, PA-C 08/17/13 [phone]

## 2013-08-17 NOTE — ED Provider Notes (Signed)
Medical screening examination/treatment/procedure(s) were performed by non-physician practitioner and as supervising physician I was immediately available for consultation/collaboration.  EKG Interpretation   None         Blanchard Kelch, MD 08/17/13 1139

## 2013-08-29 ENCOUNTER — Emergency Department (HOSPITAL_COMMUNITY)
Admission: EM | Admit: 2013-08-29 | Discharge: 2013-08-29 | Disposition: A | Discharge status: Home / Self Care | Payer: No Typology Code available for payment source | Attending: Emergency Medicine | Admitting: Emergency Medicine

## 2013-08-29 ENCOUNTER — Encounter (HOSPITAL_COMMUNITY): Payer: Self-pay | Admitting: Emergency Medicine

## 2013-08-29 ENCOUNTER — Emergency Department (HOSPITAL_COMMUNITY): Payer: No Typology Code available for payment source

## 2013-08-29 DIAGNOSIS — N939 Abnormal uterine and vaginal bleeding, unspecified: Secondary | ICD-10-CM

## 2013-08-29 DIAGNOSIS — F17200 Nicotine dependence, unspecified, uncomplicated: Secondary | ICD-10-CM | POA: Insufficient documentation

## 2013-08-29 DIAGNOSIS — D259 Leiomyoma of uterus, unspecified: Secondary | ICD-10-CM | POA: Insufficient documentation

## 2013-08-29 DIAGNOSIS — Z792 Long term (current) use of antibiotics: Secondary | ICD-10-CM | POA: Insufficient documentation

## 2013-08-29 DIAGNOSIS — F329 Major depressive disorder, single episode, unspecified: Secondary | ICD-10-CM | POA: Insufficient documentation

## 2013-08-29 DIAGNOSIS — Z3202 Encounter for pregnancy test, result negative: Secondary | ICD-10-CM | POA: Insufficient documentation

## 2013-08-29 DIAGNOSIS — D219 Benign neoplasm of connective and other soft tissue, unspecified: Secondary | ICD-10-CM

## 2013-08-29 DIAGNOSIS — F411 Generalized anxiety disorder: Secondary | ICD-10-CM | POA: Insufficient documentation

## 2013-08-29 DIAGNOSIS — Z79899 Other long term (current) drug therapy: Secondary | ICD-10-CM | POA: Insufficient documentation

## 2013-08-29 DIAGNOSIS — Z8719 Personal history of other diseases of the digestive system: Secondary | ICD-10-CM | POA: Insufficient documentation

## 2013-08-29 DIAGNOSIS — I10 Essential (primary) hypertension: Secondary | ICD-10-CM | POA: Insufficient documentation

## 2013-08-29 DIAGNOSIS — F3289 Other specified depressive episodes: Secondary | ICD-10-CM | POA: Insufficient documentation

## 2013-08-29 LAB — URINALYSIS, ROUTINE W REFLEX MICROSCOPIC
Bilirubin Urine: NEGATIVE
Glucose, UA: NEGATIVE mg/dL
Ketones, ur: NEGATIVE mg/dL
NITRITE: NEGATIVE
PROTEIN: 100 mg/dL — AB
SPECIFIC GRAVITY, URINE: 1.01 (ref 1.005–1.030)
UROBILINOGEN UA: 0.2 mg/dL (ref 0.0–1.0)
pH: 6 (ref 5.0–8.0)

## 2013-08-29 LAB — CBC WITH DIFFERENTIAL/PLATELET
BASOS PCT: 0 % (ref 0–1)
Basophils Absolute: 0 10*3/uL (ref 0.0–0.1)
EOS ABS: 0.4 10*3/uL (ref 0.0–0.7)
Eosinophils Relative: 7 % — ABNORMAL HIGH (ref 0–5)
HCT: 38.1 % (ref 36.0–46.0)
Hemoglobin: 13.3 g/dL (ref 12.0–15.0)
Lymphocytes Relative: 42 % (ref 12–46)
Lymphs Abs: 2.2 10*3/uL (ref 0.7–4.0)
MCH: 36.5 pg — AB (ref 26.0–34.0)
MCHC: 34.9 g/dL (ref 30.0–36.0)
MCV: 104.7 fL — ABNORMAL HIGH (ref 78.0–100.0)
Monocytes Absolute: 0.4 10*3/uL (ref 0.1–1.0)
Monocytes Relative: 7 % (ref 3–12)
Neutro Abs: 2.3 10*3/uL (ref 1.7–7.7)
Neutrophils Relative %: 44 % (ref 43–77)
PLATELETS: 130 10*3/uL — AB (ref 150–400)
RBC: 3.64 MIL/uL — AB (ref 3.87–5.11)
RDW: 12.3 % (ref 11.5–15.5)
WBC: 5.2 10*3/uL (ref 4.0–10.5)

## 2013-08-29 LAB — BASIC METABOLIC PANEL
BUN: 10 mg/dL (ref 6–23)
CALCIUM: 8.7 mg/dL (ref 8.4–10.5)
CO2: 25 mEq/L (ref 19–32)
Chloride: 105 mEq/L (ref 96–112)
Creatinine, Ser: 0.74 mg/dL (ref 0.50–1.10)
GFR calc Af Amer: >90 mL/min (ref >90)
Glucose, Bld: 67 mg/dL — ABNORMAL LOW (ref 70–99)
POTASSIUM: 4.8 meq/L (ref 3.7–5.3)
SODIUM: 140 meq/L (ref 137–147)

## 2013-08-29 LAB — URINE MICROSCOPIC-ADD ON

## 2013-08-29 LAB — WET PREP, GENITAL
TRICH WET PREP: NONE SEEN
YEAST WET PREP: NONE SEEN

## 2013-08-29 LAB — POCT PREGNANCY, URINE: Preg Test, Ur: NEGATIVE

## 2013-08-29 MED ORDER — MORPHINE SULFATE 4 MG/ML IJ SOLN
4.0000 mg | Freq: Once | INTRAMUSCULAR | Status: AC
  Administered 2013-08-29: 4 mg via INTRAVENOUS
  Filled 2013-08-29: qty 1

## 2013-08-29 MED ORDER — HYDROCODONE-ACETAMINOPHEN 5-325 MG PO TABS: 2.0000 | ORAL_TABLET | Freq: Four times a day (QID) | ORAL | Status: DC | PRN

## 2013-08-29 MED ORDER — OXYCODONE-ACETAMINOPHEN 5-325 MG PO TABS: 2.0000 | ORAL_TABLET | Freq: Once | ORAL | Status: DC

## 2013-08-29 MED ORDER — ONDANSETRON HCL 4 MG/2ML IJ SOLN
4.0000 mg | Freq: Once | INTRAMUSCULAR | Status: AC
  Administered 2013-08-29: 4 mg via INTRAVENOUS
  Filled 2013-08-29: qty 2

## 2013-08-29 MED ORDER — SODIUM CHLORIDE 0.9 % IV BOLUS (SEPSIS)
1000.0000 mL | Freq: Once | INTRAVENOUS | Status: AC
  Administered 2013-08-29: 1000 mL via INTRAVENOUS

## 2013-08-29 NOTE — ED Notes (Addendum)
Lower abdominal pain, nausea, and breast tenderness since yesterday.  Denies vomiting and diarrhea.  Patient state that last month she had an abnormal period lasting only 1 day.  Today she states that her vaginal bleeding is abnormal, states that there is dark red blood with some clots, and that she went through 4 pads last night.  Also states that she fell and landed on her left leg 2 weeks ago and thinks she tore a ligament.

## 2013-08-29 NOTE — ED Notes (Signed)
Patient stated she voided in [place] and is unable to provide specimen at this time

## 2013-08-29 NOTE — ED Notes (Signed)
[name], [name] and [name] at bedside to perform pelvic exam

## 2013-08-29 NOTE — ED Notes (Signed)
Patient transported to Ultrasound 

## 2013-08-29 NOTE — ED Provider Notes (Signed)
Medical screening examination/treatment/procedure(s) were performed by non-physician practitioner and as supervising physician I was immediately available for consultation/collaboration.  EKG Interpretation   None       Rolland Porter, MD, Abram Sander   Janice Norrie, MD 08/29/13 [phone]

## 2013-08-29 NOTE — ED Notes (Signed)
Patient returned from US.

## 2013-08-29 NOTE — Discharge Instructions (Signed)
Fibroids Fibroids are lumps (tumors) that can occur any place in a woman's body. These lumps are not cancerous. Fibroids vary in size, weight, and where they grow. HOME CARE  Do not take aspirin.  Write down the number of pads or tampons you use during your period. Tell your doctor. This can help determine the best treatment for you. GET HELP RIGHT AWAY IF:  You have pain in your lower belly (abdomen) that is not helped with medicine.  You have cramps that are not helped with medicine.  You have more bleeding between or during your period.  You feel lightheaded or pass out (faint).  Your lower belly pain gets worse. MAKE SURE YOU:  Understand these instructions.  Will watch your condition.  Will get help right away if you are not doing well or get worse. Document Released: 08/13/2010 Document Revised: 10/03/2011 Document Reviewed: 08/13/2010 [employer] Patient Information 2014 [employer].  

## 2013-08-29 NOTE — ED Provider Notes (Signed)
CSN: [id number]     Arrival date & time 08/29/13  3244 History   First MD Initiated Contact with Patient 08/29/13 [phone]     Chief Complaint  Patient presents with  . Vaginal Bleeding   (Consider location/radiation/quality/duration/timing/severity/associated sxs/prior Treatment) HPI Comments: Patient presents to the emergency department with chief complaint of lower bowel pain, and vaginal bleeding. She states that she's been having symptoms for 2 days. She also reports associated nausea, but denies any vomiting. Denies any diarrhea, constipation, or dysuria. She states the last month she had an irregular cycle, and now this month she is having more bleeding than normal. She had her tubes tied many years ago. She is complaining of moderate pain. Denies any fevers chills.  The history is provided by the patient. No language interpreter was used.    Past Medical History  Diagnosis Date  . Hepatitis   . Hypertension   . Anxiety   . Depression    No past surgical history on file. Family History  Problem Relation Age of Onset  . Depression Other    History  Substance Use Topics  . Smoking status: Current Every Day Smoker -- 0.50 packs/day    Types: Cigarettes  . Smokeless tobacco: Never Used  . Alcohol Use: No   OB History   Grav Para Term Preterm Abortions TAB SAB Ect Mult Living                 Review of Systems  All other systems reviewed and are negative.    Allergies  Review of patient's allergies indicates no known allergies.  Home Medications   Current Outpatient Rx  Name  Route  Sig  Dispense  Refill  . divalproex (DEPAKOTE ER) 500 MG 24 hr tablet   Oral   Take 1,000 mg by mouth daily. Take one tablet every morning and 2 tablets at bedtime for mood stabilization.         [name] doxycycline (VIBRAMYCIN) 100 MG capsule   Oral   Take 1 capsule (100 mg total) by mouth 2 (two) times daily.   20 capsule   0   . HYDROcodone-acetaminophen (NORCO) 5-325 MG per tablet    Oral   Take 1-2 tablets by mouth every 6 (six) hours as needed for moderate pain.   20 tablet   0   . hydrOXYzine (VISTARIL) 25 MG capsule   Oral   Take 25 mg by mouth 2 (two) times daily.         . QUEtiapine (SEROQUEL) 400 MG tablet   Oral   Take 400 mg by mouth at bedtime.          BP 132/69  Pulse 88  Temp(Src) 98.6 F (37 C)  Ht 5\' 1"  (1.549 m)  Wt 182 lb (82.555 kg)  BMI 34.41 kg/m2  SpO2 99%  LMP 08/02/2013 Physical Exam  Nursing note and vitals reviewed. Constitutional: She is oriented to person, place, and time. She appears well-developed and well-nourished.  HENT:  Head: Normocephalic and atraumatic.  Eyes: Conjunctivae and EOM are normal. Pupils are equal, round, and reactive to light.  Neck: Normal range of motion. Neck supple.  Cardiovascular: Normal rate and regular rhythm.  Exam reveals no gallop and no friction rub.   No murmur heard. Pulmonary/Chest: Effort normal and breath sounds normal. No respiratory distress. She has no wheezes. She has no rales. She exhibits no tenderness.  Abdominal: Soft. Bowel sounds are normal. She exhibits no distension and no mass.  There is no tenderness. There is no rebound and no guarding.  Left-sided pelvic pain, No focal abdominal tenderness, no RLQ tenderness or pain at McBurney's point, no RUQ tenderness or Murphy's sign, no left-sided abdominal tenderness, no fluid wave, or signs of peritonitis   Genitourinary: No labial fusion. There is no rash, tenderness, lesion or injury on the right labia. There is no rash, tenderness, lesion or injury on the left labia. Uterus is tender. Uterus is not deviated, not enlarged and not fixed. Cervix exhibits no motion tenderness, no discharge and no friability. Right adnexum displays no mass, no tenderness and no fullness. Left adnexum displays tenderness. Left adnexum displays no mass and no fullness. There is bleeding around the vagina. No erythema or tenderness around the vagina. No  foreign body around the vagina. No signs of injury around the vagina. No vaginal discharge found.  Chaperone present during exam  Musculoskeletal: Normal range of motion. She exhibits no edema and no tenderness.  Neurological: She is alert and oriented to person, place, and time.  Skin: Skin is warm and dry.  Psychiatric: She has a normal mood and affect. Her behavior is normal. Judgment and thought content normal.    ED Course  Procedures (including critical care time) Results for orders placed during the hospital encounter of 08/29/13  WET PREP, GENITAL      Result Value Range   Yeast Wet Prep HPF POC NONE SEEN  NONE SEEN   Trich, Wet Prep NONE SEEN  NONE SEEN   Clue Cells Wet Prep HPF POC FEW (*) NONE SEEN   WBC, Wet Prep HPF POC FEW (*) NONE SEEN  CBC WITH DIFFERENTIAL      Result Value Range   WBC 5.2  4.0 - 10.5 K/uL   RBC 3.64 (*) 3.87 - 5.11 MIL/uL   Hemoglobin 13.3  12.0 - 15.0 g/dL   HCT 38.1  36.0 - 46.0 %   MCV 104.7 (*) 78.0 - 100.0 fL   MCH 36.5 (*) 26.0 - 34.0 pg   MCHC 34.9  30.0 - 36.0 g/dL   RDW 12.3  11.5 - 15.5 %   Platelets 130 (*) 150 - 400 K/uL   Neutrophils Relative % 44  43 - 77 %   Neutro Abs 2.3  1.7 - 7.7 K/uL   Lymphocytes Relative 42  12 - 46 %   Lymphs Abs 2.2  0.7 - 4.0 K/uL   Monocytes Relative 7  3 - 12 %   Monocytes Absolute 0.4  0.1 - 1.0 K/uL   Eosinophils Relative 7 (*) 0 - 5 %   Eosinophils Absolute 0.4  0.0 - 0.7 K/uL   Basophils Relative 0  0 - 1 %   Basophils Absolute 0.0  0.0 - 0.1 K/uL  BASIC METABOLIC PANEL      Result Value Range   Sodium 140  137 - 147 mEq/L   Potassium 4.8  3.7 - 5.3 mEq/L   Chloride 105  96 - 112 mEq/L   CO2 25  19 - 32 mEq/L   Glucose, Bld 67 (*) 70 - 99 mg/dL   BUN 10  6 - 23 mg/dL   Creatinine, Ser 0.74  0.50 - 1.10 mg/dL   Calcium 8.7  8.4 - 10.5 mg/dL   GFR calc non Af Amer >90  >90 mL/min   GFR calc Af Amer >90  >90 mL/min  POCT PREGNANCY, URINE      Result Value Range   Preg Test, Ur  NEGATIVE   NEGATIVE   US Transvaginal Non-ob  08/29/2013   CLINICAL DATA:  Pelvic pain, questionable ovarian torsion.  EXAM: TRANSABDOMINAL ULTRASOUND OF PELVIS  DOPPLER ULTRASOUND OF OVARIES  TECHNIQUE: Transabdominal ultrasound examination of the pelvis was performed including evaluation of the uterus, ovaries, adnexal regions, and pelvic cul-de-sac.  Color and duplex Doppler ultrasound was utilized to evaluate blood flow to the ovaries.  COMPARISON:  None.  FINDINGS: Uterus  Measurements: 9.0 x 5.0 x 5.6 cm. There is a fibroid measuring 1.7 x 1.3 x 1.7 cm to the right of midline posteriorly.  Endometrium  Thickness: 4.9 mm  No focal abnormality visualized.  Right ovary  Measurements: 2.9 x 1.5 x 2.3 cm Normal appearance/no adnexal mass.  Left ovary  Measurements: 2.9 x 2.0 x 2.0 cm Normal appearance/no adnexal mass.  Pulsed Doppler evaluation demonstrates normal low-resistance arterial and venous waveforms in both ovaries.  IMPRESSION: 1. There is no evidence of ovarian torsion or other acute adnexal abnormality. 2. There is a small fibroid in the posterior aspect of the uterine fundus.   Electronically Signed   By: David  Martinique   On: 08/29/2013 12:36   US Pelvis Complete  08/29/2013   CLINICAL DATA:  Pelvic pain, questionable ovarian torsion.  EXAM: TRANSABDOMINAL ULTRASOUND OF PELVIS  DOPPLER ULTRASOUND OF OVARIES  TECHNIQUE: Transabdominal ultrasound examination of the pelvis was performed including evaluation of the uterus, ovaries, adnexal regions, and pelvic cul-de-sac.  Color and duplex Doppler ultrasound was utilized to evaluate blood flow to the ovaries.  COMPARISON:  None.  FINDINGS: Uterus  Measurements: 9.0 x 5.0 x 5.6 cm. There is a fibroid measuring 1.7 x 1.3 x 1.7 cm to the right of midline posteriorly.  Endometrium  Thickness: 4.9 mm  No focal abnormality visualized.  Right ovary  Measurements: 2.9 x 1.5 x 2.3 cm Normal appearance/no adnexal mass.  Left ovary  Measurements: 2.9 x 2.0 x 2.0 cm Normal  appearance/no adnexal mass.  Pulsed Doppler evaluation demonstrates normal low-resistance arterial and venous waveforms in both ovaries.  IMPRESSION: 1. There is no evidence of ovarian torsion or other acute adnexal abnormality. 2. There is a small fibroid in the posterior aspect of the uterine fundus.   Electronically Signed   By: David  Martinique   On: 08/29/2013 12:36   [place] Art/ven Flow Abd Pelv Doppler  08/29/2013   CLINICAL DATA:  Pelvic pain, questionable ovarian torsion.  EXAM: TRANSABDOMINAL ULTRASOUND OF PELVIS  DOPPLER ULTRASOUND OF OVARIES  TECHNIQUE: Transabdominal ultrasound examination of the pelvis was performed including evaluation of the uterus, ovaries, adnexal regions, and pelvic cul-de-sac.  Color and duplex Doppler ultrasound was utilized to evaluate blood flow to the ovaries.  COMPARISON:  None.  FINDINGS: Uterus  Measurements: 9.0 x 5.0 x 5.6 cm. There is a fibroid measuring 1.7 x 1.3 x 1.7 cm to the right of midline posteriorly.  Endometrium  Thickness: 4.9 mm  No focal abnormality visualized.  Right ovary  Measurements: 2.9 x 1.5 x 2.3 cm Normal appearance/no adnexal mass.  Left ovary  Measurements: 2.9 x 2.0 x 2.0 cm Normal appearance/no adnexal mass.  Pulsed Doppler evaluation demonstrates normal low-resistance arterial and venous waveforms in both ovaries.  IMPRESSION: 1. There is no evidence of ovarian torsion or other acute adnexal abnormality. 2. There is a small fibroid in the posterior aspect of the uterine fundus.   Electronically Signed   By: David  Martin[place]   On: 08/29/2013 12:36   Dg Knee Complete 4 Views  Left  08/16/2013   CLINICAL DATA:  Left knee pain and swelling  EXAM: LEFT KNEE - COMPLETE 4+ VIEW  COMPARISON:  09/20/2012  FINDINGS: There is no evidence of fracture, dislocation, or joint effusion. There is no evidence of arthropathy or other focal bone abnormality. Soft tissues are unremarkable.  IMPRESSION: Negative.   Electronically Signed   By: Kerby Moors M.D.   On:  08/16/2013 19:48     EKG Interpretation   None       MDM   1. Vaginal bleeding   2. Fibroids     Patient with pelvic pain, and vaginal bleeding. Will check labs, ultrasound, pelvic exam.  Pelvic exam is remarkable for vaginal bleeding, and uterine and left adnexal tenderness. Ultrasound was negative for torsion. There is a uterine fibroid that is noted on ultrasound. Will discharge with some pain medicine, and OB/GYN followup. Otherwise, labs are reassuring. H&H is stable. Patient is not in any apparent distress.   Montine Circle, PA-C 08/29/13 1315

## 2013-08-30 LAB — GC/CHLAMYDIA PROBE AMP
CT Probe RNA: NEGATIVE
GC PROBE AMP APTIMA: NEGATIVE

## 2013-09-05 ENCOUNTER — Encounter (HOSPITAL_COMMUNITY): Payer: Self-pay | Admitting: Emergency Medicine

## 2013-09-05 ENCOUNTER — Emergency Department (HOSPITAL_COMMUNITY)
Admission: EM | Admit: 2013-09-05 | Discharge: 2013-09-05 | Disposition: A | Discharge status: Home / Self Care | Payer: No Typology Code available for payment source | Attending: Emergency Medicine | Admitting: Emergency Medicine

## 2013-09-05 DIAGNOSIS — G8911 Acute pain due to trauma: Secondary | ICD-10-CM | POA: Insufficient documentation

## 2013-09-05 DIAGNOSIS — M25569 Pain in unspecified knee: Secondary | ICD-10-CM | POA: Insufficient documentation

## 2013-09-05 DIAGNOSIS — F411 Generalized anxiety disorder: Secondary | ICD-10-CM | POA: Insufficient documentation

## 2013-09-05 DIAGNOSIS — I10 Essential (primary) hypertension: Secondary | ICD-10-CM | POA: Insufficient documentation

## 2013-09-05 DIAGNOSIS — F17200 Nicotine dependence, unspecified, uncomplicated: Secondary | ICD-10-CM | POA: Insufficient documentation

## 2013-09-05 DIAGNOSIS — F3289 Other specified depressive episodes: Secondary | ICD-10-CM | POA: Insufficient documentation

## 2013-09-05 DIAGNOSIS — F329 Major depressive disorder, single episode, unspecified: Secondary | ICD-10-CM | POA: Insufficient documentation

## 2013-09-05 DIAGNOSIS — Z8719 Personal history of other diseases of the digestive system: Secondary | ICD-10-CM | POA: Insufficient documentation

## 2013-09-05 MED ORDER — NAPROXEN 500 MG PO TABS: 500.0000 mg | ORAL_TABLET | Freq: Two times a day (BID) | ORAL | Status: DC

## 2013-09-05 NOTE — ED Notes (Signed)
The pt is upset because she is not getting a prescription for vicodin, edpa is aware. social work has helped refer the pt to an ortho appt, pt has been instructed to f/u with ortho md.

## 2013-09-05 NOTE — ED Provider Notes (Signed)
CSN: [id number]     Arrival date & time 09/05/13  1019 History  This chart was scribed for Quincy Carnes, PA working with Virgel Manifold, MD by Roxan Diesel, ED Scribe. This patient was seen in room TR11C/TR11C and the patient's care was started at 11:04 AM.   Chief Complaint  Patient presents with  . Knee Pain    The history is provided by the patient. No language interpreter was used.   HPI Comments: [name] is a 43 y.o. female who presents to the Emergency Department complaining of several months of left knee pain.  Patient states that she fell on the knee several months ago and tore ligaments in the knee.  She went to [facility] and has an appointment with them today, but reports that because she has just gotten insurance they have to run her insurance to check for coverage and were unable to see her today.  She has another appointment scheduled.  Pt states her pain is severe and she is requesting pain medication until she can be seen again by her orthopedist.  Denies any recent injuries, trauma, or falls since prior evaluation. Pt has been wearing knee sleeve and taking OTC meds without improvement.  Pt was given knee immobilizer and crutches at previous visit but states she no longer has these.  Pt states she was previously given vicodin but this did not work for her.  VS stable on arrival.   Past Medical History  Diagnosis Date  . Hepatitis   . Hypertension   . Anxiety   . Depression     History reviewed. No pertinent past surgical history.   Family History  Problem Relation Age of Onset  . Depression Other     History  Substance Use Topics  . Smoking status: Current Every Day Smoker -- 0.50 packs/day    Types: Cigarettes  . Smokeless tobacco: Never Used  . Alcohol Use: No    OB History   Grav Para Term Preterm Abortions TAB SAB Ect Mult Living                   Review of Systems  Musculoskeletal: Positive for arthralgias (left knee).  All other  systems reviewed and are negative.      Allergies  Review of patient's allergies indicates no known allergies.  Home Medications   Current Outpatient Rx  Name  Route  Sig  Dispense  Refill  . divalproex (DEPAKOTE ER) 500 MG 24 hr tablet   Oral   Take 500-1,000 mg by mouth 2 (two) times daily. Take one tablet every morning and 2 tablets at bedtime for mood stabilization.         . hydrOXYzine (VISTARIL) 25 MG capsule   Oral   Take 25 mg by mouth 2 (two) times daily as needed for anxiety.          [name] QUEtiapine (SEROQUEL) 400 MG tablet   Oral   Take 400 mg by mouth at bedtime.          BP 121/75  Pulse 79  Temp(Src) 98 F (36.7 C) (Oral)  Resp 20  Ht 5' (1.524 m)  Wt 182 lb (82.555 kg)  BMI 35.54 kg/m2  SpO2 100%  LMP 08/02/2013  Physical Exam  Nursing note and vitals reviewed. Constitutional: She is oriented to person, place, and time. She appears well-developed and well-nourished. No distress.  HENT:  Head: Normocephalic and atraumatic.  Mouth/Throat: Oropharynx is clear and moist.  Eyes: Conjunctivae and EOM are normal. Pupils are equal, round, and reactive to light.  Neck: Normal range of motion.  Cardiovascular: Normal rate, regular rhythm and normal heart sounds.   Pulmonary/Chest: Effort normal and breath sounds normal. No respiratory distress. She has no wheezes.  Musculoskeletal:       Left knee: She exhibits decreased range of motion. She exhibits no swelling, no effusion, no ecchymosis, no deformity and no laceration. Tenderness found. Medial joint line and lateral joint line tenderness noted.  Left knee with diffuse tenderness to palpation, worse along lateral joint line.  No tendon laxity.  No gross deformity.  Strong distal pulse.  Sensation diffusely intact throughout extremity.  Neurological: She is alert and oriented to person, place, and time.  Skin: Skin is warm and dry. She is not diaphoretic.  Psychiatric: She has a normal mood and affect.     ED Course  Procedures (including critical care time)  DIAGNOSTIC STUDIES: Oxygen Saturation is 100% on room air, normal by my interpretation.    COORDINATION OF CARE: 11:08 AM-Discussed treatment plan which includes pain medication and f/u with orthopedics with pt at bedside and pt agreed to plan.   Labs Review Labs Reviewed - No data to display  Imaging Review No results found.  EKG Interpretation   None       MDM   Final diagnoses:  Knee pain   Pt with chronic left knee pain following fall 3 months ago.  She has been referred to orthopedics and states she went to [facility] this morning for scheduled appt but was declined due to her insurance.  States they were going to call her back with new appt but she needs pain medication now.  Pt has had no new injuries and pain is unchanged from previous, do not feel that repeat imaging indicated at this time.  Pt states she has lost her knee immobilizer and crutches, will be given new ones and naproxen for pain.  At time of discharge, pt unhappy with choice of pain medications, requesting vicodin.  Earlier during ED visit, pt expressed that she was previously given vicodin but it did not work for her.  Pt has been given narcotic prescription for this pain in the ED prior to this and has a hx of drug overdose, polysubstance abuse as well as psychiatric history.  I am hesitant to prescribe narcotics today.  Social work has evaluated the pt and given her information to orthopedic office in the area that will accept her insurance to arrange close FU.  Pt and boyfriend remain angry, now refusing crutches and will not speak to staff.  Pt ambulated out of the ED without difficulty.  I personally performed the services described in this documentation, which was scribed in my presence. The recorded information has been reviewed and is accurate.  Larene Pickett, PA-C 09/05/13 1252

## 2013-09-05 NOTE — ED Notes (Signed)
Patient states tore ligaments in knee several months ago.   Patient states that she went to [facility].  Patient states she had an appt with them today, but because she has just gotten insurance, they have to run her insurance to check for coverage and were unable to see her today.  Patient states "they said something about a copay and insurance".  Patient states she needs pain medication now.

## 2013-09-05 NOTE — Discharge Instructions (Signed)
Take the prescribed medication as directed. Follow-up with [facility] as previously arranged. Return to the ED for new or worsening symptoms.

## 2013-09-06 ENCOUNTER — Emergency Department (HOSPITAL_COMMUNITY)
Admission: EM | Admit: 2013-09-06 | Discharge: 2013-09-06 | Disposition: A | Discharge status: Home / Self Care | Payer: No Typology Code available for payment source | Attending: Emergency Medicine | Admitting: Emergency Medicine

## 2013-09-06 ENCOUNTER — Encounter (HOSPITAL_COMMUNITY): Payer: Self-pay | Admitting: Emergency Medicine

## 2013-09-06 ENCOUNTER — Other Ambulatory Visit: Payer: Self-pay | Admitting: Orthopedic Surgery

## 2013-09-06 ENCOUNTER — Emergency Department (HOSPITAL_COMMUNITY): Payer: No Typology Code available for payment source

## 2013-09-06 DIAGNOSIS — M25469 Effusion, unspecified knee: Secondary | ICD-10-CM | POA: Insufficient documentation

## 2013-09-06 DIAGNOSIS — I10 Essential (primary) hypertension: Secondary | ICD-10-CM | POA: Insufficient documentation

## 2013-09-06 DIAGNOSIS — F3289 Other specified depressive episodes: Secondary | ICD-10-CM | POA: Insufficient documentation

## 2013-09-06 DIAGNOSIS — F411 Generalized anxiety disorder: Secondary | ICD-10-CM | POA: Insufficient documentation

## 2013-09-06 DIAGNOSIS — R609 Edema, unspecified: Secondary | ICD-10-CM

## 2013-09-06 DIAGNOSIS — M25569 Pain in unspecified knee: Secondary | ICD-10-CM | POA: Insufficient documentation

## 2013-09-06 DIAGNOSIS — R531 Weakness: Secondary | ICD-10-CM

## 2013-09-06 DIAGNOSIS — F329 Major depressive disorder, single episode, unspecified: Secondary | ICD-10-CM | POA: Insufficient documentation

## 2013-09-06 DIAGNOSIS — M25562 Pain in left knee: Secondary | ICD-10-CM

## 2013-09-06 DIAGNOSIS — Z8719 Personal history of other diseases of the digestive system: Secondary | ICD-10-CM | POA: Insufficient documentation

## 2013-09-06 DIAGNOSIS — F17200 Nicotine dependence, unspecified, uncomplicated: Secondary | ICD-10-CM | POA: Insufficient documentation

## 2013-09-06 DIAGNOSIS — Z791 Long term (current) use of non-steroidal anti-inflammatories (NSAID): Secondary | ICD-10-CM | POA: Insufficient documentation

## 2013-09-06 DIAGNOSIS — Z79899 Other long term (current) drug therapy: Secondary | ICD-10-CM | POA: Insufficient documentation

## 2013-09-06 MED ORDER — HYDROCODONE-ACETAMINOPHEN 5-325 MG PO TABS: 1.0000 | ORAL_TABLET | Freq: Four times a day (QID) | ORAL | Status: DC | PRN

## 2013-09-06 NOTE — ED Provider Notes (Signed)
CSN: [id number]     Arrival date & time 09/06/13  1048 History  This chart was scribed for non-physician practitioner, Jamse Mead, PA-C working with Dot Lanes, MD by Einar Pheasant, ED scribe. This patient was seen in room [employer] and the patient's care was started at 1:10 PM.     Chief Complaint  Patient presents with  . Knee Pain   The history is provided by the patient. No language interpreter was used.   HPI Comments: [name] is a 43 y.o. female who presents to the Emergency Department complaining of worsening left knee pain that started 3 months ago. Pt states that she was intoxicated and fell on the knee several months ago and was told that she may have tore some ligaments in the knee. Pt states that she had a scheduled appointment at [facility] the morning of 09/05/13, but the appointment was cancelled because her insurance did not go through. Thus, she decided to report of [facility]. She was seen at [facility] ED yesterday afternoon (09/05/13), where she was discharged with naproxen and vicodin for pain control. Her discharge instructions advised her to return to the ED if her symptoms worsened. Today, pt states that she is in a lot of pain. She describes the pain as sharp and stabbing. She states that when she walks it feels like her bones are rubbing together. Pt cannot tolerate the pain any more, she's requesting for something to be done to alleviate her worsening pain. Pt states that she is currently living at a women's shelter so the crutches limit the her movement, especially when they put her out at 7:30 every morning. She does have a compression sleeve on her left knee. She states that she does a lot of walking during the day, which exacerbates her pain. Pt is also complaining of associated left leg swelling and paraesthesia. Denies any recent injury,fever, cough, or SOB.  LNMP: PCP: Frederico Hamman, MD  Past Medical History  Diagnosis Date  .  Hepatitis   . Hypertension   . Anxiety   . Depression    History reviewed. No pertinent past surgical history. Family History  Problem Relation Age of Onset  . Depression Other    History  Substance Use Topics  . Smoking status: Current Every Day Smoker -- 0.50 packs/day    Types: Cigarettes  . Smokeless tobacco: Never Used  . Alcohol Use: No   OB History   Grav Para Term Preterm Abortions TAB SAB Ect Mult Living                 Review of Systems  Constitutional: Negative for fever and chills.  HENT: Negative for congestion.   Respiratory: Negative for cough and shortness of breath.   Musculoskeletal: Positive for arthralgias (left knee) and joint swelling.  All other systems reviewed and are negative.      Allergies  Review of patient's allergies indicates no known allergies.  Home Medications   Current Outpatient Rx  Name  Route  Sig  Dispense  Refill  . divalproex (DEPAKOTE ER) 500 MG 24 hr tablet   Oral   Take 500-1,000 mg by mouth 2 (two) times daily. Take one tablet every morning and 2 tablets at bedtime for mood stabilization.         . hydrOXYzine (VISTARIL) 25 MG capsule   Oral   Take 25 mg by mouth 2 (two) times daily as needed for anxiety.          [name]  naproxen (NAPROSYN) 500 MG tablet   Oral   Take 1 tablet (500 mg total) by mouth 2 (two) times daily with a meal.   30 tablet   0   . QUEtiapine (SEROQUEL) 400 MG tablet   Oral   Take 400 mg by mouth at bedtime.         [name] HYDROcodone-acetaminophen (NORCO/VICODIN) 5-325 MG per tablet   Oral   Take 1 tablet by mouth every 6 (six) hours as needed.   3 tablet   0    Triage Vitals:BP 109/72  Pulse 92  Temp(Src) 98.6 F (37 C) (Oral)  Resp 16  SpO2 99%  LMP 08/02/2013  Physical Exam  Nursing note and vitals reviewed. Constitutional: She is oriented to person, place, and time. She appears well-developed and well-nourished.  HENT:  Head: Normocephalic and atraumatic.  Eyes:  Conjunctivae and EOM are normal. Pupils are equal, round, and reactive to light. Right eye exhibits no discharge. Left eye exhibits no discharge.  Neck: Normal range of motion. Neck supple.  Cardiovascular: Normal rate, regular rhythm and normal heart sounds.  Exam reveals no friction rub.   No murmur heard. Pulses:      Radial pulses are 2+ on the right side, and 2+ on the left side.       Dorsalis pedis pulses are 2+ on the right side, and 2+ on the left side.  Pulmonary/Chest: Effort normal and breath sounds normal. No respiratory distress. She has no wheezes. She has no rales.  Abdominal: She exhibits no distension.  Musculoskeletal: She exhibits tenderness.       Legs: Mild swelling localized left knee. Negative erythema, inflammation, warmth upon palpation. Decreased range of motion to the left knee secondary to pain-decreased flexion secondary to pain. Pain with valgus and various tension. Discomfort upon palpation to anterior posterior aspect of circumferential discomfort. Full range of motion to the left hip without difficulty and digits without difficulty and ankle.  Neurological: She is alert and oriented to person, place, and time. No cranial nerve deficit. She exhibits normal muscle tone. Coordination normal.  Cranial nerves III-XII grossly intact Strength 5+/5+ to lower extremities bilaterally with resistance applied, equal distribution noted Sensation intact with differentiation to sharp and dull touch Negative arm drift Fine motor skills intact Heel to knee down shin normal bilaterally Gait proper, proper balance - negative sway, negative drift, negative step-offs  Skin: Skin is warm and dry.  Psychiatric: She has a normal mood and affect. Her behavior is normal. Thought content normal.  Patient appears irritated    ED Course  Procedures (including critical care time)  DIAGNOSTIC STUDIES: Oxygen Saturation is 99% on RA, normal by my interpretation.    COORDINATION OF  CARE: 2:40 PM- Pt advised of plan for treatment and pt agrees.    Dg Knee Complete 4 Views Left  09/06/2013   CLINICAL DATA:  Pain  EXAM: LEFT KNEE - COMPLETE 4+ VIEW  COMPARISON:  January 2 3, 2015  FINDINGS: Frontal, lateral, and bilateral oblique views were obtained. There is no fracture dislocation, or effusion. Joint spaces appear intact. No erosive change.  IMPRESSION: No abnormality noted.   Electronically Signed   By: Lowella Grip M.D.   On: 09/06/2013 13:47      Labs Review Labs Reviewed - No data to display Imaging Review Dg Knee Complete 4 Views Left  09/06/2013   CLINICAL DATA:  Pain  EXAM: LEFT KNEE - COMPLETE 4+ VIEW  COMPARISON:  August 16, 2013  FINDINGS: Frontal, lateral, and bilateral oblique views were obtained. There is no fracture dislocation, or effusion. Joint spaces appear intact. No erosive change.  IMPRESSION: No abnormality noted.   Electronically Signed   By: Lowella Grip M.D.   On: 09/06/2013 13:47    EKG Interpretation   None       MDM   Final diagnoses:  Left knee pain   Filed Vitals:   09/06/13 1141  BP: 109/72  Pulse: 92  Temp: 98.6 F (37 C)  TempSrc: Oral  Resp: 16  SpO2: 99%    I personally performed the services described in this documentation, which was scribed in my presence. The recorded information has been reviewed and is accurate.  Patient presenting to emergency department with left knee pain that has been ongoing for the past 3 months. Patient reported that while she was intoxicated she fell and landed on the left knee on the concrete. Reported that she had an appointment with Dr. Alphonzo Severance yesterday, orthopedic, reported that when she showed up to the appointment they were unable to take her secondary to her insurance. Stated that she was seen and assessed at the emergency Department [facility] yesterday where they did "nothing" for her. Patient reports she needs her Vicodin as a means to control her pain. Patient  requesting Percocets due to Vicodin not helping. Reported that she is due to get a phone call regarding her next appointment with Dr. Alphonzo Severance. This provider reviewed patient's chart. Patient was seen and assessed on 08/16/2013 where plain film of left knee showed negative abnormalities. Patient was seen and assessed in the ER yesterday where she was requesting pain medications-was not distributed-was discharged with naproxen. Patient was allegedly upset due to not getting pain meds. Alert and oriented. GCS 15. Heart rate and rhythm normal. Lungs clear to auscultation. Radial and DP pulses 2+ bilaterally. Mild swelling localized to left knee with negative erythema, inflammation, warmth upon palpation. Circumferential discomfort upon palpation. Decreased flexion to the left knee secondary to pain. Patient is able to stand and apply pressure to the left knee without difficulty. Strength intact with equal distribution. Sensation intact. Plain film of left knee negative for acute abnormalities-negative osseous findings. Patient neurovascularly intact. Suspicion to be chronic knee pain secondary to trauma event that occurred 3 months ago. Patient does have an orthopedic that she is following up, does not have appointment currently. Patient has a MRI of her left knee ordered-recommended patient to get this MRI performed. Patient stable, afebrile. Discharged patient. Discharge patient with small dose of pain medications-3 pills-discussed course, precautions, disposal technique. Referred patient to Dr. Nicki Reaper and primary care provider - reported that ED is not meant for pain medications distribution, this will only happen once and if she is to return again she will not get pain medications, she will have to go to her PCP or orthopedics. Discussed with patient rest, ice and elevate. Discussed with patient to avoid any physical or strenuous activity. Discussed the patient to keep knee sleeve on at all times. Discussed  with patient to avoid any physical or strenuous activity. Discussed with patient to closely monitor symptoms and if symptoms are to worsen or change to report back to the ED - strict return instructions given.  Patient agreed to plan of care, understood, all questions answered.    Jamse Mead, PA-C 09/07/13 [phone]

## 2013-09-06 NOTE — Discharge Instructions (Signed)
Please call your doctor for a followup appointment within 24-48 hours. When you talk to your doctor please let them know that you were seen in the emergency department and have them acquire all of your records so that they can discuss the findings with you and formulate a treatment plan to fully care for your new and ongoing problems. Please call and set-up an appointment with Orthopedics regarding continuous knee pain Highly recommend MRI to be performed-the order has been placed so please report back to [place] to get the imaging performed Please avoid any physical strenuous activity Please apply icy hot ointment and massage Please rest and stay hydrated Please keep knee sleeve on at all times Please report back to emergency department if the symptoms change or worsen (fever [address], chills, neck pain, neck stiffness, chest pain, shortness of breath, swelling, numbness, tingling, fall)  Knee Pain Knee pain can be a result of an injury or other medical conditions. Treatment will depend on the cause of your pain. HOME CARE  Only take medicine as told by your doctor.  Keep a healthy weight. Being overweight can make the knee hurt more.  Stretch before exercising or playing sports.  If there is constant knee pain, change the way you exercise. Ask your doctor for advice.  Make sure shoes fit well. Choose the right shoe for the sport or activity.  Protect your knees. Wear kneepads if needed.  Rest when you are tired. GET HELP RIGHT AWAY IF:   Your knee pain does not stop.  Your knee pain does not get better.  Your knee joint feels hot to the touch.  You have a fever. MAKE SURE YOU:   Understand these instructions.  Will watch this condition.  Will get help right away if you are not doing well or get worse. Document Released: 10/07/2008 Document Revised: 10/03/2011 Document Reviewed: 10/07/2008 [facility] Patient Information 2014 [place], [place].   Emergency Department  Resource Guide 1) Find a Doctor and Pay Out of Pocket Although you won't have to find out who is covered by your insurance plan, it is a good idea to ask around and get recommendations. You will then need to call the office and see if the doctor you have chosen will accept you as a new patient and what types of options they offer for patients who are self-pay. Some doctors offer discounts or will set up payment plans for their patients who do not have insurance, but you will need to ask so you aren't surprised when you get to your appointment.  2) Contact Your Local Health Department Not all health departments have doctors that can see patients for sick visits, but many do, so it is worth a call to see if yours does. If you don't know where your local health department is, you can check in your phone book. The CDC also has a tool to help you locate your state's health department, and many state websites also have listings of all of their local health departments.  3) Find a [facility] If your illness is not likely to be very severe or complicated, you may want to try a walk in clinic. These are popping up all over the country in pharmacies, drugstores, and shopping centers. They're usually staffed by nurse practitioners or physician assistants that have been trained to treat common illnesses and complaints. They're usually fairly quick and inexpensive. However, if you have serious medical issues or chronic medical problems, these are probably not your  best option.  No Primary Care Doctor: - Call Health Connect at  [phone] - they can help you locate a primary care doctor that  accepts your insurance, provides certain services, etc. - Physician Referral Service- [phone]  Chronic Pain Problems: Organization         Address  Phone   Notes  [facility]  [phone] Patients need to be referred by their primary care doctor.   Medication Assistance: Organization          Address  Phone   Notes  [facility] Medication [facility] [place] [place], [place] [address] [phone] --Must be a resident of [facility] -- Must have NO insurance coverage whatsoever (no Medicaid/ Medicare, etc.) -- The pt. MUST have a primary care doctor that directs their care regularly and follows them in the community   MedAssist  [phone]   [employer]  [phone]    Agencies that provide inexpensive medical care: Organization         Address  Phone   Notes  [place]  [phone]   [facility]    [phone]   [facility] [place], [place] [address] [phone]   [place] [address] [place] [phone]   Planned Parenthood    [phone]   [facility]    [phone]   [place] and [place] [address], [place] Phone:  [phone], Fax:  [phone] Hours of Operation:  9 am - 6 pm, M-F.  Also accepts Medicaid/Medicare and self-pay.  [facility] for [place], [address], [place] Phone: [phone], Fax: [phone]. Hours of Operation:  8:30 am - 5:30 pm, M-F.  Also accepts Medicaid and self-pay.  [facility] [place] [address], [place] Phone: [phone]   [place], [place], [place] [phone], Ext. [phone] Mondays & Thursdays: 7-9 AM.  First 15 patients are seen on a first come, first serve basis.    [place] Providers:  Organization         Address  Phone   Notes  [facility] [address], [place] [phone] Also accepts self-pay patients.  [facility] [address], [place]  [phone]   [place], [address], [place] [phone]   [facility] [address], [place] [phone]   Lucianne Lei [address], [place]   [phone] Only accepts [place] Access [place] patients after they have their name applied to their card.   Self-Pay (no insurance) in [facility]:  Organization         Address  Phone   Notes  Sickle Cell Patients, [facility] Internal Medicine [place] [phone]   [facility] [place] [phone]   [facility] [place]  [place], [place], [place] [phone]   [facility]/Dr. Osei-Bonsu  [address] [place] or [address], [place] [phone] Phone number for both [place] and [place] locations is the same.  Urgent Medical and [facility] [address],  [place] [phone]   [facility] [address] [place] or [address] [phone]   [facility] [address], [place] [phone], phone; [phone], fax Sees patients 1st and 3rd Saturday of every month.  Must not qualify for public or private insurance (i.e. Medicaid, Medicare, [place] Health Choice, Veterans' Benefits)  Household income should be no more than 200% of the poverty level The clinic cannot treat you if you are pregnant or think you are pregnant  Sexually transmitted diseases are not treated at the clinic.    Dental Care: Organization         Address  Phone  Notes  [facility] Department of [facility] [place] [phone] Accepts children up to age 28 who are enrolled in [place] or [place]; pregnant women with a Medicaid card; and children who have applied for Medicaid or [place] Health Choice, but were declined, whose parents can pay a reduced fee at time of service.  [facility] Department of [facility]  [address], [place] [phone] Accepts children up to age 21 who are enrolled in [place] or [place]; pregnant women with a Medicaid card; and children who have applied for Medicaid or [place] Health Choice, but were declined, whose parents can pay a reduced fee at time of service.  [facility] Adult [facility] Access PROGRAM  [place] [phone] Patients are seen by appointment only. Walk-ins are not accepted. [place] will see patients 33 years of age and older. Monday - Tuesday (8am-5pm) Most Wednesdays (8:30-5pm) $30 per visit, cash only  [facility] Adult Dental Access PROGRAM  [address], [facility] [phone] Patients are seen by appointment only. Walk-ins are not accepted. [place] will see patients 45 years of age and older. One Wednesday Evening (Monthly: Volunteer Based).  $30 per visit, cash only  [place]  [phone] for adults; Children under age 41, call Graduate Pediatric Dentistry at [phone]. Children aged 90-14, please call [phone] to request a pediatric application.  Dental services are provided in all areas of dental care including fillings, crowns and bridges, complete and partial dentures, implants, gum treatment, root canals, and extractions. Preventive care is also provided. Treatment is provided to both adults and children. Patients are selected via a lottery and there is often a waiting list.   [facility] [address], [place]  [phone] [email or web address]   [facility] [address] [place], [place] [phone], Ext. [phone] Second and Fourth Thursday of each month, opens at 6:30 AM; Clinic ends at 9 AM.  Patients are seen on a first-come first-served basis, and a limited number are seen during each clinic.   [facility]  [address] Hillard Danker [employer], [place] [phone]   Eligibility Requirements You must  have lived in [place], [place], or [place] counties for at least the last three months.   You cannot be eligible for state or federal sponsored [employer], including [employer], [place], or [employer].   You generally cannot be eligible for healthcare insurance through your employer.    How to apply: Eligibility screenings are held every Tuesday and Wednesday afternoon from 1:00 pm until 4:00 pm. You do not need an appointment for the interview!  [facility] [address],  [place], [place]   [place]  [place]  [place]  [phone]    Behavioral Health Resources in the Community: Intensive Outpatient Programs Organization         Address  Phone  Notes  Connell [place]. [address], [place], [place] [phone]   [facility] Outpatient [address], [place], [place]   ADS: Alcohol & Drug Svcs [address], [place], [place]   [place] [address]  [place], [place] or [phone]   Substance Abuse Resources Organization         Address  Phone  Notes  Alcohol and Drug Services  [phone]   [place]  [phone]   [facility] [place]   Chinita Pester  [phone]   Residential & Outpatient Substance Abuse Program  [phone]   Psychological Services Organization         Address  Phone  Notes  [facility] [place]  [place]  [phone]   [place] [address] [place] or [phone]    Mobile Crisis Teams Organization         Address  Phone  Notes  [facility], Mobile Crisis Care Unit  [phone]   [facility]otherapeutic [facility]  [address] [place], [place]   [place] [address] [place] [phone]    Self-Help/Support Groups Organization         Address  Phone             Notes  [place]. of [place] - variety of support groups  [place] Call for more information  Narcotics Anonymous (NA), [facility] [address], [employer] [place]  2 meetings at this location   Special educational needs teacher         Address  Phone  Notes  [facility] Residential Treatment [place],    [place]  [phone]   [facility]  [address] [place] [phone], [place], [place]   [place], [place] [phone] Admissions: 8am-3pm M-F  Incentives Substance [place] [address]    [place], [place] [phone]   [facility] [address] [place], [place], [place]   [facility] [address],  [place], [place]   [facility] - Intensive Outpatient [address] Kristeen [address], [place], [place]   [facility] ([place].,  [place], [place] [phone] or [phone]   [facility]idential Treatment Services (RTS) [address] [place], [place] Accepts Medicaid  [facility] [place] [address]  [place] [phone] Substance Abuse/Addiction Treatment   [facility] Organization         Address  Phone  Notes  [facility]  [phone]   Domenic Schwab, PhD [address] Arlis [place], [place]   [phone] or [phone]   [place], [place] [phone]   [place] [address], [place], [place] [phone] Insurance/Medicaid/sponsorship through [employer] and Families [address], [id number]  [place], [place] [phone] [place], [place] [phone]    Dr. Adele Schilder  [phone]   [facility] Dept. 1) [address] [place] 2) [place] 3)  [address], [place] [phone]  [phone]   [place] [phone] or [phone] (After Hours)

## 2013-09-06 NOTE — ED Notes (Signed)
Per pt, pain to left knee for two weeks.  Pt states she has been seen multiple times for the pain.  Pt was to have a MRI and it was not done because of insurance difficulty.  Pt states we "need to giver her something for the pain"

## 2013-09-09 NOTE — ED Provider Notes (Signed)
Medical screening examination/treatment/procedure(s) were performed by non-physician practitioner and as supervising physician I was immediately available for consultation/collaboration.  EKG Interpretation   None        Virgel Manifold, MD 09/09/13 1415

## 2013-09-12 ENCOUNTER — Ambulatory Visit
Admission: RE | Admit: 2013-09-12 | Discharge: 2013-09-12 | Disposition: A | Discharge status: Home / Self Care | Payer: No Typology Code available for payment source | Source: Ambulatory Visit | Attending: Orthopedic Surgery | Admitting: Orthopedic Surgery

## 2013-09-12 DIAGNOSIS — M25562 Pain in left knee: Secondary | ICD-10-CM

## 2013-09-12 DIAGNOSIS — R609 Edema, unspecified: Secondary | ICD-10-CM

## 2013-09-12 DIAGNOSIS — R531 Weakness: Secondary | ICD-10-CM

## 2013-09-17 NOTE — ED Provider Notes (Signed)
Medical screening examination/treatment/procedure(s) were performed by non-physician practitioner and as supervising physician I was immediately available for consultation/collaboration.    Adyn Hoes L Verlene Glantz, MD 09/17/13 1601 

## 2013-10-07 ENCOUNTER — Encounter: Payer: Self-pay | Admitting: Obstetrics & Gynecology

## 2013-10-09 ENCOUNTER — Emergency Department (HOSPITAL_COMMUNITY)
Admission: EM | Admit: 2013-10-09 | Discharge: 2013-10-09 | Disposition: A | Discharge status: Home / Self Care | Payer: No Typology Code available for payment source | Attending: Emergency Medicine | Admitting: Emergency Medicine

## 2013-10-09 ENCOUNTER — Encounter (HOSPITAL_COMMUNITY): Payer: Self-pay | Admitting: Emergency Medicine

## 2013-10-09 ENCOUNTER — Emergency Department (HOSPITAL_COMMUNITY): Payer: No Typology Code available for payment source

## 2013-10-09 DIAGNOSIS — F411 Generalized anxiety disorder: Secondary | ICD-10-CM | POA: Insufficient documentation

## 2013-10-09 DIAGNOSIS — Z791 Long term (current) use of non-steroidal anti-inflammatories (NSAID): Secondary | ICD-10-CM | POA: Insufficient documentation

## 2013-10-09 DIAGNOSIS — F329 Major depressive disorder, single episode, unspecified: Secondary | ICD-10-CM | POA: Insufficient documentation

## 2013-10-09 DIAGNOSIS — R1031 Right lower quadrant pain: Secondary | ICD-10-CM | POA: Insufficient documentation

## 2013-10-09 DIAGNOSIS — K5900 Constipation, unspecified: Secondary | ICD-10-CM | POA: Insufficient documentation

## 2013-10-09 DIAGNOSIS — F3289 Other specified depressive episodes: Secondary | ICD-10-CM | POA: Insufficient documentation

## 2013-10-09 DIAGNOSIS — Z3202 Encounter for pregnancy test, result negative: Secondary | ICD-10-CM | POA: Insufficient documentation

## 2013-10-09 DIAGNOSIS — R109 Unspecified abdominal pain: Secondary | ICD-10-CM

## 2013-10-09 DIAGNOSIS — R1032 Left lower quadrant pain: Secondary | ICD-10-CM | POA: Insufficient documentation

## 2013-10-09 DIAGNOSIS — Z79899 Other long term (current) drug therapy: Secondary | ICD-10-CM | POA: Insufficient documentation

## 2013-10-09 DIAGNOSIS — I10 Essential (primary) hypertension: Secondary | ICD-10-CM | POA: Insufficient documentation

## 2013-10-09 DIAGNOSIS — F17200 Nicotine dependence, unspecified, uncomplicated: Secondary | ICD-10-CM | POA: Insufficient documentation

## 2013-10-09 LAB — COMPREHENSIVE METABOLIC PANEL
ALBUMIN: 4 g/dL (ref 3.5–5.2)
ALT: 44 U/L — ABNORMAL HIGH (ref 0–35)
AST: 33 U/L (ref 0–37)
Alkaline Phosphatase: 56 U/L (ref 39–117)
BUN: 14 mg/dL (ref 6–23)
CALCIUM: 9.1 mg/dL (ref 8.4–10.5)
CO2: 26 mEq/L (ref 19–32)
CREATININE: 0.71 mg/dL (ref 0.50–1.10)
Chloride: 103 mEq/L (ref 96–112)
GFR calc Af Amer: >90 mL/min (ref >90)
GFR calc non Af Amer: >90 mL/min (ref >90)
Glucose, Bld: 78 mg/dL (ref 70–99)
Potassium: 3.9 mEq/L (ref 3.7–5.3)
Sodium: 140 mEq/L (ref 137–147)
Total Bilirubin: 0.2 mg/dL — ABNORMAL LOW (ref 0.3–1.2)
Total Protein: 7.6 g/dL (ref 6.0–8.3)

## 2013-10-09 LAB — URINE MICROSCOPIC-ADD ON

## 2013-10-09 LAB — CBC WITH DIFFERENTIAL/PLATELET
BASOS ABS: 0 10*3/uL (ref 0.0–0.1)
BASOS PCT: 0 % (ref 0–1)
Eosinophils Absolute: 0.2 10*3/uL (ref 0.0–0.7)
Eosinophils Relative: 3 % (ref 0–5)
HCT: 39 % (ref 36.0–46.0)
Hemoglobin: 13.7 g/dL (ref 12.0–15.0)
Lymphocytes Relative: 31 % (ref 12–46)
Lymphs Abs: 2.3 10*3/uL (ref 0.7–4.0)
MCH: 36.1 pg — ABNORMAL HIGH (ref 26.0–34.0)
MCHC: 35.1 g/dL (ref 30.0–36.0)
MCV: 102.6 fL — ABNORMAL HIGH (ref 78.0–100.0)
MONO ABS: 0.5 10*3/uL (ref 0.1–1.0)
Monocytes Relative: 7 % (ref 3–12)
Neutro Abs: 4.5 10*3/uL (ref 1.7–7.7)
Neutrophils Relative %: 59 % (ref 43–77)
Platelets: 168 10*3/uL (ref 150–400)
RBC: 3.8 MIL/uL — ABNORMAL LOW (ref 3.87–5.11)
RDW: 12.9 % (ref 11.5–15.5)
WBC: 7.6 10*3/uL (ref 4.0–10.5)

## 2013-10-09 LAB — URINALYSIS, ROUTINE W REFLEX MICROSCOPIC
Bilirubin Urine: NEGATIVE
GLUCOSE, UA: NEGATIVE mg/dL
HGB URINE DIPSTICK: NEGATIVE
Ketones, ur: NEGATIVE mg/dL
Nitrite: NEGATIVE
PH: 6 (ref 5.0–8.0)
Protein, ur: NEGATIVE mg/dL
Specific Gravity, Urine: 1.034 — ABNORMAL HIGH (ref 1.005–1.030)
Urobilinogen, UA: 1 mg/dL (ref 0.0–1.0)

## 2013-10-09 LAB — PREGNANCY, URINE: PREG TEST UR: NEGATIVE

## 2013-10-09 MED ORDER — FLEET ENEMA 7-19 GM/118ML RE ENEM
1.0000 | ENEMA | Freq: Once | RECTAL | Status: AC
  Administered 2013-10-09: 1 via RECTAL
  Filled 2013-10-09: qty 1

## 2013-10-09 MED ORDER — POLYETHYLENE GLYCOL 3350 17 G PO PACK: 17.0000 g | PACK | Freq: Two times a day (BID) | ORAL | Status: DC

## 2013-10-09 NOTE — ED Provider Notes (Signed)
CSN: [id number]     Arrival date & time 10/09/13  1116 History   First MD Initiated Contact with Patient 10/09/13 1127     Chief Complaint  Patient presents with  . Abdominal Pain  . Constipation     (Consider location/radiation/quality/duration/timing/severity/associated sxs/prior Treatment) HPI Comments: 43 year old female presents with constipation for one month. States her last normal bowel movement was one month ago and about twice a week she'll have a hard stool balls. He states her last couple days she's developed cramping abdominal pain like she is about to have her period. Denies any current vaginal bleeding or dysuria. No nausea or vomiting. She has only tried ginger ale for her constipation. She's worried that her psychiatric medicines, Depakote, and Vistaril, and Seroquel, are causing her to be constipated.   Past Medical History  Diagnosis Date  . Hepatitis   . Hypertension   . Anxiety   . Depression    No past surgical history on file. Family History  Problem Relation Age of Onset  . Depression Other    History  Substance Use Topics  . Smoking status: Current Every Day Smoker -- 0.50 packs/day    Types: Cigarettes  . Smokeless tobacco: Never Used  . Alcohol Use: No   OB History   Grav Para Term Preterm Abortions TAB SAB Ect Mult Living                 Review of Systems  Gastrointestinal: Positive for abdominal pain, constipation and abdominal distention. Negative for nausea and vomiting.  Genitourinary: Negative for dysuria and vaginal bleeding.  Musculoskeletal: Negative for back pain.  All other systems reviewed and are negative.      Allergies  Review of patient's allergies indicates no known allergies.  Home Medications   Current Outpatient Rx  Name  Route  Sig  Dispense  Refill  . divalproex (DEPAKOTE ER) 500 MG 24 hr tablet   Oral   Take 500-1,000 mg by mouth 2 (two) times daily. Take one tablet every morning and 2 tablets at bedtime for  mood stabilization.         [name] HYDROcodone-acetaminophen (NORCO/VICODIN) 5-325 MG per tablet   Oral   Take 1 tablet by mouth every 6 (six) hours as needed.   3 tablet   0   . hydrOXYzine (VISTARIL) 25 MG capsule   Oral   Take 25 mg by mouth 2 (two) times daily as needed for anxiety.          . naproxen (NAPROSYN) 500 MG tablet   Oral   Take 1 tablet (500 mg total) by mouth 2 (two) times daily with a meal.   30 tablet   0   . QUEtiapine (SEROQUEL) 400 MG tablet   Oral   Take 400 mg by mouth at bedtime.          BP 126/77  Pulse 100  Temp(Src) 98.7 F (37.1 C) (Oral)  Resp 16  SpO2 100%  LMP 08/29/2013 Physical Exam  Nursing note and vitals reviewed. Constitutional: She is oriented to person, place, and time. She appears well-developed and well-nourished.  HENT:  Head: Normocephalic and atraumatic.  Right Ear: External ear normal.  Left Ear: External ear normal.  Nose: Nose normal.  Eyes: Right eye exhibits no discharge. Left eye exhibits no discharge.  Cardiovascular: Normal rate, regular rhythm and normal heart sounds.   Pulmonary/Chest: Effort normal and breath sounds normal.  Abdominal: Soft. She exhibits distension. There is tenderness in  the right lower quadrant, suprapubic area and left lower quadrant. There is no rigidity and no guarding.  Genitourinary: Rectal exam shows no external hemorrhoid, no internal hemorrhoid and no mass.  Neurological: She is alert and oriented to person, place, and time.  Skin: Skin is warm and dry.    ED Course  Fecal disimpaction Date/Time: 10/09/2013 11:58 AM Performed by: Sherwood Gambler T Authorized by: Sherwood Gambler T Consent: Verbal consent obtained. Risks and benefits: risks, benefits and alternatives were discussed Preparation: Patient was prepped and draped in the usual sterile fashion. Local anesthesia used: no Patient sedated: no Patient tolerance: Patient tolerated the procedure well with no immediate  complications. Comments: One soft piece of stool removed manually, unable to get any further stool. No signs of a fecal impaction   (including critical care time) Labs Review Labs Reviewed  CBC WITH DIFFERENTIAL - Abnormal; Notable for the following:    RBC 3.80 (*)    MCV 102.6 (*)    MCH 36.1 (*)    All other components within normal limits  COMPREHENSIVE METABOLIC PANEL - Abnormal; Notable for the following:    ALT 44 (*)    Total Bilirubin 0.2 (*)    All other components within normal limits  URINALYSIS, ROUTINE W REFLEX MICROSCOPIC - Abnormal; Notable for the following:    APPearance CLOUDY (*)    Specific Gravity, Urine 1.034 (*)    Leukocytes, UA SMALL (*)    All other components within normal limits  URINE MICROSCOPIC-ADD ON - Abnormal; Notable for the following:    Squamous Epithelial / LPF MANY (*)    Bacteria, UA MANY (*)    All other components within normal limits  PREGNANCY, URINE  POC URINE PREG, ED   Imaging Review Dg Abd Acute W/chest  10/09/2013   CLINICAL DATA:  Constipation.  Hypertension.  EXAM: ACUTE ABDOMEN SERIES (ABDOMEN 2 VIEW & CHEST 1 VIEW)  COMPARISON:  US PELVIS COMPLETE dated 08/29/2013; DG CHEST 2V dated 11/22/2010; CT ANGIO CHEST W/CM &/OR WO/CM dated 07/09/2007  FINDINGS: The lungs appear clear.  Cardiac and mediastinal contours normal.  No pleural effusion identified.  No free intraperitoneal gas beneath the hemidiaphragms.  Gas and stool noted in the colon. Borderline prominence of proximal colonic stool. No dilated bowel observed. Scattered vascular calcifications are present in the anatomic pelvis.  IMPRESSION: 1. Borderline prominence of proximal colonic stool may reflect the patient's constipation. Otherwise negative.   Electronically Signed   By: Sherryl Barters M.D.   On: 10/09/2013 12:46     EKG Interpretation None      MDM   Final diagnoses:  Abdominal pain  Constipation    I feel her lower abdominal pain is coming from  constipation given her history. Has soft stool on rectal exam, small amount removed. Will give enema for symptom relief. AAS benign besides constipation. No significant electrolyte abnormality. I have low suspicion for SBO (no vomiting), appendicitis, colitis, GU pathology or ureteral stone. She is otherwise well appearing and appears comfortable. Will discharge with laxatives, stool softeners, and fiber and refer back to PCP.    Ephraim Hamburger, MD 10/09/13 [phone]

## 2013-10-09 NOTE — ED Notes (Signed)
Pt c/o low abd pain.  States she was here before and dx w/ fibroids.  Now c/o constipation which she believes is due to taking depakote.  Has been almost a month since she had a normal BM.  Last BM was a week ago.

## 2013-10-09 NOTE — ED Notes (Signed)
Patient reports mostly liquid result with enema.

## 2013-10-09 NOTE — Discharge Instructions (Signed)
Abdominal Pain, Women °Abdominal (stomach, pelvic, or belly) pain can be caused by many things. It is important to tell your doctor: °· The location of the pain. °· Does it come and go or is it present all the time? °· Are there things that start the pain (eating certain foods, exercise)? °· Are there other symptoms associated with the pain (fever, nausea, vomiting, diarrhea)? °All of this is helpful to know when trying to find the cause of the pain. °CAUSES  °· Stomach: virus or bacteria infection, or ulcer. °· Intestine: appendicitis (inflamed appendix), regional ileitis (Crohn's disease), ulcerative colitis (inflamed colon), irritable bowel syndrome, diverticulitis (inflamed diverticulum of the colon), or cancer of the stomach or intestine. °· Gallbladder disease or stones in the gallbladder. °· Kidney disease, kidney stones, or infection. °· Pancreas infection or cancer. °· Fibromyalgia (pain disorder). °· Diseases of the female organs: °· Uterus: fibroid (non-cancerous) tumors or infection. °· Fallopian tubes: infection or tubal pregnancy. °· Ovary: cysts or tumors. °· Pelvic adhesions (scar tissue). °· Endometriosis (uterus lining tissue growing in the pelvis and on the pelvic organs). °· Pelvic congestion syndrome (female organs filling up with blood just before the menstrual period). °· Pain with the menstrual period. °· Pain with ovulation (producing an egg). °· Pain with an IUD (intrauterine device, birth control) in the uterus. °· Cancer of the female organs. °· Functional pain (pain not caused by a disease, may improve without treatment). °· Psychological pain. °· Depression. °DIAGNOSIS  °Your doctor will decide the seriousness of your pain by doing an examination. °· Blood tests. °· X-rays. °· Ultrasound. °· CT scan (computed tomography, special type of X-ray). °· MRI (magnetic resonance imaging). °· Cultures, for infection. °· Barium enema (dye inserted in the large intestine, to better view it with  X-rays). °· Colonoscopy (looking in intestine with a lighted tube). °· Laparoscopy (minor surgery, looking in abdomen with a lighted tube). °· Major abdominal exploratory surgery (looking in abdomen with a large incision). °TREATMENT  °The treatment will depend on the cause of the pain.  °· Many cases can be observed and treated at home. °· Over-the-counter medicines recommended by your caregiver. °· Prescription medicine. °· Antibiotics, for infection. °· Birth control pills, for painful periods or for ovulation pain. °· Hormone treatment, for endometriosis. °· Nerve blocking injections. °· Physical therapy. °· Antidepressants. °· Counseling with a psychologist or psychiatrist. °· Minor or major surgery. °HOME CARE INSTRUCTIONS  °· Do not take laxatives, unless directed by your caregiver. °· Take over-the-counter pain medicine only if ordered by your caregiver. Do not take aspirin because it can cause an upset stomach or bleeding. °· Try a clear liquid diet (broth or water) as ordered by your caregiver. Slowly move to a bland diet, as tolerated, if the pain is related to the stomach or intestine. °· Have a thermometer and take your temperature several times a day, and record it. °· Bed rest and sleep, if it helps the pain. °· Avoid sexual intercourse, if it causes pain. °· Avoid stressful situations. °· Keep your follow-up appointments and tests, as your caregiver orders. °· If the pain does not go away with medicine or surgery, you may try: °· Acupuncture. °· Relaxation exercises (yoga, meditation). °· Group therapy. °· Counseling. °SEEK MEDICAL CARE IF:  °· You notice certain foods cause stomach pain. °· Your home care treatment is not helping your pain. °· You need stronger pain medicine. °· You want your IUD removed. °· You feel faint or   lightheaded. °· You develop nausea and vomiting. °· You develop a rash. °· You are having side effects or an allergy to your medicine. °SEEK IMMEDIATE MEDICAL CARE IF:  °· Your  pain does not go away or gets worse. °· You have a fever. °· Your pain is felt only in portions of the abdomen. The right side could possibly be appendicitis. The left lower portion of the abdomen could be colitis or diverticulitis. °· You are passing blood in your stools (bright red or black tarry stools, with or without vomiting). °· You have blood in your urine. °· You develop chills, with or without a fever. °· You pass out. °MAKE SURE YOU:  °· Understand these instructions. °· Will watch your condition. °· Will get help right away if you are not doing well or get worse. °Document Released: 05/08/2007 Document Revised: 10/03/2011 Document Reviewed: 05/28/2009 °[employer]® Patient Information ©2014 [employer]. ° °

## 2014-03-09 ENCOUNTER — Inpatient Hospital Stay (HOSPITAL_COMMUNITY)
Admission: AD | Admit: 2014-03-09 | Discharge: 2014-03-10 | Disposition: A | Discharge status: Home / Self Care | Payer: No Typology Code available for payment source | Source: Ambulatory Visit | Attending: Obstetrics & Gynecology | Admitting: Obstetrics & Gynecology

## 2014-03-09 DIAGNOSIS — R109 Unspecified abdominal pain: Secondary | ICD-10-CM | POA: Diagnosis present

## 2014-03-09 DIAGNOSIS — T148XXA Other injury of unspecified body region, initial encounter: Secondary | ICD-10-CM

## 2014-03-09 DIAGNOSIS — F17200 Nicotine dependence, unspecified, uncomplicated: Secondary | ICD-10-CM | POA: Diagnosis not present

## 2014-03-09 DIAGNOSIS — R1031 Right lower quadrant pain: Secondary | ICD-10-CM | POA: Diagnosis not present

## 2014-03-09 LAB — POCT PREGNANCY, URINE: Preg Test, Ur: NEGATIVE

## 2014-03-10 ENCOUNTER — Encounter (HOSPITAL_COMMUNITY): Payer: Self-pay

## 2014-03-10 DIAGNOSIS — R1031 Right lower quadrant pain: Secondary | ICD-10-CM

## 2014-03-10 LAB — URINALYSIS, ROUTINE W REFLEX MICROSCOPIC
BILIRUBIN URINE: NEGATIVE
Glucose, UA: NEGATIVE mg/dL
Ketones, ur: NEGATIVE mg/dL
Leukocytes, UA: NEGATIVE
Nitrite: NEGATIVE
Protein, ur: NEGATIVE mg/dL
Specific Gravity, Urine: <1.005 — ABNORMAL LOW (ref 1.005–1.030)
UROBILINOGEN UA: 1 mg/dL (ref 0.0–1.0)
pH: 6 (ref 5.0–8.0)

## 2014-03-10 LAB — CBC
HCT: 41 % (ref 36.0–46.0)
Hemoglobin: 14.3 g/dL (ref 12.0–15.0)
MCH: 35.6 pg — AB (ref 26.0–34.0)
MCHC: 34.9 g/dL (ref 30.0–36.0)
MCV: 102 fL — ABNORMAL HIGH (ref 78.0–100.0)
PLATELETS: 129 10*3/uL — AB (ref 150–400)
RBC: 4.02 MIL/uL (ref 3.87–5.11)
RDW: 12.5 % (ref 11.5–15.5)
WBC: 5 10*3/uL (ref 4.0–10.5)

## 2014-03-10 LAB — URINE MICROSCOPIC-ADD ON

## 2014-03-10 LAB — WET PREP, GENITAL
Trich, Wet Prep: NONE SEEN
YEAST WET PREP: NONE SEEN

## 2014-03-10 MED ORDER — KETOROLAC TROMETHAMINE 60 MG/2ML IM SOLN
60.0000 mg | Freq: Once | INTRAMUSCULAR | Status: AC
  Administered 2014-03-10: 60 mg via INTRAMUSCULAR
  Filled 2014-03-10: qty 2

## 2014-03-10 NOTE — MAU Provider Note (Signed)
History     CSN: [id number]  Arrival date and time: 03/09/14 2341   First Provider Initiated Contact with Patient 03/09/14 2352      Chief Complaint  Patient presents with  . Abdominal Pain  . Vaginal Discharge  . Possible Pregnancy   Abdominal Pain  Vaginal Discharge The patient's primary symptoms include a vaginal discharge. Associated symptoms include abdominal pain.  Possible Pregnancy Associated symptoms include abdominal pain.    [name] is a 43 y.o. who presents today with RLQ pain that extends into her groin. She states that the pain started on Monday after she was doing cartwheels with her daughter. She took ibuprofen at 1400 today, and it did not help. She denies any vaginal bleeding. She does have a vaginal discharge, and is worried that her partner may have cheated on her. She wants STD testing today.   Past Medical History  Diagnosis Date  . Hepatitis   . Hypertension   . Anxiety   . Depression     No past surgical history on file.  Family History  Problem Relation Age of Onset  . Depression Other     History  Substance Use Topics  . Smoking status: Current Every Day Smoker -- 0.50 packs/day    Types: Cigarettes  . Smokeless tobacco: Never Used  . Alcohol Use: No    Allergies: No Known Allergies  Prescriptions prior to admission  Medication Sig Dispense Refill  . divalproex (DEPAKOTE ER) 500 MG 24 hr tablet Take 500-1,000 mg by mouth 2 (two) times daily. Take one tablet every morning and 2 tablets at bedtime for mood stabilization.      [name] HYDROcodone-acetaminophen (NORCO/VICODIN) 5-325 MG per tablet Take 1 tablet by mouth every 6 (six) hours as needed.  3 tablet  0  . hydrOXYzine (VISTARIL) 25 MG capsule Take 25 mg by mouth 2 (two) times daily as needed for anxiety.       . naproxen (NAPROSYN) 500 MG tablet Take 1 tablet (500 mg total) by mouth 2 (two) times daily with a meal.  30 tablet  0  . polyethylene glycol (MIRALAX / GLYCOLAX) packet  Take 17 g by mouth 2 (two) times daily.  14 each  0  . QUEtiapine (SEROQUEL) 400 MG tablet Take 400 mg by mouth at bedtime.        Review of Systems  Gastrointestinal: Positive for abdominal pain.  Genitourinary: Positive for vaginal discharge.   Physical Exam   Blood pressure 121/94, pulse 96, temperature 97.6 F (36.4 C), temperature source Oral, resp. rate 18.  Physical Exam  Nursing note and vitals reviewed. Constitutional: She is oriented to person, place, and time. She appears well-developed and well-nourished. No distress.  Cardiovascular: Normal rate.   Respiratory: Effort normal.  GI: Soft. There is no tenderness. There is no rebound.  Genitourinary:   External: no lesion Vagina: small amount of white discharge Cervix: pink, smooth, no CMT Uterus: NSSC Adnexa: NT   Musculoskeletal:  Iliopsoas muscle on the right is very tight and in a spasm.   Neurological: She is alert and oriented to person, place, and time.  Skin: Skin is warm and dry.  Psychiatric: She has a normal mood and affect.    MAU Course  Procedures Results for orders placed during the hospital encounter of 03/09/14 (from the past 24 hour(s))  URINALYSIS, ROUTINE W REFLEX MICROSCOPIC     Status: Abnormal   Collection Time    03/09/14 11:45 PM  Result Value Ref Range   Color, Urine YELLOW  YELLOW   APPearance CLEAR  CLEAR   Specific Gravity, Urine <1.005 (*) 1.005 - 1.030   pH 6.0  5.0 - 8.0   Glucose, UA NEGATIVE  NEGATIVE mg/dL   Hgb urine dipstick TRACE (*) NEGATIVE   Bilirubin Urine NEGATIVE  NEGATIVE   Ketones, ur NEGATIVE  NEGATIVE mg/dL   Protein, ur NEGATIVE  NEGATIVE mg/dL   Urobilinogen, UA 1.0  0.0 - 1.0 mg/dL   Nitrite NEGATIVE  NEGATIVE   Leukocytes, UA NEGATIVE  NEGATIVE  URINE MICROSCOPIC-ADD ON     Status: Abnormal   Collection Time    03/09/14 11:45 PM      Result Value Ref Range   Squamous Epithelial / LPF MANY (*) RARE   WBC, UA 0-2  <3 WBC/hpf   RBC / HPF 0-2  <3  RBC/hpf   Bacteria, UA RARE  RARE  POCT PREGNANCY, URINE     Status: None   Collection Time    03/09/14 11:48 PM      Result Value Ref Range   Preg Test, Ur NEGATIVE  NEGATIVE  CBC     Status: Abnormal   Collection Time    03/10/14 12:09 AM      Result Value Ref Range   WBC 5.0  4.0 - 10.5 K/uL   RBC 4.02  3.87 - 5.11 MIL/uL   Hemoglobin 14.3  12.0 - 15.0 g/dL   HCT 41.0  36.0 - 46.0 %   MCV 102.0 (*) 78.0 - 100.0 fL   MCH 35.6 (*) 26.0 - 34.0 pg   MCHC 34.9  30.0 - 36.0 g/dL   RDW 12.5  11.5 - 15.5 %   Platelets 129 (*) 150 - 400 K/uL  WET PREP, GENITAL     Status: Abnormal   Collection Time    03/10/14 12:13 AM      Result Value Ref Range   Yeast Wet Prep HPF POC NONE SEEN  NONE SEEN   Trich, Wet Prep NONE SEEN  NONE SEEN   Clue Cells Wet Prep HPF POC FEW (*) NONE SEEN   WBC, Wet Prep HPF POC FEW (*) NONE SEEN   0051: Patient reports that pain is better when being still. She still has pain when she is moving. Advised: rest, ice/heat.   Assessment and Plan   1. Pulled muscle    Comfort measures reviewed Return to MAU as needed Follow-up Information   Follow up with [facility] [place]. (As needed)    Contact information:   [place] [address] [phone]       Mathis Bud 03/10/2014, 12:02 AM

## 2014-03-10 NOTE — Discharge Instructions (Signed)

## 2014-03-11 LAB — GC/CHLAMYDIA PROBE AMP
CT Probe RNA: NEGATIVE
GC Probe RNA: NEGATIVE

## 2014-05-26 ENCOUNTER — Encounter (HOSPITAL_COMMUNITY): Payer: Self-pay

## 2014-05-29 IMAGING — CR DG ABDOMEN ACUTE W/ 1V CHEST
3 series · 3 of 3 positions shown · non-contrast
Comparison: US PELVIS COMPLETE dated 08/29/2013; DG CHEST 2V dated
11/22/2010; CT ANGIO CHEST W/CM &/OR WO/CM dated 07/09/2007

CLINICAL DATA: Constipation.  Hypertension.

EXAM:
ACUTE ABDOMEN SERIES (ABDOMEN 2 VIEW & CHEST 1 VIEW)

[w chest pa]
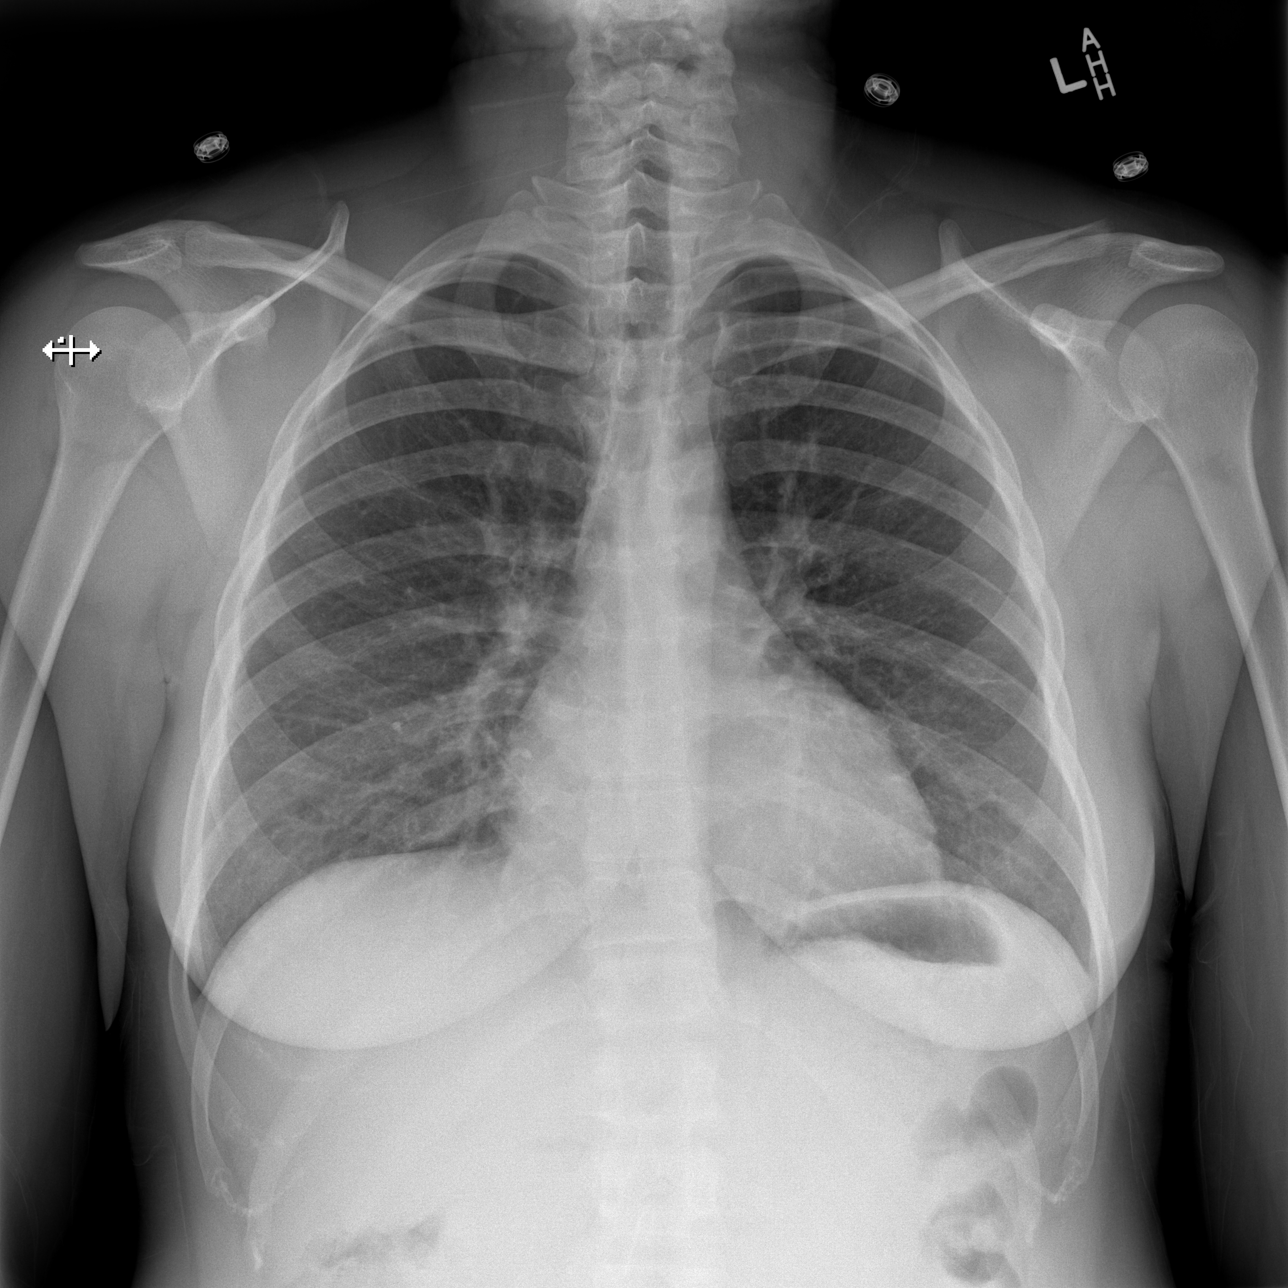

[w abdomen upright]
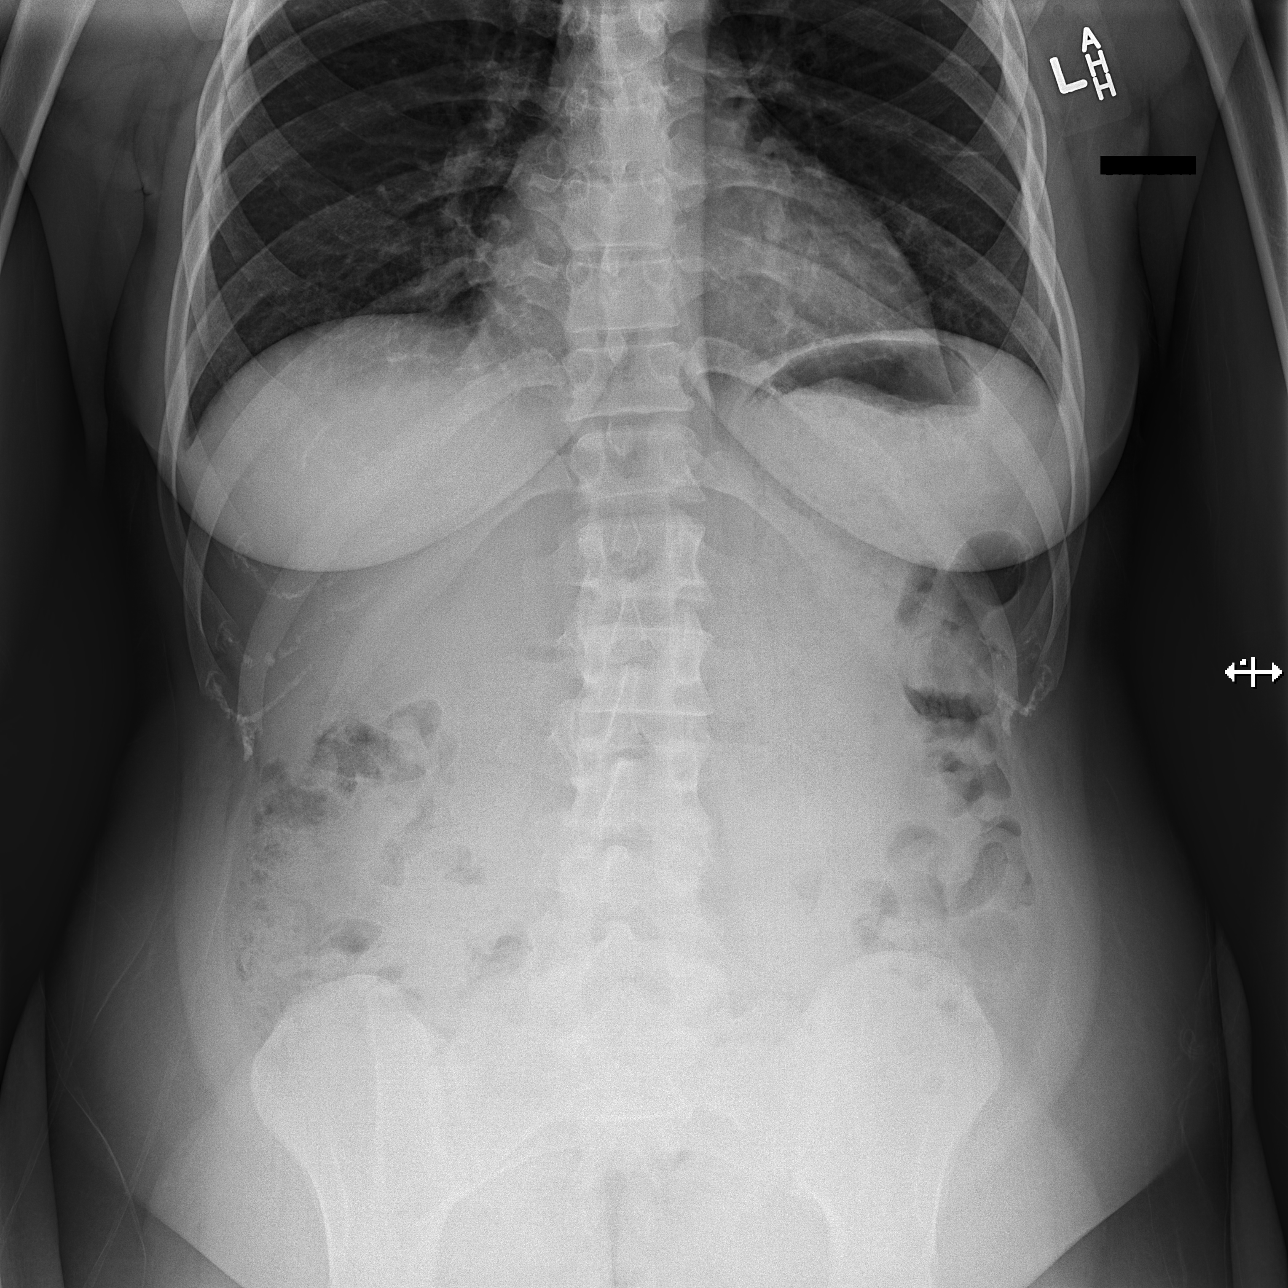

[t abdomen supine]
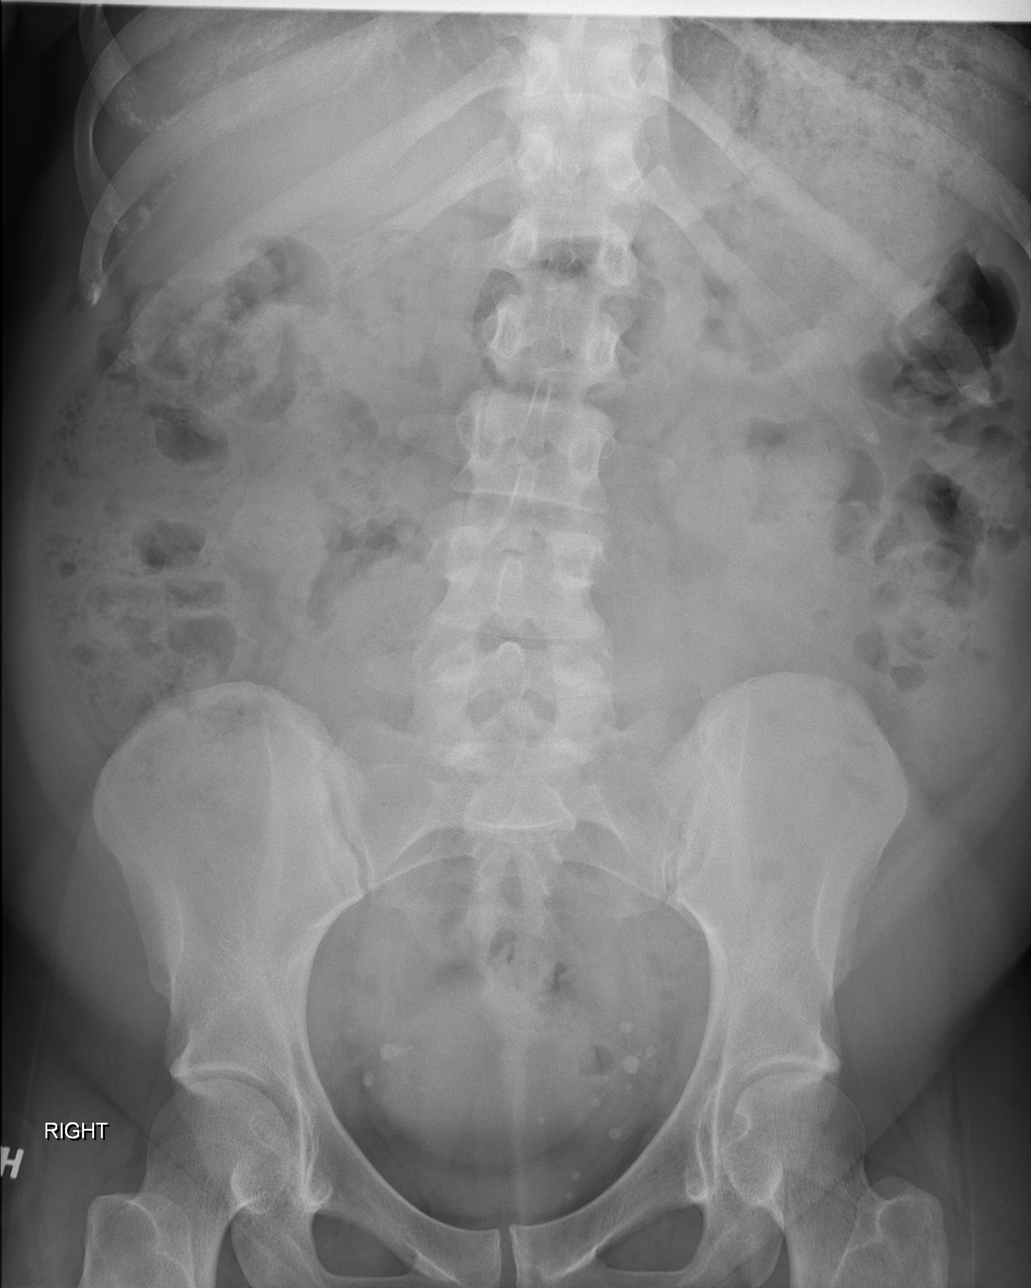

[3 of 3 positions shown; findings below may reference images not displayed]

FINDINGS: The lungs appear clear.  Cardiac and mediastinal contours normal.

No pleural effusion identified.

No free intraperitoneal gas beneath the hemidiaphragms.

Gas and stool noted in the colon. Borderline prominence of proximal
colonic stool. No dilated bowel observed. Scattered vascular
calcifications are present in the anatomic pelvis.
IMPRESSION: 1. Borderline prominence of proximal colonic stool may reflect the
patient's constipation. Otherwise negative.

## 2014-06-08 ENCOUNTER — Encounter (HOSPITAL_COMMUNITY): Payer: Self-pay | Admitting: Emergency Medicine

## 2014-06-08 ENCOUNTER — Emergency Department (HOSPITAL_COMMUNITY)
Admission: EM | Admit: 2014-06-08 | Discharge: 2014-06-09 | Disposition: A | Discharge status: Other Acute Inpatient Hospital | Payer: No Typology Code available for payment source | Attending: Emergency Medicine | Admitting: Emergency Medicine

## 2014-06-08 ENCOUNTER — Ambulatory Visit (HOSPITAL_COMMUNITY)
Admission: RE | Admit: 2014-06-08 | Discharge: 2014-06-08 | Disposition: A | Discharge status: Home / Self Care | Payer: No Typology Code available for payment source | Attending: Psychiatry | Admitting: Psychiatry

## 2014-06-08 DIAGNOSIS — F419 Anxiety disorder, unspecified: Secondary | ICD-10-CM | POA: Insufficient documentation

## 2014-06-08 DIAGNOSIS — I10 Essential (primary) hypertension: Secondary | ICD-10-CM | POA: Insufficient documentation

## 2014-06-08 DIAGNOSIS — K759 Inflammatory liver disease, unspecified: Secondary | ICD-10-CM | POA: Diagnosis not present

## 2014-06-08 DIAGNOSIS — Z8719 Personal history of other diseases of the digestive system: Secondary | ICD-10-CM | POA: Diagnosis not present

## 2014-06-08 DIAGNOSIS — F329 Major depressive disorder, single episode, unspecified: Secondary | ICD-10-CM | POA: Diagnosis not present

## 2014-06-08 DIAGNOSIS — Z791 Long term (current) use of non-steroidal anti-inflammatories (NSAID): Secondary | ICD-10-CM | POA: Insufficient documentation

## 2014-06-08 DIAGNOSIS — F1210 Cannabis abuse, uncomplicated: Secondary | ICD-10-CM | POA: Insufficient documentation

## 2014-06-08 DIAGNOSIS — F3160 Bipolar disorder, current episode mixed, unspecified: Secondary | ICD-10-CM | POA: Diagnosis not present

## 2014-06-08 DIAGNOSIS — Z8619 Personal history of other infectious and parasitic diseases: Secondary | ICD-10-CM | POA: Diagnosis not present

## 2014-06-08 DIAGNOSIS — Z79899 Other long term (current) drug therapy: Secondary | ICD-10-CM | POA: Insufficient documentation

## 2014-06-08 DIAGNOSIS — F315 Bipolar disorder, current episode depressed, severe, with psychotic features: Secondary | ICD-10-CM | POA: Diagnosis not present

## 2014-06-08 DIAGNOSIS — F32A Depression, unspecified: Secondary | ICD-10-CM

## 2014-06-08 DIAGNOSIS — Z720 Tobacco use: Secondary | ICD-10-CM | POA: Insufficient documentation

## 2014-06-08 DIAGNOSIS — Z3202 Encounter for pregnancy test, result negative: Secondary | ICD-10-CM | POA: Diagnosis not present

## 2014-06-08 LAB — CBC WITH DIFFERENTIAL/PLATELET
BASOS ABS: 0 10*3/uL (ref 0.0–0.1)
BASOS PCT: 0 % (ref 0–1)
EOS ABS: 0.2 10*3/uL (ref 0.0–0.7)
EOS PCT: 3 % (ref 0–5)
HCT: 38.6 % (ref 36.0–46.0)
Hemoglobin: 13.4 g/dL (ref 12.0–15.0)
Lymphocytes Relative: 52 % — ABNORMAL HIGH (ref 12–46)
Lymphs Abs: 3.6 10*3/uL (ref 0.7–4.0)
MCH: 35.2 pg — AB (ref 26.0–34.0)
MCHC: 34.7 g/dL (ref 30.0–36.0)
MCV: 101.3 fL — AB (ref 78.0–100.0)
Monocytes Absolute: 0.3 10*3/uL (ref 0.1–1.0)
Monocytes Relative: 5 % (ref 3–12)
Neutro Abs: 2.7 10*3/uL (ref 1.7–7.7)
Neutrophils Relative %: 40 % — ABNORMAL LOW (ref 43–77)
PLATELETS: 144 10*3/uL — AB (ref 150–400)
RBC: 3.81 MIL/uL — ABNORMAL LOW (ref 3.87–5.11)
RDW: 12.6 % (ref 11.5–15.5)
WBC: 6.9 10*3/uL (ref 4.0–10.5)

## 2014-06-08 LAB — COMPREHENSIVE METABOLIC PANEL
ALT: 31 U/L (ref 0–35)
AST: 32 U/L (ref 0–37)
Albumin: 3.9 g/dL (ref 3.5–5.2)
Alkaline Phosphatase: 58 U/L (ref 39–117)
Anion gap: 12 (ref 5–15)
BUN: 10 mg/dL (ref 6–23)
CHLORIDE: 102 meq/L (ref 96–112)
CO2: 23 mEq/L (ref 19–32)
CREATININE: 0.85 mg/dL (ref 0.50–1.10)
Calcium: 9.1 mg/dL (ref 8.4–10.5)
GFR calc Af Amer: >90 mL/min (ref >90)
GFR calc non Af Amer: 85 mL/min — ABNORMAL LOW (ref >90)
GLUCOSE: 78 mg/dL (ref 70–99)
Potassium: 3.8 mEq/L (ref 3.7–5.3)
Sodium: 137 mEq/L (ref 137–147)
Total Bilirubin: 0.4 mg/dL (ref 0.3–1.2)
Total Protein: 8 g/dL (ref 6.0–8.3)

## 2014-06-08 LAB — RAPID URINE DRUG SCREEN, HOSP PERFORMED
AMPHETAMINES: NOT DETECTED
Barbiturates: NOT DETECTED
Benzodiazepines: NOT DETECTED
Cocaine: NOT DETECTED
OPIATES: NOT DETECTED
Tetrahydrocannabinol: POSITIVE — AB

## 2014-06-08 LAB — ACETAMINOPHEN LEVEL: Acetaminophen (Tylenol), Serum: <15 ug/mL (ref 10–30)

## 2014-06-08 LAB — SALICYLATE LEVEL: Salicylate Lvl: <2 mg/dL — ABNORMAL LOW (ref 2.8–20.0)

## 2014-06-08 LAB — ETHANOL: Alcohol, Ethyl (B): <11 mg/dL (ref 0–11)

## 2014-06-08 LAB — PREGNANCY, URINE: PREG TEST UR: NEGATIVE

## 2014-06-08 MED ORDER — ZOLPIDEM TARTRATE 5 MG PO TABS
5.0000 mg | ORAL_TABLET | Freq: Every evening | ORAL | Status: DC | PRN
  Administered 2014-06-08: 5 mg via ORAL
  Filled 2014-06-08: qty 1

## 2014-06-08 MED ORDER — IBUPROFEN 200 MG PO TABS
600.0000 mg | ORAL_TABLET | Freq: Three times a day (TID) | ORAL | Status: DC | PRN
  Administered 2014-06-08: 600 mg via ORAL
  Filled 2014-06-08: qty 3

## 2014-06-08 NOTE — ED Notes (Signed)
Awake. Watching TV. Verbally responsive. Resp even and unlabored. No audible adventitious breath sounds noted.  ABC's intact.

## 2014-06-08 NOTE — ED Provider Notes (Signed)
CSN: [id number]     Arrival date & time 06/08/14  1634 History   First MD Initiated Contact with Patient 06/08/14 1639     No chief complaint on file.    (Consider location/radiation/quality/duration/timing/severity/associated sxs/prior Treatment) HPI  43 year old female presents with increasing depression and suicidal thoughts. 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 She was told she's had this since 2006, and she is angry and wants to hurt the man who gave it to her (she doesn't know where he is). She took 4 extra seroquel 4 nights ago in an "overdose", was trying to sleep. Has been having decreased sleep, only getting around 3 hours per night. Was seen at [facility] 2 days ago and told she needed to be admitted but declined, and now returns seeking voluntary admission.  Past Medical History  Diagnosis Date  . Hepatitis   . Hypertension   . Anxiety   . Depression    No past surgical history on file. Family History  Problem Relation Age of Onset  . Depression Other    History  Substance Use Topics  . Smoking status: Current Every Day Smoker -- 0.50 packs/day    Types: Cigarettes  . Smokeless tobacco: Never Used  . Alcohol Use: No   OB History    Gravida Para Term Preterm AB TAB SAB Ectopic Multiple Living   4 3 3  1   0 1  2     Review of Systems  Gastrointestinal: Negative for vomiting.  Psychiatric/Behavioral: Positive for suicidal ideas, hallucinations, sleep disturbance and dysphoric mood.  All other systems reviewed and are negative.     Allergies  Review of patient's allergies indicates no known allergies.  Home Medications   Prior to Admission medications   Medication Sig Start Date End Date Taking? Authorizing Provider  divalproex (DEPAKOTE ER) 500 MG 24 hr tablet Take 500-1,000 mg by mouth 2 (two) times daily. Take one tablet every morning and 2 tablets at  bedtime for mood stabilization. 05/06/13   Nena Polio, PA-C  HYDROcodone-acetaminophen (NORCO/VICODIN) 5-325 MG per tablet Take 1 tablet by mouth every 6 (six) hours as needed. 09/06/13   Marissa Sciacca, PA-C  hydrOXYzine (VISTARIL) 25 MG capsule Take 25 mg by mouth 2 (two) times daily as needed for anxiety.     Historical Provider, MD  ibuprofen (ADVIL,MOTRIN) 200 MG tablet Take 200 mg by mouth every 6 (six) hours as needed.    Historical Provider, MD  naproxen (NAPROSYN) 500 MG tablet Take 1 tablet (500 mg total) by mouth 2 (two) times daily with a meal. 09/05/13   Larene Pickett, PA-C  polyethylene glycol (MIRALAX / GLYCOLAX) packet Take 17 g by mouth 2 (two) times daily. 10/09/13   Ephraim Hamburger, MD  QUEtiapine (SEROQUEL) 400 MG tablet Take 400 mg by mouth at bedtime.    Historical Provider, MD   BP 118/85 mmHg  Pulse 93  Temp(Src) 98.5 F (36.9 C) (Oral)  Resp 20  SpO2 98% Physical Exam  Constitutional: She is oriented to person, place, and time. She appears well-developed and well-nourished. No distress.  HENT:  Head: Normocephalic and atraumatic.  Right Ear: External ear normal.  Left Ear: External ear normal.  Nose: Nose normal.  Eyes: Right eye exhibits no discharge. Left eye exhibits no discharge.  Cardiovascular: Normal rate, regular rhythm and normal heart sounds.   Pulmonary/Chest: Effort  normal and breath sounds normal.  Abdominal: Soft. There is no tenderness.  Neurological: She is alert and oriented to person, place, and time.  Skin: Skin is warm and dry. She is not diaphoretic.  Psychiatric: She is actively hallucinating. She exhibits a depressed mood. She expresses suicidal ideation.  Nursing note and vitals reviewed.   ED Course  Procedures (including critical care time) Labs Review Labs Reviewed  COMPREHENSIVE METABOLIC PANEL - Abnormal; Notable for the following:    GFR calc non Af Amer 85 (*)    All other components within normal limits  SALICYLATE  LEVEL - Abnormal; Notable for the following:    Salicylate Lvl <2.9 (*)    All other components within normal limits  URINE RAPID DRUG SCREEN (HOSP PERFORMED) - Abnormal; Notable for the following:    Tetrahydrocannabinol POSITIVE (*)    All other components within normal limits  CBC WITH DIFFERENTIAL - Abnormal; Notable for the following:    RBC 3.81 (*)    MCV 101.3 (*)    MCH 35.2 (*)    Platelets 144 (*)    Neutrophils Relative % 40 (*)    Lymphocytes Relative 52 (*)    All other components within normal limits  ACETAMINOPHEN LEVEL  ETHANOL  PREGNANCY, URINE    Imaging Review No results found.   EKG Interpretation   Date/Time:  Sunday June 08 2014 17:02:24 EST Ventricular Rate:  92 PR Interval:  127 QRS Duration: 71 QT Interval:  383 QTC Calculation: 474 R Axis:   56 Text Interpretation:  Normal sinus rhythm No significant change since 2012  Confirmed by [place]  MD, Gilberto Stanforth ([id number]) on 06/08/2014 5:05:52 PM      MDM   Final diagnoses:  Depression    Patient with depression and suicidal thoughts. Behavioral Health is evaluated patient and she is to be admitted to an outside facility when a bed becomes available. At this point she is medically cleared and stable for psychiatric admission.    Ephraim Hamburger, MD 06/08/14 [phone]

## 2014-06-08 NOTE — ED Notes (Signed)
Pt states depression x 1 month after being diagnosed with hep B, reports thoughts of suicide x 1 week, states difficulty sleeping x 1 week, pt reports she took 6 Seroquel 400 mg at one time to help her sleep.

## 2014-06-08 NOTE — ED Notes (Signed)
Pt. To [facility] from ED ambulatory without difficulty, to room 34. Pt. Is alert and oriented, warm and dry in no distress. Pt. Denies SI, HI. Pt. States she is still hearing voices, specifically her kids, grandmother and the devil telling her to get high. Pt. Calm and cooperative. Pt. Encouraged to let Nursing staff know of any concerns or needs.

## 2014-06-08 NOTE — BH Assessment (Signed)
Assessment Note  [name] is an 43 y.o. female.  PT reports riding bus with live-in Boy [name] to [facility]  PT reports current SI w/no plan or intent, current HI towards people from the past w/no plan or intent, current AH and VH of "a little man sitting in this room right now laughing at me."  PT reports experiencing SI w/no plan or intent since Friday, 06-06-2014.  PT Boy [name] provided collateral info "Thursday night (06-05-2014) she took 6 of her sleeping pills and tried to kill herself, we went to [facility] the next day to see her Doctor."  [name] reports telling the PT's Psychiatrist at [facility] on Friday that she attempedt SI the previous night.  [name] reports the Psychiatrist wanted to call mobile crisis and he and the PT declined and he told the Psychiatrist he would watch PT closely.  [name] reports the Psychiatrist "told me to bring her to [facility] if she starting wanting to kill herself again."  The PT reports being given the dx of Bipolar "manic depressive" and being prescribed Seroquel and Vistaril  PT reports experiencing VH and AH for several years.  She reports the AH is sometimes commanding, telling her to "hurt or kill myself."  She report VH is sometimes that of her deceased Daughter who died in 30.  PT reports currently feeling stressed and overwhelmed from issues with her disability, housing, and her 2 teenage children returning from staying w/their Father in [place].  PT reports sleeping approx 3 hrs per night for several weeks and having a poor appetite w/no weight loss.  PT reports smoking THC 2x per wk in the form of 1 to 2 blunts per occassion.  PT reports consuming ETOH 4x per mon of 2 pints of wine per occasion.  PT reports currently seeing Psychiatrist [name]' neal monthly for med management and Therapist [name] every 2 wks at [facility] in [place].  PT was hospitalized at [facility] in 2012 and was seen at [facility] in 2014 for  [place] issues.  The PT and Boy Friend are requesting inpt MH tx.          Axis I: Bipolar, mixed episode by hx Axis II: Deferred Axis IV: economic problems, housing problems and problems with primary support group Axis V: 11-20 some danger of hurting self or others possible OR occasionally fails to maintain minimal personal hygiene OR gross impairment in communication  Past Medical History:  Past Medical History  Diagnosis Date  . Hepatitis   . Hypertension   . Anxiety   . Depression     No past surgical history on file.  Family History:  Family History  Problem Relation Age of Onset  . Depression Other     Social History:  reports that she has been smoking Cigarettes.  She has been smoking about 0.50 packs per day. She has never used smokeless tobacco. She reports that she uses illicit drugs (Marijuana). She reports that she does not drink alcohol.  Additional Social History:     CIWA:   COWS:    Allergies: No Known Allergies  Home Medications:  (Not in a hospital admission)  OB/GYN Status:  No LMP recorded.  General Assessment Data Location of Assessment: [facility] ACT Assessment: Yes Is this a Tele or Face-to-Face Assessment?: Face-to-Face Is this an Initial Assessment or a Re-assessment for this encounter?: Initial Assessment Living Arrangements: Non-relatives/Friends Can pt return to current living arrangement?: Yes  Admission Status: Voluntary Is patient capable of signing voluntary admission?: Yes Transfer from: Home Referral Source: Self/Family/Friend  Medical Screening Exam ([place]) Medical Exam completed: No Reason for MSE not completed: Other: (PT walk-in at [facility])  [place] Arrangements: Non-relatives/Friends Name of Psychiatrist: Dr. Granville Lewis [facility]) Name of Therapist: [name] ([facility])  Education Status Is patient currently in school?: No Current Grade:  N/A Highest grade of school patient has completed: N/A Name of school: N/A Contact person: N/A  Risk to self with the past 6 months Suicidal Ideation: Yes-Currently Present Suicidal Intent: No Is patient at risk for suicide?: Yes Suicidal Plan?: No-Not Currently/Within Last 6 Months Access to Means: No What has been your use of drugs/alcohol within the last 12 months?: Yes ETOH and THC Previous Attempts/Gestures: Yes How many times?: 1 Other Self Harm Risks: No Triggers for Past Attempts: Family contact, Other (Comment) (Finance and housing issues) Intentional Self Injurious Behavior: None Family Suicide History: Unknown Recent stressful life event(s): Financial Problems, Other (Comment) (Housing, children returning home) Persecutory voices/beliefs?: Yes Depression: Yes Depression Symptoms: Insomnia, Isolating, Feeling worthless/self pity Substance abuse history and/or treatment for substance abuse?: Yes Suicide prevention information given to non-admitted patients: Not applicable  Risk to Others within the past 6 months Homicidal Ideation: Yes-Currently Present Thoughts of Harm to Others: Yes-Currently Present Comment - Thoughts of Harm to Others: wants to kill people from her past Current Homicidal Intent: No Current Homicidal Plan: No Access to Homicidal Means: No Identified Victim: People from the past History of harm to others?: No Assessment of Violence: None Noted Violent Behavior Description: N/A Does patient have access to weapons?: No Criminal Charges Pending?: No Does patient have a court date: No  Psychosis Hallucinations: Auditory, Visual Delusions: None noted  Mental Status Report Appear/Hygiene: Unremarkable Eye Contact: Fair Motor Activity: Restlessness Speech: Logical/coherent Level of Consciousness: Alert Mood: Anxious, Depressed Affect: Inconsistent with thought content Anxiety Level: Moderate Thought Processes: Coherent Judgement:  Impaired Orientation: Person, Place, Time, Situation Obsessive Compulsive Thoughts/Behaviors: None  Cognitive Functioning Concentration: Decreased Memory: Recent Intact, Remote Intact IQ: Average Insight: Poor Impulse Control: Poor Appetite: Fair Weight Loss: 0 Weight Gain: 0 Sleep: Decreased Total Hours of Sleep: 3 Vegetative Symptoms: None  ADLScreening [facility]) Patient's cognitive ability adequate to safely complete daily activities?: Yes Patient able to express need for assistance with ADLs?: Yes Independently performs ADLs?: Yes (appropriate for developmental age)  Prior Inpatient Therapy Prior Inpatient Therapy: Yes Prior Therapy Dates: 2012 Prior Therapy Facilty/Provider(s): [facility] Reason for Treatment: SI and psychotic sx  Prior Outpatient Therapy Prior Outpatient Therapy: Yes Prior Therapy Dates: Current Prior Therapy Facilty/Provider(s): [facility] Reason for Treatment: SI and Bipolar I  ADL Screening (condition at time of admission) Patient's cognitive ability adequate to safely complete daily activities?: Yes Is the patient deaf or have difficulty hearing?: No Does the patient have difficulty seeing, even when wearing glasses/contacts?: No Does the patient have difficulty concentrating, remembering, or making decisions?: No Patient able to express need for assistance with ADLs?: Yes Does the patient have difficulty dressing or bathing?: No Independently performs ADLs?: Yes (appropriate for developmental age) Does the patient have difficulty walking or climbing stairs?: No Weakness of Legs: None Weakness of Arms/Hands: None  Home Assistive Devices/Equipment Home Assistive Devices/Equipment: None    Abuse/Neglect Assessment (Assessment to be complete while patient is alone) Physical Abuse: Denies Verbal Abuse: Denies Sexual Abuse: Denies Exploitation  of patient/patient's resources: Denies Self-Neglect: Denies    Consults Spiritual Care Consult Needed: No Social Work Consult Needed: No      Additional Information 1:1 In Past 12 Months?: No CIRT Risk: No Elopement Risk: No Does patient have medical clearance?: No     Disposition:  Disposition Initial Assessment Completed for this Encounter: Yes Disposition of Patient: Inpatient treatment program Type of inpatient treatment program: Adult [facility] does not have beds)  On Site Evaluation by:   Reviewed with Physician:    Dey-Johnson,Cuca Benassi 06/08/2014 4:40 PM

## 2014-06-08 NOTE — ED Notes (Addendum)
Personal items consist of pocketbook, book bag with the following items: shoes, clothing, jacket, and cell phone charger.

## 2014-06-08 NOTE — BH Assessment (Signed)
1630:  Consulted with Charmaine Downs, NP about PT.  Per Reginold Agent; PT needs medical clearance send to [facility] and then seek outside placement because [facility] is at capacity.  1635:  Notified [facility] that PT would be transported there for medical clearance and that [facility] is at capacity.  Nurse Patty acknowledged that the PT would be received there for medical clearance.

## 2014-06-09 ENCOUNTER — Encounter (HOSPITAL_COMMUNITY): Payer: Self-pay | Admitting: Behavioral Health

## 2014-06-09 ENCOUNTER — Encounter (HOSPITAL_COMMUNITY): Payer: Self-pay | Admitting: Psychiatry

## 2014-06-09 ENCOUNTER — Inpatient Hospital Stay (HOSPITAL_COMMUNITY)
Admission: AD | Admit: 2014-06-09 | Discharge: 2014-06-18 | DRG: 885 | Disposition: A | Discharge status: Home / Self Care | Payer: Federal, State, Local not specified - Other | Source: Intra-hospital | Attending: Psychiatry | Admitting: Psychiatry

## 2014-06-09 DIAGNOSIS — N390 Urinary tract infection, site not specified: Secondary | ICD-10-CM | POA: Diagnosis present

## 2014-06-09 DIAGNOSIS — F1220 Cannabis dependence, uncomplicated: Secondary | ICD-10-CM | POA: Insufficient documentation

## 2014-06-09 DIAGNOSIS — F1290 Cannabis use, unspecified, uncomplicated: Secondary | ICD-10-CM | POA: Diagnosis not present

## 2014-06-09 DIAGNOSIS — R45850 Homicidal ideations: Secondary | ICD-10-CM | POA: Diagnosis not present

## 2014-06-09 DIAGNOSIS — F314 Bipolar disorder, current episode depressed, severe, without psychotic features: Secondary | ICD-10-CM | POA: Diagnosis present

## 2014-06-09 DIAGNOSIS — F17210 Nicotine dependence, cigarettes, uncomplicated: Secondary | ICD-10-CM | POA: Diagnosis present

## 2014-06-09 DIAGNOSIS — R45851 Suicidal ideations: Secondary | ICD-10-CM

## 2014-06-09 DIAGNOSIS — F3130 Bipolar disorder, current episode depressed, mild or moderate severity, unspecified: Secondary | ICD-10-CM

## 2014-06-09 DIAGNOSIS — G4700 Insomnia, unspecified: Secondary | ICD-10-CM | POA: Diagnosis present

## 2014-06-09 DIAGNOSIS — F315 Bipolar disorder, current episode depressed, severe, with psychotic features: Principal | ICD-10-CM | POA: Diagnosis present

## 2014-06-09 DIAGNOSIS — F1490 Cocaine use, unspecified, uncomplicated: Secondary | ICD-10-CM | POA: Diagnosis not present

## 2014-06-09 DIAGNOSIS — F329 Major depressive disorder, single episode, unspecified: Secondary | ICD-10-CM | POA: Diagnosis present

## 2014-06-09 MED ORDER — IBUPROFEN 600 MG PO TABS
600.0000 mg | ORAL_TABLET | Freq: Four times a day (QID) | ORAL | Status: DC | PRN
  Administered 2014-06-10 – 2014-06-17 (×11): 600 mg via ORAL
  Filled 2014-06-09 (×11): qty 1

## 2014-06-09 MED ORDER — MAGNESIUM HYDROXIDE 400 MG/5ML PO SUSP
30.0000 mL | Freq: Every day | ORAL | Status: DC | PRN
  Administered 2014-06-16: 30 mL via ORAL
  Filled 2014-06-09: qty 30

## 2014-06-09 MED ORDER — NICOTINE 21 MG/24HR TD PT24
21.0000 mg | MEDICATED_PATCH | Freq: Every day | TRANSDERMAL | Status: DC
  Administered 2014-06-09: 21 mg via TRANSDERMAL
  Filled 2014-06-09: qty 1

## 2014-06-09 MED ORDER — ALUM & MAG HYDROXIDE-SIMETH 200-200-20 MG/5ML PO SUSP: 30.0000 mL | ORAL | Status: DC | PRN

## 2014-06-09 MED ORDER — HYDROXYZINE HCL 25 MG PO TABS
25.0000 mg | ORAL_TABLET | Freq: Four times a day (QID) | ORAL | Status: DC | PRN
  Administered 2014-06-10 – 2014-06-18 (×11): 25 mg via ORAL
  Filled 2014-06-09 (×9): qty 1
  Filled 2014-06-09: qty 20
  Filled 2014-06-09 (×3): qty 1

## 2014-06-09 MED ORDER — NICOTINE 21 MG/24HR TD PT24
21.0000 mg | MEDICATED_PATCH | Freq: Every day | TRANSDERMAL | Status: DC
  Administered 2014-06-10 – 2014-06-18 (×9): 21 mg via TRANSDERMAL
  Filled 2014-06-09 (×12): qty 1

## 2014-06-09 MED ORDER — QUETIAPINE FUMARATE ER 400 MG PO TB24
400.0000 mg | ORAL_TABLET | Freq: Every day | ORAL | Status: DC
  Administered 2014-06-09: 400 mg via ORAL
  Filled 2014-06-09 (×4): qty 1

## 2014-06-09 MED ORDER — ZOLPIDEM TARTRATE 5 MG PO TABS
5.0000 mg | ORAL_TABLET | Freq: Every evening | ORAL | Status: DC | PRN
  Administered 2014-06-09 – 2014-06-10 (×2): 5 mg via ORAL
  Filled 2014-06-09 (×2): qty 1

## 2014-06-09 MED ORDER — IBUPROFEN 200 MG PO TABS
600.0000 mg | ORAL_TABLET | Freq: Three times a day (TID) | ORAL | Status: DC | PRN
  Administered 2014-06-09: 600 mg via ORAL
  Filled 2014-06-09 (×2): qty 3

## 2014-06-09 MED ORDER — ACETAMINOPHEN 325 MG PO TABS: 650.0000 mg | ORAL_TABLET | Freq: Four times a day (QID) | ORAL | Status: DC | PRN

## 2014-06-09 NOTE — ED Notes (Signed)
Pt. C/o insomnia. 

## 2014-06-09 NOTE — ED Notes (Signed)
Patient transferred to [facility] via [employer].  Cooperative throughout the day, but sad and quiet.  Voiced concerns about the man over the doorway in her room, but when I turned the light on and eliminated some shadows, she felt more secure.  Left the unit ambulatory with all belongings.

## 2014-06-09 NOTE — Progress Notes (Signed)
Admission Note  D: Patient admitted to [facility] from [facility]. Patient verbalized that she told her boyfriend that she wanted to kill herself, but did not have a specific plan. Also, she voiced that she's hearing voices that tell her to kill self and seeing a little man that sits on top of the door, laughing at her. Patient reported having depression since 2004 when her grandmother passed.   A: Support and encouragement provided to patient. Oriented patient to the unit and informed her of the rules/policies of the hospital. Initiated Q15 minute checks for safety.  R: Patient receptive. Passive SI, but contracts for safety. Endorses AVH as well. Denies HI. Patient remains safe on the unit.

## 2014-06-09 NOTE — ED Provider Notes (Signed)
Pt stable awaiting placement  Maudry Diego, MD 06/09/14 [phone]

## 2014-06-09 NOTE — Tx Team (Signed)
Initial Interdisciplinary Treatment Plan   PATIENT STRESSORS: Health problems Substance abuse   PATIENT STRENGTHS: Capable of independent living General fund of knowledge Motivation for treatment/growth   PROBLEM LIST: Problem List/Patient Goals Date to be addressed Date deferred Reason deferred Estimated date of resolution  Suicidal Ideation 06/09/14     Depression 06/09/14                                                DISCHARGE CRITERIA:  Ability to meet basic life and health needs Improved stabilization in mood, thinking, and/or behavior Motivation to continue treatment in a less acute level of care  PRELIMINARY DISCHARGE PLAN: Attend aftercare/continuing care group Outpatient therapy Return to previous living arrangement  PATIENT/FAMIILY INVOLVEMENT: This treatment plan has been presented to and reviewed with the patient, [name].  The patient and family have been given the opportunity to ask questions and make suggestions.  Kathlen Brunswick 06/09/2014, 6:43 PM
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
## 2014-06-09 NOTE — [facility] Counselor (Signed)
Pt has been accepted by Kennedy Bucker NP to bed 305-1. Support paperwork signed and faxed to [facility]. [employer] placed in pt's chart.  Arnold Long, [place] Assessment Counselor

## 2014-06-10 ENCOUNTER — Encounter (HOSPITAL_COMMUNITY): Payer: Self-pay | Admitting: Psychiatry

## 2014-06-10 DIAGNOSIS — R45851 Suicidal ideations: Secondary | ICD-10-CM

## 2014-06-10 DIAGNOSIS — R45850 Homicidal ideations: Secondary | ICD-10-CM

## 2014-06-10 MED ORDER — OLANZAPINE 5 MG PO TBDP
5.0000 mg | ORAL_TABLET | Freq: Two times a day (BID) | ORAL | Status: DC
  Administered 2014-06-10 – 2014-06-11 (×2): 5 mg via ORAL
  Filled 2014-06-10 (×4): qty 1

## 2014-06-10 MED ORDER — TRAZODONE HCL 50 MG PO TABS
50.0000 mg | ORAL_TABLET | Freq: Every evening | ORAL | Status: DC | PRN
  Administered 2014-06-10 – 2014-06-11 (×2): 50 mg via ORAL
  Filled 2014-06-10 (×5): qty 1

## 2014-06-10 MED ORDER — LAMOTRIGINE 25 MG PO TABS
25.0000 mg | ORAL_TABLET | Freq: Every day | ORAL | Status: DC
  Administered 2014-06-10 – 2014-06-13 (×4): 25 mg via ORAL
  Filled 2014-06-10 (×7): qty 1
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
## 2014-06-10 NOTE — Progress Notes (Signed)
Patient ID: [name], female   DOB: October 31, 1970, 43 y.o.   MRN: [id number] D: Client visible on the unit, loud, laughing, interacting with peers, then labile irritable upset, and cursing because Seroquel had been discontinued. "I done told them I can't sleep, I take two four hundred Seroquel at home" A: Writer reviewed medications ordered and encouraged client to take the Ambien and I would speak with PA about other medication that may possible be ordered. Staff with Lonell Grandchild, PA received order for Trazodone 50 mg po. Staff will monitor q73min for safety. R: Client reluctantly took the Trazodone "I done told them that stuff don't work." Client is safe on the unit.

## 2014-06-10 NOTE — BHH Suicide Risk Assessment (Signed)
[place] INPATIENT:  Family/Significant Other Suicide Prevention Education  Suicide Prevention Education:  Education Completed; Frederich Balding (pt's boyfriend) [phone] has been identified by the patient as the family member/significant other with whom the patient will be residing, and identified as the person(s) who will aid the patient in the event of a mental health crisis (suicidal ideations/suicide attempt).  With written consent from the patient, the family member/significant other has been provided the following suicide prevention education, prior to the and/or following the discharge of the patient.  The suicide prevention education provided includes the following:  Suicide risk factors  Suicide prevention and interventions  National Suicide Hotline telephone number  [facility] assessment telephone number  [facility] Emergency Assistance [place] and/or Residential Mobile Crisis Unit telephone number  Request made of family/significant other to:  Remove weapons (e.g., guns, rifles, knives), all items previously/currently identified as safety concern.    Remove drugs/medications (over-the-counter, prescriptions, illicit drugs), all items previously/currently identified as a safety concern.  The family member/significant other verbalizes understanding of the suicide prevention education information provided.  The family member/significant other agrees to remove the items of safety concern listed above.  Pt endorsing passive SI today. Passive HI with no identified victim. Pt states "I need to catch up on sleep and get my head right. I would not act on my thoughts, I just have anger toward some people."   Smart, Lanis Storlie [place]al  06/10/2014, 2:52 PM

## 2014-06-10 NOTE — BHH Group Notes (Signed)
[place] LCSW Group Therapy  06/10/2014 , 11:27 AM   Type of Therapy:  Group Therapy  Participation Level:  Active  Participation Quality:  Attentive  Affect:  Appropriate  Cognitive:  Alert  Insight:  Improving  Engagement in Therapy:  Engaged  Modes of Intervention:  Discussion, Exploration and Socialization  Summary of Progress/Problems: Today's group focused on the term Diagnosis.  Participants were asked to define the term, and then pronounce whether it is a negative, positive or neutral term.  Eveleigh identified anger as a negative emotion that she experiences a lot.  She talked about the devil taking over and having power.  A negative way of dealing with this is thoughts of suicide.  She identified her church, her boyfriend as supports that help her through difficult situations.  She tended to monopolize at several points, but was redirectable.  Roque Lias B 06/10/2014 , 11:27 AM
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
## 2014-06-10 NOTE — Progress Notes (Signed)
D. Pt had been up and visible in milieu, was tearful and appeared depressed and spoke about her situation and how things are not gong well for her lately. Pt endorsed auditory hallucinations and spoke about how she just wanted her medications and to be able to go to bed and go to sleep. Pt received seroquel and ambien without incident. A. Support and encouragement provided. R. Safety maintained, will continue to monitor.

## 2014-06-10 NOTE — BHH Suicide Risk Assessment (Signed)
   Nursing information obtained from:    Demographic factors:    Current Mental Status:    Loss Factors:    Historical Factors:    Risk Reduction Factors:    Total Time spent with patient: 45 minutes  CLINICAL FACTORS:   Alcohol/Substance Abuse/Dependencies Previous Psychiatric Diagnoses and Treatments  Psychiatric Specialty Exam: Physical Exam  ROS  Blood pressure 131/90, pulse 80, temperature 98.7 F (37.1 C), temperature source Oral, resp. rate 18, height 5\' 2"  (1.575 m), weight 86.183 kg (190 lb), last menstrual period 06/03/2014.Body mass index is 34.74 kg/(m^2).  General Appearance: Casual  Eye Contact::  Fair  Speech:  Slow  Volume:  Decreased  Mood:  Anxious and Depressed  Affect:  Labile  Thought Process:  Linear  Orientation:  Full (Time, Place, and Person)  Thought Content:  WDL  Suicidal Thoughts:  Yes.  without intent/plan  Homicidal Thoughts:  No  Memory:  Immediate;   Fair Recent;   Fair Remote;   Fair  Judgement:  Impaired  Insight:  Fair  Psychomotor Activity:  Normal  Concentration:  Fair  Recall:  [employer] of Knowledge:Fair  Language: Good  Akathisia:  No  Handed:  Right  AIMS (if indicated):     Assets:  Communication Skills Desire for Improvement  Sleep:  Number of Hours: 5.75   Musculoskeletal: Strength & Muscle Tone: within normal limits Gait & Station: normal Patient leans: N/A  COGNITIVE FEATURES THAT CONTRIBUTE TO RISK:  Closed-mindedness Polarized thinking Thought constriction (tunnel vision)    SUICIDE RISK:   Severe:  Frequent, intense, and enduring suicidal ideation, specific plan, no subjective intent, but some objective markers of intent (i.e., choice of lethal method), the method is accessible, some limited preparatory behavior, evidence of impaired self-control, severe dysphoria/symptomatology, multiple risk factors present, and few if any protective factors, particularly a lack of social support.  PLAN OF CARE:Please see  H&P.   I certify that inpatient services furnished can reasonably be expected to improve the patient's condition.  Maymie Brunke MD 06/10/2014, 3:58 PM

## 2014-06-10 NOTE — Progress Notes (Signed)
D:  Patient's self inventory sheet, patient had poor sleep last night.  Sleep medication was not helpful.  Poor appetite, low energy level, good concentration.  Rated depression and anxiety 8, hopeless 10.  Denied withdrawals.  Denied SI.  Has experienced leg pain, worst pain 320, arms, legs, toes.  Pain medication is not helpful.  Goal is to get SSI, own place, kids back.  Plans to talk out problems, listen to counselor.  Wants help with SSI, get own place.  No discharge plans.  No problems anticipated after discharge. A:  Medications administered per MD orders.  Emotional support and encouragement given patient. R:  Denied SI and HI.  Denied A/V hallucinations.  Safety maintained with 15 minute checks.

## 2014-06-10 NOTE — BHH Group Notes (Signed)
Adult Psychoeducational Group Note  Date:  06/10/2014 Time:  10:33 PM  Group Topic/Focus:  Wrap-Up Group:   The focus of this group is to help patients review their daily goal of treatment and discuss progress on daily workbooks.  Participation Level:  Active  Participation Quality:  Appropriate  Affect:  Appropriate  Cognitive:  Appropriate  Insight: Good  Engagement in Group:  Engaged  Modes of Intervention:  Discussion  Additional Comments:  Skyann said her day was all right and her peers kept her smiling.  She has no discharge plans and she's been trying to go to sleep.  Her support system is God.  Victorino Sparrow A 06/10/2014, 10:33 PM

## 2014-06-10 NOTE — Plan of Care (Signed)
Problem: Consults Goal: Suicide Risk Patient Education (See Patient Education module for education specifics)  Outcome: Completed/Met Date Met:  06/10/14 Discussed with pt.

## 2014-06-10 NOTE — BHH Group Notes (Signed)
The focus of this group is to educate the patient on the purpose and policies of crisis stabilization and provide a format to answer questions about their admission.  The group details unit policies and expectations of patients while admitted.  Patient did not attend 0900 nurse education orientation group this morning.  Patient stayed in bed sleeping.    

## 2014-06-11 LAB — LIPID PANEL
CHOL/HDL RATIO: 2.6 ratio
Cholesterol: 156 mg/dL (ref 0–200)
HDL: 60 mg/dL (ref >39)
LDL Cholesterol: 86 mg/dL (ref 0–99)
Triglycerides: 50 mg/dL (ref <150)
VLDL: 10 mg/dL (ref 0–40)

## 2014-06-11 LAB — HEMOGLOBIN A1C
Hgb A1c MFr Bld: 5.1 % (ref <5.7)
Mean Plasma Glucose: 100 mg/dL (ref <117)

## 2014-06-11 LAB — TSH: TSH: 1.89 u[IU]/mL (ref 0.350–4.500)

## 2014-06-11 MED ORDER — OLANZAPINE 5 MG PO TBDP
5.0000 mg | ORAL_TABLET | Freq: Every day | ORAL | Status: DC
  Administered 2014-06-12 – 2014-06-17 (×6): 5 mg via ORAL
  Filled 2014-06-11 (×7): qty 1

## 2014-06-11 MED ORDER — BENZTROPINE MESYLATE 0.5 MG PO TABS
0.5000 mg | ORAL_TABLET | Freq: Every day | ORAL | Status: DC
  Filled 2014-06-11: qty 1

## 2014-06-11 MED ORDER — TEMAZEPAM 15 MG PO CAPS
15.0000 mg | ORAL_CAPSULE | Freq: Every day | ORAL | Status: DC
  Administered 2014-06-11: 15 mg via ORAL
  Filled 2014-06-11: qty 1

## 2014-06-11 MED ORDER — OLANZAPINE 10 MG PO TBDP
10.0000 mg | ORAL_TABLET | Freq: Every day | ORAL | Status: DC
  Administered 2014-06-12 – 2014-06-17 (×6): 10 mg via ORAL
  Filled 2014-06-11 (×4): qty 1
  Filled 2014-06-11: qty 10
  Filled 2014-06-11 (×2): qty 1
  Filled 2014-06-11: qty 10
  Filled 2014-06-11 (×3): qty 1

## 2014-06-11 MED ORDER — TRAZODONE HCL 100 MG PO TABS
100.0000 mg | ORAL_TABLET | Freq: Every evening | ORAL | Status: DC | PRN
  Administered 2014-06-11: 100 mg via ORAL
  Filled 2014-06-11: qty 1

## 2014-06-11 MED ORDER — BENZTROPINE MESYLATE 0.5 MG PO TABS
0.5000 mg | ORAL_TABLET | ORAL | Status: DC
  Administered 2014-06-11 – 2014-06-18 (×15): 0.5 mg via ORAL
  Filled 2014-06-11 (×12): qty 1
  Filled 2014-06-11: qty 20
  Filled 2014-06-11 (×3): qty 1
  Filled 2014-06-11: qty 20
  Filled 2014-06-11: qty 1
  Filled 2014-06-11 (×2): qty 20
  Filled 2014-06-11: qty 1

## 2014-06-11 NOTE — Progress Notes (Signed)
Pt presents loud, demanding, labile and argumentative with staff. Pt observed throughout the morning cursing and yelling in hallway. Pt stated that she was unable to sleep last night and that she is upset about not being able to take Seroquel. Writer explained to pt med changes made by MD. Pt agreed to the new changes made by MD. Pt continues to be apologetic this afternoon for her rude behaviors but continues to disrupt the milieu. Pt requires redirecting by staff for inappropriate behaviors.  Medications administered as ordered per MD. Verbal support given. Pt encourage to attend groups. 15 minute checks performed for safety.  Pt safety maintained.

## 2014-06-11 NOTE — BHH Group Notes (Signed)
[facility] LCSW Aftercare Discharge Planning Group Note   06/11/2014 10:35 AM  Participation Quality:  Invited-DID NOT ATTEND   Smart, [name]

## 2014-06-11 NOTE — Progress Notes (Signed)
[facility] MD Progress Note  06/11/2014 11:05 AM [name]  MRN:  [id number] Subjective:Pt states " Please just discharge me if you are going to take away all my medications ,I am not sleeping at night ." Objective; Patient seen and chart reviewed.Pt appeared to be very irritable ,loud ,with poor eye contact ,distressed about the changes in her medications, threatening ,using profanity. Pt reports poor sleep 2/2 her not being on her seroquel. Pt reported passive SI ,denied any AH/VH/HI. Pt unwilling to discuss the new medications and asks "pls let me sleep." Per staff pt has been upset about not being on seroquel. Discussed with pt that it was taken away since she had reported that it is not effective yesterday. Discussed that we could try another sleep aid and also change her zyprexa to bedtime. Pt denied any side effects to medications.   Diagnosis:   DSM5: Primary Psychiatric Diagnosis: Bipolar disorder ,type ,major depressive episode ,severe with psychotic features    Secondary Psychiatric Diagnosis: Cannabis use disorder,severe Cocaine use disorder per hx   Non Psychiatric Diagnosis: See pmh   Total Time spent with patient: 30 minutes  ADL's:  Impaired  Sleep: Poor  Appetite:  Fair    Psychiatric Specialty Exam: Physical Exam  ROS  Blood pressure 136/72, pulse 74, temperature 98.7 F (37.1 C), temperature source Oral, resp. rate 18, height 5\' 2"  (1.575 m), weight 86.183 kg (190 lb), last menstrual period 06/03/2014.Body mass index is 34.74 kg/(m^2).  General Appearance: Disheveled  Eye Contact::  Minimal  Speech:  Normal Rate  Volume:  Increased loud ,yelling ,using profanity  Mood:  Anxious, Depressed, Dysphoric, Hopeless and Irritable  Affect:  Labile  Thought Process:  Irrelevant  Orientation:  Full (Time, Place, and Person)  Thought Content:  WDL  Suicidal Thoughts:  Yes.  without intent/plan  Homicidal Thoughts:  No  Memory:  Immediate;   Fair Recent;    Fair Remote;   Fair  Judgement:  Impaired  Insight:  Lacking  Psychomotor Activity:  Increased and Restlessness  Concentration:  Fair  Recall:  [employer] of Knowledge:Good  Language: Good  Akathisia:  No  Handed:  Right  AIMS (if indicated):     Assets:  Communication Skills Desire for Improvement  Sleep:  Number of Hours: 2.25   Musculoskeletal: Strength & Muscle Tone: within normal limits Gait & Station: normal Patient leans: N/A  Current Medications: Current Facility-Administered Medications  Medication Dose Route Frequency Provider Last Rate Last Dose  . acetaminophen (TYLENOL) tablet 650 mg  650 mg Oral Q6H PRN Kennedy Bucker, NP      . alum & mag hydroxide-simeth (MAALOX/MYLANTA) 200-200-20 MG/5ML suspension 30 mL  30 mL Oral Q4H PRN Kennedy Bucker, NP      . benztropine (COGENTIN) tablet 0.5 mg  0.5 mg Oral BH-qamhs Alba Perillo, MD      . hydrOXYzine (ATARAX/VISTARIL) tablet 25 mg  25 mg Oral Q6H PRN Laverle Hobby, PA-C   25 mg at 06/10/14 1603  . ibuprofen (ADVIL,MOTRIN) tablet 600 mg  600 mg Oral Q6H PRN Laverle Hobby, PA-C   600 mg at 06/10/14 1722  . lamoTRIgine (LAMICTAL) tablet 25 mg  25 mg Oral Daily Elmarie Shiley, NP   25 mg at 06/11/14 1022  . magnesium hydroxide (MILK OF MAGNESIA) suspension 30 mL  30 mL Oral Daily PRN Kennedy Bucker, NP      . nicotine (NICODERM CQ - dosed in mg/24 hours) patch 21 mg  21  mg Transdermal Daily Kennedy Bucker, NP   21 mg at 06/11/14 1022  . [START ON 06/12/2014] OLANZapine zydis (ZYPREXA) disintegrating tablet 10 mg  10 mg Oral QHS Amijah Timothy, MD      . Derrill Memo ON 06/12/2014] OLANZapine zydis (ZYPREXA) disintegrating tablet 5 mg  5 mg Oral Daily Rise Traeger, MD      . temazepam (RESTORIL) capsule 15 mg  15 mg Oral QHS Katlin Ciszewski, MD      . traZODone (DESYREL) tablet 100 mg  100 mg Oral QHS PRN Ursula Alert, MD        Lab Results:  Results for orders placed or performed during the hospital encounter of 06/09/14  (from the past 48 hour(s))  Lipid panel     Status: None   Collection Time: 06/11/14  6:25 AM  Result Value Ref Range   Cholesterol 156 0 - 200 mg/dL   Triglycerides 50 <150 mg/dL   HDL 60 >39 mg/dL   Total CHOL/HDL Ratio 2.6 RATIO   VLDL 10 0 - 40 mg/dL   LDL Cholesterol 86 0 - 99 mg/dL    Comment:        Total Cholesterol/HDL:CHD Risk Coronary Heart Disease Risk Table                     Men   Women  1/2 Average Risk   3.4   3.3  Average Risk       5.0   4.4  2 X Average Risk   9.6   7.1  3 X Average Risk  23.4   11.0        Use the calculated Patient Ratio above and the CHD Risk Table to determine the patient's CHD Risk.        ATP III CLASSIFICATION (LDL):  <100     mg/dL   Optimal  100-129  mg/dL   Near or Above                    Optimal  130-159  mg/dL   Borderline  160-189  mg/dL   High  >190     mg/dL   Very High Performed at [facility]   TSH     Status: None   Collection Time: 06/11/14  6:25 AM  Result Value Ref Range   TSH 1.890 0.350 - 4.500 uIU/mL    Comment: Performed at [facility]    Physical Findings: AIMS: Facial and Oral Movements Muscles of Facial Expression: None, normal Lips and Perioral Area: None, normal Jaw: None, normal Tongue: None, normal,Extremity Movements Upper (arms, wrists, hands, fingers): None, normal Lower (legs, knees, ankles, toes): None, normal, Trunk Movements Neck, shoulders, hips: None, normal, Overall Severity Severity of abnormal movements (highest score from questions above): None, normal Incapacitation due to abnormal movements: None, normal Patient's awareness of abnormal movements (rate only patient's report): No Awareness, Dental Status Current problems with teeth and/or dentures?: No Does patient usually wear dentures?: No  CIWA:  CIWA-Ar Total: 1 COWS:  COWS Total Score: 1  Treatment Plan Summary: Daily contact with patient to assess and evaluate symptoms and progress in treatment Medication  management  Plan:   Reviewed past medical records,treatment plan.   Will increase Zyprexa zydis to 15 mg po daily. Change zyprexa zydis pm dose to bedtime. Will add Cogentin 0.5 mg po bid for eps. Will continue Lamictal 25 mg po daily. Will add Restoril 15 mg po qhs for sleep.  Will also continue Trazodone prn. Could discontinue Trazodone once she is stable on another sleep aid. PT eval requested for assistance to walk.  Will continue to monitor vitals ,medication compliance and treatment side effects while patient is here.  Will monitor for medical issues as well as call consult as needed.  Reviewed labs ,TSH -wnl (06/11/14),LIPID PANEL WNL. CSW will start working on disposition.  Patient to participate in therapeutic milieu .       Medical Decision Making Problem Points:  Established problem, worsening (2), New problem, with no additional work-up planned (3), Review of last therapy session (1) and Review of psycho-social stressors (1) Data Points:  Review or order clinical lab tests (1) Review and summation of old records (2) Review of medication regiment & side effects (2) Review of new medications or change in dosage (2)  I certify that inpatient services furnished can reasonably be expected to improve the patient's condition.   Jameelah Watts MD 06/11/2014, 11:05 AM

## 2014-06-11 NOTE — Progress Notes (Signed)
   06/11/14 1602  PT Time Calculation  PT Start Time (ACUTE ONLY) 1438  PT Stop Time (ACUTE ONLY) 1524  PT Time Calculation (min) (ACUTE ONLY) 46 min  PT G-Codes **NOT FOR INPATIENT CLASS**  Functional Assessment Tool Used (clinical judgement)  Functional Limitation Mobility: Walking and moving around  Mobility: Walking and Moving Around Current Status (Q0086) CI  Mobility: Walking and Moving Around Goal Status (P6195) CH  PT General Charges  $$ ACUTE PT VISIT 1 Procedure  PT Evaluation  $Initial PT Evaluation Tier I 1 Procedure  PT Treatments  $Gait Training 8-22 mins  Clay Kasi Lasky, MPT [phone]

## 2014-06-11 NOTE — BHH Group Notes (Signed)
[place] LCSW Group Therapy  06/11/2014 1:34 PM  Type of Therapy:  Group Therapy  Participation Level:  Active  Participation Quality:  Attentive  Affect:  Flat  Cognitive:  Alert  Insight:  Improving  Engagement in Therapy:  Improving  Modes of Intervention:  Discussion, Education, Exploration, Rapport Building, Socialization and Support  Summary of Progress/Problems: [facility] Speaker came to talk about his personal journey with substance abuse and mental illness. The pt processed ways by which to relate to the speaker. [name] speaker provided handouts and educational information pertaining to groups and services offered by the [facility]. [name] showed interest in the anger management group offered by [facility]. She thanked the speaker for playing his guitar and sharing [employer].    Smart, Gar Glance LCSWA 06/11/2014, 1:34 PM

## 2014-06-11 NOTE — Evaluation (Signed)
Physical Therapy Evaluation Patient Details Name: [name] MRN: [id number] DOB: 1971/05/25 Today's Date: 06/11/2014   History of Present Illness  43 yo female admitted with bipolar d/o, suicidal thoughts. Hx of Hep B  Clinical Impression  On eval, pt was supervision level for mobility-able to walk ~30 feet with use of walker-distance limited by pain. Pt reports experiencing fall ~1 year ago-fell onto both knees. Since fall, pt has experienced L LE pain involving knee, thigh, hip areas and swelling of extremity as well. Pt reports she saw physician who had x-rays taken of knee-unsure of results. Given the nature of pt's pain and extended time frame since fall/initial injury, I am uncertain of cause of pt's pain. Pt could benefit from orthopedic consult and imaging (if MD feels this is warranted). In the meantime, recommended to pt that she use walker for ambulation and warm packs as needed. Instructed pt to elevate leg when seated/supine and to perform ankle pumps and quad sets, 5-10 reps, as tolerated (and to discontinue exercises if pain is exacerbated).     Follow Up Recommendations Outpatient PT (after follow up with ortho MD to evaluate L LE pain)    Equipment Recommendations  Rolling walker with 5" wheels (Case manager will need to get this ordered for pt.)    Recommendations for Other Services       Precautions / Restrictions Precautions Precautions: Fall Precaution Comments: has been agitated during [facility] stay Restrictions Weight Bearing Restrictions: No      Mobility  Bed Mobility Overal bed mobility: Independent                Transfers Overall transfer level: Needs assistance Equipment used: Rolling walker (2 wheeled);None Transfers: Sit to/from Stand Sit to Stand: Supervision            Ambulation/Gait Ambulation/Gait assistance: Supervision Ambulation Distance (Feet): 30 Feet (x2) Assistive device: Rolling walker (2 wheeled);Straight cane Gait  Pattern/deviations: Antalgic;Decreased stride length;Decreased stance time - left;Decreased step length - left;Trunk flexed;Decreased weight shift to left     General Gait Details: Assessed gait with straight cane-pt unsteaady and gait signficantly antalgic. Assessed gait with walker-improved steadiness and gait mild-mod antalgic. Pt is using [facility] facility walker for now.   Stairs            Wheelchair Mobility    Modified Rankin (Stroke Patients Only)       Balance                                             Pertinent Vitals/Pain Pain Assessment: 0-10 Pain Score: 10-Worst pain ever Pain Location: L knee Pain Intervention(s): Monitored during session    Home Living Family/patient expects to be discharged to:: Private residence Living Arrangements: Spouse/significant other Available Help at Discharge: Family           Home Equipment: None      Prior Function Level of Independence: Independent               Hand Dominance        Extremity/Trunk Assessment   Upper Extremity Assessment: Overall WFL for tasks assessed           Lower Extremity Assessment: LLE deficits/detail;RLE deficits/detail RLE Deficits / Details: WFL LLE Deficits / Details: Hip passive ROM ~90; A/AA: ~45 degrees. Knee passive ROM: ~45 degrees; A/AA: ~70 degrees. Ankle WFL-some swelling  noted. Knee ext 4/5, hip flex 3+/5, knee flex 3/5--all limited by pain.  Pt reports nerve-like pain radiating down into 3rd toe and up to hip and posterior/anterior thigh areas.  Cervical / Trunk Assessment: Normal  Communication   Communication: No difficulties  Cognition Arousal/Alertness: Awake/alert Behavior During Therapy: WFL for tasks assessed/performed Overall Cognitive Status: Within Functional Limits for tasks assessed                      General Comments      Exercises General Exercises - Lower Extremity Ankle Circles/Pumps: AROM;Both;5  reps;Seated Quad Sets: AROM;Left;5 reps;Seated (long sitting on bed)      Assessment/Plan    PT Assessment Patient needs continued PT services  PT Diagnosis Difficulty walking;Abnormality of gait;Acute pain;Generalized weakness   PT Problem List Decreased strength;Decreased range of motion;Decreased activity tolerance;Decreased balance;Decreased mobility;Pain;Decreased safety awareness;Decreased knowledge of use of DME  PT Treatment Interventions DME instruction;Gait training;Functional mobility training;Therapeutic activities;Therapeutic exercise;Balance training;Patient/family education   PT Goals (Current goals can be found in the Care Plan section) Acute Rehab PT Goals Patient Stated Goal: less pain. "to get my head/psych stuff right" PT Goal Formulation: With patient Time For Goal Achievement: 06/25/14 Potential to Achieve Goals: Fair    Frequency Min 2X/week   Barriers to discharge        Co-evaluation               End of Session   Activity Tolerance: Patient limited by pain Patient left: in bed           Time: 6761-9509 PT Time Calculation (min) (ACUTE ONLY): 46 min   Charges:   PT Evaluation $Initial PT Evaluation Tier I: 1 Procedure PT Treatments $Gait Training: 8-22 mins   PT G Codes:          Weston Anna, MPT Pager: [phone]

## 2014-06-12 LAB — GLUCOSE, CAPILLARY: GLUCOSE-CAPILLARY: 107 mg/dL — AB (ref 70–99)

## 2014-06-12 LAB — URINALYSIS W MICROSCOPIC (NOT AT ARMC)
Bilirubin Urine: NEGATIVE
Glucose, UA: NEGATIVE mg/dL
Hgb urine dipstick: NEGATIVE
KETONES UR: NEGATIVE mg/dL
NITRITE: NEGATIVE
PROTEIN: NEGATIVE mg/dL
Specific Gravity, Urine: 1.007 (ref 1.005–1.030)
Urobilinogen, UA: 0.2 mg/dL (ref 0.0–1.0)
pH: 6 (ref 5.0–8.0)

## 2014-06-12 MED ORDER — TEMAZEPAM 15 MG PO CAPS
30.0000 mg | ORAL_CAPSULE | Freq: Every day | ORAL | Status: DC
Start: 2014-06-12 — End: 2014-06-18
  Administered 2014-06-12 – 2014-06-17 (×6): 30 mg via ORAL
  Filled 2014-06-12 (×6): qty 2

## 2014-06-12 MED ORDER — TRAZODONE HCL 50 MG PO TABS
50.0000 mg | ORAL_TABLET | Freq: Every evening | ORAL | Status: DC | PRN
  Administered 2014-06-12: 50 mg via ORAL
  Filled 2014-06-12: qty 1

## 2014-06-12 NOTE — Progress Notes (Signed)
Patient ID: [name], female   DOB: [birth year], 43 y.o.   MRN: [id number]  D: Patient loud and attention-seeking on unit needing redirection at times. Pt preoccupied with wanting the attention of female peers in milieu, and being disruptive at times. Pt easily redirected. A: Q 15 minute safety checks, encourage staff/peer interaction, group participation and medication compliance. R: Pt compliant with medications and participated in group session. Pt did take a bath in the tub room after group for relaxation. No s/s of distress noted during shift.

## 2014-06-12 NOTE — BHH Group Notes (Signed)
The focus of this group is to educate the patient on the purpose and policies of crisis stabilization and provide a format to answer questions about their admission.  The group details unit policies and expectations of patients while admitted.  Patient did not attend 0900 nurse education orientation group this morning.  Patient stayed in bed sleeping.    

## 2014-06-12 NOTE — Progress Notes (Signed)
Adult Psychoeducational Group Note  Date:  06/12/2014 Time:  1:39 AM  Group Topic/Focus:  Wrap-Up Group:   The focus of this group is to help patients review their daily goal of treatment and discuss progress on daily workbooks.  Participation Level:  Active  Participation Quality:  Attentive  Affect:  Blunted  Cognitive:  Alert  Insight: Good  Engagement in Group:  Engaged  Modes of Intervention:  Discussion  Additional Comments:  Patient stated that her day started off very bad. Patient says that she didn't get any sleep last night and that the doctor is supposed to try something else out tonight for sleep. Patient says that she had a visit from her Mom and her boyfriend. Patient says that her mother apologized to her about some past hurts. Says that the apology made her feel much better and she and her mom are going to work on their relationship.  Donney Rankins 06/12/2014, 1:39 AM

## 2014-06-12 NOTE — Progress Notes (Signed)
Pt presents anxious this morning. Pt reported that her boyfriend was arrested this morning. Pt denies SI/HI/AVH. Pt continues to be loud, intrusive, and disruptive on the milieu. Pt reported that she was able to sleep well last night. Pt compliant with attending groups and taking meds.  Medications given late this morning d/t patient being upset about her boyfriend. Verbal support given. Pt encouraged to attend groups. Pt requires redirecting from staff  for disruptive behaviors. 15 minute checks performed for safety.  Pt safety maintained.

## 2014-06-12 NOTE — Progress Notes (Signed)
[facility] MD Progress Note  06/12/2014 1:48 PM [name]  MRN:  [id number] Subjective:Pt states " I want to show you this since I am urinating a lot and feels like I have blurry vision." Objective; Patient seen and chart reviewed.Pt today is very somatic with multiple complaints ,blurry vision (but wears glasses ,does not have it here), increased urination, tingling sensation all over .Pt continues to have labile mood ,irritability as well as anger management issues. Pt seen in the hallway using "profanity" and can get disruptive at times. Pt reports being distressed about her boyfriend who is currently in police custody for drug charges. Pt reports she does not want to be homeless and be a prostitute anymore and wants to have a place to go to. Pt reports her mother visited and she would like to ask her for support at this point ,but she does not know if that will happen. Patient reports sleep as improving and appetite as fair . Patient denies SI/HI/VH. Reports the "little man " sitting on top of her door is no more and that her AH is coming down. Patient is compliant on medications. Patient denies any side effects.     Diagnosis:   DSM5: Primary Psychiatric Diagnosis: Bipolar disorder ,type ,major depressive episode ,severe with psychotic features    Secondary Psychiatric Diagnosis: Cannabis use disorder,severe Cocaine use disorder per hx   Non Psychiatric Diagnosis: See pmh   Total Time spent with patient: 30 minutes  ADL's:  Impaired  Sleep: Poor  Appetite:  Fair    Psychiatric Specialty Exam: Physical Exam  ROS  Blood pressure 136/72, pulse 74, temperature 98.7 F (37.1 C), temperature source Oral, resp. rate 18, height 5\' 2"  (1.575 m), weight 86.183 kg (190 lb), last menstrual period 06/03/2014.Body mass index is 34.74 kg/(m^2).  General Appearance: Disheveled  Eye Contact::  Good  Speech:  Normal Rate  Volume:  Increased  And at times loud ,yelling ,using  profanity,but improving  Mood:  Anxious, Depressed and Irritable  Affect:  Labile  Thought Process:  Goal Directed  Orientation:  Full (Time, Place, and Person)  Thought Content:  Hallucinations: Auditoryimproving,little man on my door is gone  Suicidal Thoughts:  No  Homicidal Thoughts:  No  Memory:  Immediate;   Fair Recent;   Fair Remote;   Fair  Judgement:  Impaired  Insight:  Lacking  Psychomotor Activity:  Restlessness  Concentration:  Fair  Recall:  [employer] of Knowledge:Good  Language: Good  Akathisia:  No  Handed:  Right  AIMS (if indicated):     Assets:  Communication Skills Desire for Improvement  Sleep:  Number of Hours: 5.75   Musculoskeletal: Strength & Muscle Tone: within normal limits Gait & Station: normal Patient leans: N/A  Current Medications: Current Facility-Administered Medications  Medication Dose Route Frequency Provider Last Rate Last Dose  . acetaminophen (TYLENOL) tablet 650 mg  650 mg Oral Q6H PRN Kennedy Bucker, NP      . alum & mag hydroxide-simeth (MAALOX/MYLANTA) 200-200-20 MG/5ML suspension 30 mL  30 mL Oral Q4H PRN Kennedy Bucker, NP      . benztropine (COGENTIN) tablet 0.5 mg  0.5 mg Oral BH-qamhs Ronnesha Mester, MD   0.5 mg at 06/12/14 1135  . hydrOXYzine (ATARAX/VISTARIL) tablet 25 mg  25 mg Oral Q6H PRN Laverle Hobby, PA-C   25 mg at 06/11/14 2026  . ibuprofen (ADVIL,MOTRIN) tablet 600 mg  600 mg Oral Q6H PRN Laverle Hobby, PA-C  600 mg at 06/12/14 1314  . lamoTRIgine (LAMICTAL) tablet 25 mg  25 mg Oral Daily Elmarie Shiley, NP   25 mg at 06/12/14 1135  . magnesium hydroxide (MILK OF MAGNESIA) suspension 30 mL  30 mL Oral Daily PRN Kennedy Bucker, NP      . nicotine (NICODERM CQ - dosed in mg/24 hours) patch 21 mg  21 mg Transdermal Daily Kennedy Bucker, NP   21 mg at 06/12/14 1134  . OLANZapine zydis (ZYPREXA) disintegrating tablet 10 mg  10 mg Oral QHS Willia Genrich, MD      . OLANZapine zydis (ZYPREXA) disintegrating tablet 5 mg   5 mg Oral Daily Dionicia Cerritos, MD   5 mg at 06/12/14 1135  . temazepam (RESTORIL) capsule 15 mg  15 mg Oral QHS Ursula Alert, MD   15 mg at 06/11/14 2025  . traZODone (DESYREL) tablet 100 mg  100 mg Oral QHS PRN Ursula Alert, MD   100 mg at 06/11/14 2026    Lab Results:  Results for orders placed or performed during the hospital encounter of 06/09/14 (from the past 48 hour(s))  Hemoglobin A1c     Status: None   Collection Time: 06/11/14  6:25 AM  Result Value Ref Range   Hgb A1c MFr Bld 5.1 <5.7 %    Comment: (NOTE)                                                                       According to the ADA Clinical Practice Recommendations for 2011, when HbA1c is used as a screening test:  >=6.5%   Diagnostic of Diabetes Mellitus           (if abnormal result is confirmed) 5.7-6.4%   Increased risk of developing Diabetes Mellitus References:Diagnosis and Classification of Diabetes Mellitus,Diabetes VHQI,6962,95(MWUXL 1):S62-S69 and Standards of Medical Care in         Diabetes - 2011,Diabetes KGMW,1027,25 (Suppl 1):S11-S61.    Mean Plasma Glucose 100 <117 mg/dL    Comment: Performed at [facility]  Lipid panel     Status: None   Collection Time: 06/11/14  6:25 AM  Result Value Ref Range   Cholesterol 156 0 - 200 mg/dL   Triglycerides 50 <150 mg/dL   HDL 60 >39 mg/dL   Total CHOL/HDL Ratio 2.6 RATIO   VLDL 10 0 - 40 mg/dL   LDL Cholesterol 86 0 - 99 mg/dL    Comment:        Total Cholesterol/HDL:CHD Risk Coronary Heart Disease Risk Table                     Men   Women  1/2 Average Risk   3.4   3.3  Average Risk       5.0   4.4  2 X Average Risk   9.6   7.1  3 X Average Risk  23.4   11.0        Use the calculated Patient Ratio above and the CHD Risk Table to determine the patient's CHD Risk.        ATP III CLASSIFICATION (LDL):  <100     mg/dL   Optimal  100-129  mg/dL   Near or Above  Optimal  130-159  mg/dL   Borderline  160-189   mg/dL   High  >190     mg/dL   Very High Performed at [facility]   TSH     Status: None   Collection Time: 06/11/14  6:25 AM  Result Value Ref Range   TSH 1.890 0.350 - 4.500 uIU/mL    Comment: Performed at [facility]    Physical Findings: AIMS: Facial and Oral Movements Muscles of Facial Expression: None, normal Lips and Perioral Area: None, normal Jaw: None, normal Tongue: None, normal,Extremity Movements Upper (arms, wrists, hands, fingers): None, normal Lower (legs, knees, ankles, toes): None, normal, Trunk Movements Neck, shoulders, hips: None, normal, Overall Severity Severity of abnormal movements (highest score from questions above): None, normal Incapacitation due to abnormal movements: None, normal Patient's awareness of abnormal movements (rate only patient's report): No Awareness, Dental Status Current problems with teeth and/or dentures?: No Does patient usually wear dentures?: No  CIWA:  CIWA-Ar Total: 1 COWS:  COWS Total Score: 1  Treatment Plan Summary: Daily contact with patient to assess and evaluate symptoms and progress in treatment Medication management  Plan:   Reviewed past medical records,treatment plan.   Will continue Zyprexa zydis15 mg po daily. Change zyprexa zydis pm dose to bedtime. Will add Cogentin 0.5 mg po bid for eps. Will continue Lamictal 25 mg po daily. Will increase Restoril to 30  mg po qhs for sleep. Will also continue Trazodone prn. Could discontinue Trazodone once she is stable on another sleep aid. PT eval requested for assistance to walk.  Will continue to monitor vitals ,medication compliance and treatment side effects while patient is here.  Will monitor for medical issues as well as call consult as needed.  Reviewed labs ,Hba1c - wnl ,will order CBG monitoring for a few days.Will order UA with culture. CSW will start working on disposition.  Patient to participate in therapeutic milieu .        Medical Decision Making Problem Points:  Established problem, stable/improving (1), New problem, with no additional work-up planned (3), Review of last therapy session (1) and Review of psycho-social stressors (1) Data Points:  Review or order clinical lab tests (1) Review and summation of old records (2) Review of medication regiment & side effects (2) Review of new medications or change in dosage (2)  I certify that inpatient services furnished can reasonably be expected to improve the patient's condition.   Shloka Baldridge MD 06/12/2014, 1:48 PM

## 2014-06-12 NOTE — Progress Notes (Signed)
Patient ID: [name], female   DOB: 1971/03/27, 43 y.o.   MRN: [id number]  D: Patient loud and attention-seeking, needing frequent redirection by staff. Pt cursing and making sexual comments and lewd jokes. RN instructed pt about unit rules. Pt compliant with medications, but did not attend karaoke.  A: Q 15 minute safety checks, redirect as needed, administer medications as ordered. Encourage staff/peer interaction. R: Pt denies SI or plans to harm herself. No s/s of distress noted during shift.

## 2014-06-12 NOTE — [facility] Group Notes (Signed)
[place] LCSW Group Therapy  06/12/2014 3:28 PM   Type of Therapy:  Group Therapy  Participation Level:  Active  Participation Quality:  Attentive  Affect:  Appropriate  Cognitive:  Appropriate  Insight:  Improving  Engagement in Therapy:  Engaged  Modes of Intervention:  Clarification, Education, Exploration and Socialization  Summary of Progress/Problems: Today's group focused on relapse prevention.  We defined the term, and then brainstormed on ways to prevent relapse. [name] needed to be told to not have side conversations in the goup.  She responded appropriately to this intervention.  Later "shushed" another group member.  She talked about her recovery from cocaine and how she has not relapsed since she stopped 3 years ago.  Attributed this to her higher power.  Identified a traumatic phone call she got this AM, and how she feels very supportive by some peers that she has been talking to about the situation.  Went on to explain that she has been learning how to be more selective about choosing whom to speak with.  "I prefer to talk to friendly strangers because there is no history, no judgment and no criticism."  Roque Lias B 06/12/2014 , 3:28 PM

## 2014-06-13 LAB — URINE CULTURE: Colony Count: 70000

## 2014-06-13 MED ORDER — GABAPENTIN 100 MG PO CAPS
100.0000 mg | ORAL_CAPSULE | Freq: Every day | ORAL | Status: DC
  Administered 2014-06-13 – 2014-06-17 (×5): 100 mg via ORAL
  Filled 2014-06-13 (×2): qty 1
  Filled 2014-06-13: qty 10
  Filled 2014-06-13 (×4): qty 1
  Filled 2014-06-13: qty 10

## 2014-06-13 MED ORDER — NITROFURANTOIN MACROCRYSTAL 100 MG PO CAPS
100.0000 mg | ORAL_CAPSULE | Freq: Two times a day (BID) | ORAL | Status: DC
  Administered 2014-06-13 – 2014-06-18 (×10): 100 mg via ORAL
  Filled 2014-06-13 (×7): qty 1
  Filled 2014-06-13: qty 2
  Filled 2014-06-13 (×3): qty 1
  Filled 2014-06-13 (×2): qty 4
  Filled 2014-06-13: qty 1
  Filled 2014-06-13: qty 4
  Filled 2014-06-13: qty 1
  Filled 2014-06-13: qty 4
  Filled 2014-06-13: qty 2

## 2014-06-13 MED ORDER — LAMOTRIGINE 25 MG PO TABS
25.0000 mg | ORAL_TABLET | Freq: Two times a day (BID) | ORAL | Status: DC
  Administered 2014-06-14 – 2014-06-17 (×7): 25 mg via ORAL
  Filled 2014-06-13 (×10): qty 1

## 2014-06-13 NOTE — Progress Notes (Signed)
[facility] MD Progress Note  06/13/2014 2:46 PM [name]  MRN:  [id number] Subjective:Pt states " Every time I love someone ,that person leaves me." Objective; Patient seen and chart reviewed.Pt today appears to be very tearful. Pt reports feeling sad and lonely since her boyfriend got locked up. Pt reports reaching out to mom and is planning to go to her after DC. Pt reports her anxiety and depression is responding to medications but she feels distressed secondary to what happened to her BF. Patient reports sleep as improving and appetite as fair .Reports Restoril works for her sleep ,but may need a higher dose since she is used to being on a high dose of "seroquel.:" Patient denies SI/HI/VH. Reports AH as reduced. Patient is compliant on medications. Patient denies any side effects.     Diagnosis:   DSM5: Primary Psychiatric Diagnosis: Bipolar disorder ,type ,major depressive episode ,severe with psychotic features    Secondary Psychiatric Diagnosis: Cannabis use disorder,severe Cocaine use disorder per hx   Non Psychiatric Diagnosis: UTI   Total Time spent with patient: 30 minutes  ADL's:  Impaired  Sleep: Poor  Appetite:  Fair    Psychiatric Specialty Exam: Physical Exam  ROS  Blood pressure 136/72, pulse 74, temperature 98.7 F (37.1 C), temperature source Oral, resp. rate 18, height 5\' 2"  (1.575 m), weight 86.183 kg (190 lb), last menstrual period 06/03/2014.Body mass index is 34.74 kg/(m^2).  General Appearance: Casual  Eye Contact::  Good  Speech:  Normal Rate  Volume:  Normal   Mood:  Anxious and Depressed  Affect:  Labile and Tearful crying about her BF.  Thought Process:  Goal Directed  Orientation:  Full (Time, Place, and Person)  Thought Content:  Hallucinations: Auditoryimproving,  Suicidal Thoughts:  No  Homicidal Thoughts:  No  Memory:  Immediate;   Fair Recent;   Fair Remote;   Fair  Judgement:  Impaired  Insight:  Lacking  Psychomotor  Activity:  Restlessness  Concentration:  Fair  Recall:  [employer] of Knowledge:Good  Language: Good  Akathisia:  No  Handed:  Right  AIMS (if indicated):     Assets:  Communication Skills Desire for Improvement  Sleep:  Number of Hours: 5.75   Musculoskeletal: Strength & Muscle Tone: within normal limits Gait & Station: normal Patient leans: N/A  Current Medications: Current Facility-Administered Medications  Medication Dose Route Frequency Provider Last Rate Last Dose  . acetaminophen (TYLENOL) tablet 650 mg  650 mg Oral Q6H PRN Kennedy Bucker, NP      . alum & mag hydroxide-simeth (MAALOX/MYLANTA) 200-200-20 MG/5ML suspension 30 mL  30 mL Oral Q4H PRN Kennedy Bucker, NP      . benztropine (COGENTIN) tablet 0.5 mg  0.5 mg Oral BH-qamhs Herminio Kniskern, MD   0.5 mg at 06/13/14 0851  . gabapentin (NEURONTIN) capsule 100 mg  100 mg Oral QHS Sashia Campas, MD      . hydrOXYzine (ATARAX/VISTARIL) tablet 25 mg  25 mg Oral Q6H PRN Laverle Hobby, PA-C   25 mg at 06/13/14 1409  . ibuprofen (ADVIL,MOTRIN) tablet 600 mg  600 mg Oral Q6H PRN Laverle Hobby, PA-C   600 mg at 06/13/14 1319  . lamoTRIgine (LAMICTAL) tablet 25 mg  25 mg Oral BID Jabron Weese, MD      . magnesium hydroxide (MILK OF MAGNESIA) suspension 30 mL  30 mL Oral Daily PRN Kennedy Bucker, NP      . nicotine (NICODERM CQ - dosed in  mg/24 hours) patch 21 mg  21 mg Transdermal Daily Kennedy Bucker, NP   21 mg at 06/13/14 0852  . nitrofurantoin (MACRODANTIN) capsule 100 mg  100 mg Oral Q12H Ardelia Wrede, MD      . OLANZapine zydis (ZYPREXA) disintegrating tablet 10 mg  10 mg Oral QHS Ursula Alert, MD   10 mg at 06/12/14 2042  . OLANZapine zydis (ZYPREXA) disintegrating tablet 5 mg  5 mg Oral Daily Ursula Alert, MD   5 mg at 06/13/14 0852  . temazepam (RESTORIL) capsule 30 mg  30 mg Oral QHS Ursula Alert, MD   30 mg at 06/12/14 2042    Lab Results:  Results for orders placed or performed during the hospital  encounter of 06/09/14 (from the past 48 hour(s))  Glucose, capillary     Status: Abnormal   Collection Time: 06/12/14  4:34 PM  Result Value Ref Range   Glucose-Capillary 107 (H) 70 - 99 mg/dL   Comment 1 Documented in Chart    Comment 2 Notify RN   Urinalysis with microscopic     Status: Abnormal   Collection Time: 06/12/14  5:30 PM  Result Value Ref Range   Color, Urine YELLOW YELLOW   APPearance CLOUDY (A) CLEAR   Specific Gravity, Urine 1.007 1.005 - 1.030   pH 6.0 5.0 - 8.0   Glucose, UA NEGATIVE NEGATIVE mg/dL   Hgb urine dipstick NEGATIVE NEGATIVE   Bilirubin Urine NEGATIVE NEGATIVE   Ketones, ur NEGATIVE NEGATIVE mg/dL   Protein, ur NEGATIVE NEGATIVE mg/dL   Urobilinogen, UA 0.2 0.0 - 1.0 mg/dL   Nitrite NEGATIVE NEGATIVE   Leukocytes, UA SMALL (A) NEGATIVE   WBC, UA 3-6 <3 WBC/hpf    Comment: Performed at [facility]    Physical Findings: AIMS: Facial and Oral Movements Muscles of Facial Expression: None, normal Lips and Perioral Area: None, normal Jaw: None, normal Tongue: None, normal,Extremity Movements Upper (arms, wrists, hands, fingers): None, normal Lower (legs, knees, ankles, toes): None, normal, Trunk Movements Neck, shoulders, hips: None, normal, Overall Severity Severity of abnormal movements (highest score from questions above): None, normal Incapacitation due to abnormal movements: None, normal Patient's awareness of abnormal movements (rate only patient's report): No Awareness, Dental Status Current problems with teeth and/or dentures?: No Does patient usually wear dentures?: No  CIWA:  CIWA-Ar Total: 1 COWS:  COWS Total Score: 1  Treatment Plan Summary: Daily contact with patient to assess and evaluate symptoms and progress in treatment Medication management  Plan:   Reviewed past medical records,treatment plan.   Will continue Zyprexa zydis15 mg po daily. Change zyprexa zydis pm dose to bedtime. Will add Cogentin 0.5 mg  po bid for eps. Will increase Lamictal to 25 mg po bid. Will continue Restoril  30  mg po qhs for sleep.Will add a small dose of Gabapentin at bedtime for reducing her anxiety sx. Will DC trazodone.  Will continue to monitor vitals ,medication compliance and treatment side effects while patient is here.  Will monitor for medical issues as well as call consult as needed.  Reviewed labs ,Hba1c - wnl ,will order CBG monitoring for a few days. UA -few wbc -and pt reports sx of UTI ,so will start a trial of Nitrofurantoin. CSW will start working on disposition.  Patient to participate in therapeutic milieu .       Medical Decision Making Problem Points:  Established problem, stable/improving (1), New problem, with additional work-up planned (4), New problem, with no additional  work-up planned (3), Review of last therapy session (1) and Review of psycho-social stressors (1) Data Points:  Review or order clinical lab tests (1) Review and summation of old records (2) Review of medication regiment & side effects (2) Review of new medications or change in dosage (2)  I certify that inpatient services furnished can reasonably be expected to improve the patient's condition.   Elah Avellino MD 06/13/2014, 2:46 PM

## 2014-06-13 NOTE — Progress Notes (Signed)
D Natallia  Has had a hard time today. Sad, tearful and feeling like " everybody   I love leaves me",  She has spent some of her day, lying in her bed crying, but she has amnaged to get up and come to the dayroom occasionally. She is compliant with her medicaitons and takes them as scheduled. She is started on macrodantin for a uti and is educated by this nurse to increase her intake of water and cranberrya nd acidic juices and hold intake of caffeine and carbonated drinks.   A She completed her morning assessment sheet and on it she wrote she denied SI within the past 24 hrs, she rated her depression, hopelessness and anxiety "11/26/08", respectively and she is unclear what her DC planning will be at this point.   R Safety in place.

## 2014-06-13 NOTE — Tx Team (Signed)
Interdisciplinary Treatment Plan Update (Adult)   Date: 06/13/2014   Time Reviewed: 9:00AM Progress in Treatment:  Attending groups: Yes  Participating in groups:  Yes  Taking medication as prescribed: Yes  Tolerating medication: Yes  Family/Significant othe contact made: SPE completed with pt's bf.  Patient understands diagnosis: Yes, AEB seeking treatment for SI with plan and recent attempt to OD, depression/mood stabilization, AVH, HI "toward people from the past", and for medication stabilization.  Discussing patient identified problems/goals with staff: Yes  Medical problems stabilized or resolved: Yes  Denies suicidal/homicidal ideation: Passive SI/not reporting HI today.  Patient has not harmed self or Others: Yes  New problem(s) identified:  Discharge Plan or Barriers: CSW assessing for appropriate referrals at this time. Pt in bed this morning. CSW will attempt to meet with her to complete PSA/discuss aftercare this afternoon.  Additional comments:This is a 43 year old African-American female. Admitted to [facility] from the [facility] with complaints of suicidal ideations and increased depression. Patient reports, "The cops took me to the hospital early Friday morning. I attempted to jump off of a tall building. I am stressed and overwhelmed. I have a lot of personal/familial issues. I'm tired of stressing. I can't handle it any more. My mother and father don't want to help me or do nothing for me. I want them to love me like their child. I cry a lot. All I have been doing is cry, cry and cry. Then I drink me some beer, smoke me some weed to mellow me out. The beer and weed helps me sleep and relax. I have been trying to my get my disability, but they keep me giving me the run around. I have been depressed since my grandmother died in 2002/11/15. I started smoking weed and drinking alcohol when I was just a child. I need to get on my medicines. I have Bipolar disorder. I take Seroquel,  Risperdal".  11/20: Pt has had a hard time today. Sad, tearful and feeling like " everybody I love leaves me", She has spent some of her day, lying in her bed crying, but she has amnaged to get up and come to the dayroom occasionally. She is compliant with her medicaitons and takes them as scheduled. She is started on macrodantin for a uti and is educated by this nurse to increase her intake of water and cranberrya nd acidic juices and hold intake of caffeine and carbonated drinks. She completed her morning assessment sheet and on it she wrote she denied SI within the past 24 hrs, she rated her depression, hopelessness and anxiety "11/26/08", respectively and she is unclear what her DC planning will be at this point.  Reason for Continuation of Hospitalization: AH Depression/Mood stabilization Medication management  Estimated length of stay: 3-5 days For review of initial/current patient goals, please see plan of care.  Attendees:  Patient:    Family:    Physician: Dr. Shea Evans, MD 06/13/2014   Nursing: Lahoma Rocker RN  06/13/2014   Clinical Social Worker Dailey, Hall  06/13/2014   Other: Roque Lias, LCSW 06/13/2014   Other: Gerline Legacy Nurse CM 06/13/2014   Other: Norberto Sorenson, Community Care Coordinator  06/13/2014   Other: Bonnye Fava, CSW Intern 06/13/2014   Scribe for Treatment Team:  Nira Conn Smart LCSWA 06/13/2014 9:00AM

## 2014-06-13 NOTE — BHH Group Notes (Signed)
[facility] LCSW Aftercare Discharge Planning Group Note   06/13/2014 10:30 AM  Participation Quality:  Invited.  Chose to not attend    [place], Barbaraann Rondo B

## 2014-06-13 NOTE — BHH Group Notes (Signed)
[facility] LCSW Group Therapy  06/13/2014  1:05 PM  Type of Therapy:  Group therapy  Participation Level:  Active  Participation Quality:  Attentive  Affect:  Flat  Cognitive:  Oriented  Insight:  Limited  Engagement in Therapy:  Limited  Modes of Intervention:  Discussion, Socialization  Summary of Progress/Problems:  Chaplain was here to lead a group on themes of hope and courage. Sat quietly for the first part.  Left when another  patient was talking about loss.  Not sure if it was related, but she did not return.     Roque Lias B 06/13/2014 12:41 PM

## 2014-06-14 LAB — GLUCOSE, CAPILLARY
Glucose-Capillary: 78 mg/dL (ref 70–99)
Glucose-Capillary: 87 mg/dL (ref 70–99)
Glucose-Capillary: 83 mg/dL (ref 70–99)

## 2014-06-14 MED ORDER — DOCUSATE SODIUM 100 MG PO CAPS
100.0000 mg | ORAL_CAPSULE | Freq: Two times a day (BID) | ORAL | Status: DC
  Administered 2014-06-14 – 2014-06-18 (×7): 100 mg via ORAL
  Filled 2014-06-14 (×12): qty 1

## 2014-06-14 MED ORDER — POLYETHYLENE GLYCOL 3350 17 G PO PACK: 17.0000 g | PACK | Freq: Every day | ORAL | Status: DC | PRN

## 2014-06-14 NOTE — Progress Notes (Signed)
D Jessa remains loud,  intrusive, sexually inappropriate at times and demonstrating lack of boundaries generally all of the time.    A She takes her meds as ordered, she attends her groups but demonstrates little engagement , saying " just give me drugs..."   R Safety is in place.

## 2014-06-14 NOTE — Progress Notes (Signed)
[facility] MD Progress Note  06/14/2014 3:18 PM [name]  MRN:  [id number] Subjective:Pt states " I am trying to get better." Objective; Patient seen and chart reviewed.Pt today appears to be improving. Pt reports that she talked with her mother yesterday ,but her mother does not want her to go and stay with her 2/2 to having an alcoholic partner at home. Pt however reports mother as very supportive. Pt went on to talk about her daughter and told how much she misses her. Pt reports that she has set some goals to go for her daughter's graduation and wants to make it ,it is next year. Pt appeared to become very emotional when she talked about her family ,but overall pt has been improving ,is more able to cope with her anger issues . Pt reports sleep as improving.   Pt reports her anxiety and depression is responding to medications but she feels distressed secondary to what happened to her BF. Patient denies SI/HI/VH. Reports AH as reduced. Patient is compliant on medications. Patient denies any side effects.Pt reports having constipation and wants help with that.     Diagnosis:   DSM5: Primary Psychiatric Diagnosis: Bipolar disorder ,type ,major depressive episode ,severe with psychotic features    Secondary Psychiatric Diagnosis: Cannabis use disorder,severe Cocaine use disorder per hx   Non Psychiatric Diagnosis: UTI   Total Time spent with patient: 30 minutes  ADL's:  Impaired  Sleep: Poor  Appetite:  Fair    Psychiatric Specialty Exam: Physical Exam  ROS  Blood pressure 143/97, pulse 95, temperature 98.1 F (36.7 C), temperature source Oral, resp. rate 20, height 5\' 2"  (1.575 m), weight 86.183 kg (190 lb), last menstrual period 06/03/2014.Body mass index is 34.74 kg/(m^2).  General Appearance: Casual  Eye Contact::  Good  Speech:  Normal Rate  Volume:  Normal   Mood:  Anxious and Depressed improved  Affect:  Labile   Thought Process:  Goal Directed  Orientation:   Full (Time, Place, and Person)  Thought Content:  Hallucinations: Auditoryimproving,  Suicidal Thoughts:  No  Homicidal Thoughts:  No  Memory:  Immediate;   Fair Recent;   Fair Remote;   Fair  Judgement:  Impaired  Insight:  Lacking  Psychomotor Activity:  Restlessness improving  Concentration:  Fair  Recall:  [employer] of Knowledge:Good  Language: Good  Akathisia:  No  Handed:  Right  AIMS (if indicated):     Assets:  Communication Skills Desire for Improvement  Sleep:  Number of Hours: 6   Musculoskeletal: Strength & Muscle Tone: within normal limits Gait & Station: normal Patient leans: N/A  Current Medications: Current Facility-Administered Medications  Medication Dose Route Frequency Provider Last Rate Last Dose  . acetaminophen (TYLENOL) tablet 650 mg  650 mg Oral Q6H PRN Kennedy Bucker, NP      . alum & mag hydroxide-simeth (MAALOX/MYLANTA) 200-200-20 MG/5ML suspension 30 mL  30 mL Oral Q4H PRN Kennedy Bucker, NP      . benztropine (COGENTIN) tablet 0.5 mg  0.5 mg Oral BH-qamhs Javious Hallisey, MD   0.5 mg at 06/14/14 0828  . docusate sodium (COLACE) capsule 100 mg  100 mg Oral BID Ursula Alert, MD      . gabapentin (NEURONTIN) capsule 100 mg  100 mg Oral QHS Ursula Alert, MD   100 mg at 06/13/14 2102  . hydrOXYzine (ATARAX/VISTARIL) tablet 25 mg  25 mg Oral Q6H PRN Laverle Hobby, PA-C   25 mg at 06/13/14 1409  .  ibuprofen (ADVIL,MOTRIN) tablet 600 mg  600 mg Oral Q6H PRN Laverle Hobby, PA-C   600 mg at 06/14/14 0743  . lamoTRIgine (LAMICTAL) tablet 25 mg  25 mg Oral BID Ursula Alert, MD   25 mg at 06/14/14 0827  . magnesium hydroxide (MILK OF MAGNESIA) suspension 30 mL  30 mL Oral Daily PRN Kennedy Bucker, NP      . nicotine (NICODERM CQ - dosed in mg/24 hours) patch 21 mg  21 mg Transdermal Daily Kennedy Bucker, NP   21 mg at 06/14/14 0800  . nitrofurantoin (MACRODANTIN) capsule 100 mg  100 mg Oral Q12H Mouna Yager, MD   100 mg at 06/14/14 0800  .  OLANZapine zydis (ZYPREXA) disintegrating tablet 10 mg  10 mg Oral QHS Ursula Alert, MD   10 mg at 06/13/14 2102  . OLANZapine zydis (ZYPREXA) disintegrating tablet 5 mg  5 mg Oral Daily Ursula Alert, MD   5 mg at 06/14/14 0828  . temazepam (RESTORIL) capsule 30 mg  30 mg Oral QHS Ursula Alert, MD   30 mg at 06/13/14 2101    Lab Results:  Results for orders placed or performed during the hospital encounter of 06/09/14 (from the past 48 hour(s))  Glucose, capillary     Status: Abnormal   Collection Time: 06/12/14  4:34 PM  Result Value Ref Range   Glucose-Capillary 107 (H) 70 - 99 mg/dL   Comment 1 Documented in Chart    Comment 2 Notify RN   Urinalysis with microscopic     Status: Abnormal   Collection Time: 06/12/14  5:30 PM  Result Value Ref Range   Color, Urine YELLOW YELLOW   APPearance CLOUDY (A) CLEAR   Specific Gravity, Urine 1.007 1.005 - 1.030   pH 6.0 5.0 - 8.0   Glucose, UA NEGATIVE NEGATIVE mg/dL   Hgb urine dipstick NEGATIVE NEGATIVE   Bilirubin Urine NEGATIVE NEGATIVE   Ketones, ur NEGATIVE NEGATIVE mg/dL   Protein, ur NEGATIVE NEGATIVE mg/dL   Urobilinogen, UA 0.2 0.0 - 1.0 mg/dL   Nitrite NEGATIVE NEGATIVE   Leukocytes, UA SMALL (A) NEGATIVE   WBC, UA 3-6 <3 WBC/hpf    Comment: Performed at [facility]  Urine culture     Status: None   Collection Time: 06/12/14  5:30 PM  Result Value Ref Range   Specimen Description      URINE, RANDOM Performed at [facility]    Special Requests      NONE Performed at [facility]      06/13/2014 00:58 Performed at [place]      70,000 COLONIES/ML Performed at [facility]    Culture      Multiple bacterial morphotypes present, none predominant. Suggest appropriate recollection if clinically indicated. Performed at [facility]    Report Status 06/13/2014 FINAL   Glucose, capillary      Status: None   Collection Time: 06/14/14  6:08 AM  Result Value Ref Range   Glucose-Capillary 78 70 - 99 mg/dL   Comment 1 Notify RN    Comment 2 Documented in Chart   Glucose, capillary     Status: None   Collection Time: 06/14/14 12:12 PM  Result Value Ref Range   Glucose-Capillary 87 70 - 99 mg/dL   Comment 1 Notify RN     Physical Findings: AIMS: Facial and Oral Movements Muscles of  Facial Expression: None, normal Lips and Perioral Area: None, normal Jaw: None, normal Tongue: None, normal,Extremity Movements Upper (arms, wrists, hands, fingers): None, normal Lower (legs, knees, ankles, toes): None, normal, Trunk Movements Neck, shoulders, hips: None, normal, Overall Severity Severity of abnormal movements (highest score from questions above): None, normal Incapacitation due to abnormal movements: None, normal Patient's awareness of abnormal movements (rate only patient's report): No Awareness, Dental Status Current problems with teeth and/or dentures?: No Does patient usually wear dentures?: No  CIWA:  CIWA-Ar Total: 1 COWS:  COWS Total Score: 1  Treatment Plan Summary: Daily contact with patient to assess and evaluate symptoms and progress in treatment Medication management  Plan:   Reviewed past medical records,treatment plan.   Will continue Zyprexa zydis15 mg po daily. Change zyprexa zydis pm dose to bedtime. Will add Cogentin 0.5 mg po bid for eps. Will continue  to 25 mg po bid. Will continue Restoril  30  mg po qhs for sleep.Will add a small dose of Gabapentin at bedtime for reducing her anxiety sx. Will DC trazodone.  Will continue to monitor vitals ,medication compliance and treatment side effects while patient is here.  Will monitor for medical issues as well as call consult as needed.  Reviewed labs ,Hba1c - wnl ,will order CBG monitoring for a few days. UA -few wbc -and pt reports sx of UTI ,will continue Nitrofurantoin. CSW will start working on  disposition.  Patient to participate in therapeutic milieu .       Medical Decision Making Problem Points:  Established problem, stable/improving (1), New problem, with additional work-up planned (4), New problem, with no additional work-up planned (3), Review of last therapy session (1) and Review of psycho-social stressors (1) Data Points:  Review or order clinical lab tests (1) Review and summation of old records (2) Review of medication regiment & side effects (2) Review of new medications or change in dosage (2)  I certify that inpatient services furnished can reasonably be expected to improve the patient's condition.   Ajai Terhaar MD 06/14/2014, 3:18 PM

## 2014-06-14 NOTE — Plan of Care (Signed)
Problem: Diagnosis: Increased Risk For Suicide Attempt Goal: STG-Patient Will Attend All Groups On The Unit Outcome: Progressing Pt attended evening group on 06/13/14. Goal: STG-Patient Will Comply With Medication Regime Outcome: Progressing Pt has been compliant with medications this shift.

## 2014-06-14 NOTE — Progress Notes (Signed)
D: Pt has anxious affect and mood.  Pt reports "my boyfriend went to jail yesterday.  I need to get on the good foot and get myself together.  I want to be there for my kids so they can call me from [facility] and visit and have a place to stay."  Pt denise SI/HI.  Pt reports auditory hallucinations, stating "I can't really make it out.  It's jibber jabber.  As long as they stay that way, I'm good."  Pt denies visual hallucinations.  Pt interacts with staff appropriately.  She is flirtatious with female peer.   A: Medications administered per order.  Safety maintained.  Met with pt 1:1 and provided support and encouragement. R: Pt is compliant with medications.  Pt is in no distress.  She verbally contracts for safety.  Denies needs and concerns.  Will continue to monitor and assess for safety.

## 2014-06-14 NOTE — BHH Group Notes (Signed)
[facility] LCSW Group Therapy  06/14/2014 1:05 PM  Type of Therapy:  Group Therapy  Participation Level:  Minimal  Participation Quality:  Inattentive and Resistant  Affect:  Irritable  Cognitive:  Alert and Oriented  Insight:  Lacking  Engagement in Therapy:  Lacking  Modes of Intervention:  Discussion  Summary of Progress/Problems: Patient participated in group today during which the discussion was about coping strategies. The group processed examples of positive coping mechanisms. Each group member identified a positive attribute and the other group members supported by sharing ways they could use that attribute as a positive coping skill. Group proceded through discussion and open dialogue.   Christene Lye 06/14/2014, 1:05 PM

## 2014-06-14 NOTE — Progress Notes (Signed)
Psychoeducational Group Note  Date:  03/03/2012 Time: 1015  Group Topic/Focus:  Identifying Needs:   The focus of this group is to help patients identify their personal needs that have been historically problematic and identify healthy behaviors to address their needs.  Participation Level:  Attentive  Participation Quality: good  Affect: flat  Cognitive:fair    Insight:  some  Engagement in Group: engaged  Additional Comments:

## 2014-06-14 NOTE — Progress Notes (Signed)
Adult Psychoeducational Group Note  Date:  06/14/2014 Time:  9:05 PM  Group Topic/Focus:  Wrap-Up Group:   The focus of this group is to help patients review their daily goal of treatment and discuss progress on daily workbooks.  Participation Level:  Active  Participation Quality:  Appropriate  Affect:  Appropriate  Cognitive:  Appropriate  Insight: Appropriate  Engagement in Group:  Engaged  Modes of Intervention:  Discussion  Additional Comments: The patient attended group.The patient understood the rules at [facility]  [name] 06/14/2014, 9:05 PM

## 2014-06-14 NOTE — Progress Notes (Signed)
D: Pt continues to be loud and intrusive with poor boundaries.  A: Support given. Verbalization encouraged. Pt encouraged to come to staff with any concerns. Medications given as prescribed.  R: Pt is receptive. No complaints of pain or discomfort at this time. Q15 min safety checks maintained. Will continue to monitor pt.

## 2014-06-14 NOTE — Progress Notes (Signed)
Adult Psychoeducational Group Note  Date:  06/14/2014 Time:  12:06 AM  Group Topic/Focus:  Wrap-Up Group:   The focus of this group is to help patients review their daily goal of treatment and discuss progress on daily workbooks.  Participation Level:  Active  Participation Quality:  Appropriate  Affect:  Appropriate  Cognitive:  Appropriate  Insight: Appropriate  Engagement in Group:  Engaged  Modes of Intervention:  Discussion  Additional Comments:  Pt shared that her mother came to visit today, which she enjoyed. Pt states that her mother is a strong positive support for her. She had no goal today. Pt was labile and was touching [name] a lot. She was, however, redirectable. She was pleasant and cooperative.   Harrie Foreman A 06/14/2014, 12:06 AM

## 2014-06-15 LAB — GLUCOSE, CAPILLARY
Glucose-Capillary: 85 mg/dL (ref 70–99)
Glucose-Capillary: 92 mg/dL (ref 70–99)

## 2014-06-15 MED ORDER — ENSURE COMPLETE PO LIQD
237.0000 mL | Freq: Three times a day (TID) | ORAL | Status: DC
  Administered 2014-06-15 – 2014-06-18 (×5): 237 mL via ORAL

## 2014-06-15 NOTE — Progress Notes (Signed)
[facility] MD Progress Note  06/15/2014 2:43 PM [name]  MRN:  [id number] Subjective:Pt states " I am OK ,but I want to know where I am going.' Objective; Patient seen and chart reviewed.Pt today appears to be improving. Pt reports she is improving. Pt would like to go to Mr.Brinson's place since heard about this from a peer. Pt reports sleep as improving.   Pt reports her anxiety and depression is responding to medications but she feels distressed secondary to what happened to her BF. Patient denies SI/HI/VH. Reports AH as reduced. Patient is compliant on medications. Patient denies any side effects. Per staff pt is improving ,no outbursts noted on the unit,pt in better control of her anxiety sx.     Diagnosis:   DSM5: Primary Psychiatric Diagnosis: Bipolar disorder ,type ,major depressive episode ,severe with psychotic features    Secondary Psychiatric Diagnosis: Cannabis use disorder,severe Cocaine use disorder per hx   Non Psychiatric Diagnosis: UTI   Total Time spent with patient: 30 minutes  ADL's:  Impaired  Sleep: Fair  Appetite:  Fair    Psychiatric Specialty Exam: Physical Exam  ROS  Blood pressure 122/94, pulse 136, temperature 98.2 F (36.8 C), temperature source Oral, resp. rate 18, height 5\' 2"  (1.575 m), weight 86.183 kg (190 lb), last menstrual period 06/03/2014.Body mass index is 34.74 kg/(m^2).  General Appearance: Casual  Eye Contact::  Good  Speech:  Normal Rate  Volume:  Normal   Mood:  Anxious and Depressed improved  Affect:  Labile   Thought Process:  Goal Directed  Orientation:  Full (Time, Place, and Person)  Thought Content:  Hallucinations: Auditoryimproving,  Suicidal Thoughts:  No  Homicidal Thoughts:  No  Memory:  Immediate;   Fair Recent;   Fair Remote;   Fair  Judgement:  Impaired  Insight:  Lacking  Psychomotor Activity:  Restlessness improving  Concentration:  Fair  Recall:  [place] of Knowledge:Good  Language: Good   Akathisia:  No  Handed:  Right  AIMS (if indicated):     Assets:  Communication Skills Desire for Improvement  Sleep:  Number of Hours: 6   Musculoskeletal: Strength & Muscle Tone: within normal limits Gait & Station: normal Patient leans: N/A  Current Medications: Current Facility-Administered Medications  Medication Dose Route Frequency Provider Last Rate Last Dose  . acetaminophen (TYLENOL) tablet 650 mg  650 mg Oral Q6H PRN Kennedy Bucker, NP      . alum & mag hydroxide-simeth (MAALOX/MYLANTA) 200-200-20 MG/5ML suspension 30 mL  30 mL Oral Q4H PRN Kennedy Bucker, NP      . benztropine (COGENTIN) tablet 0.5 mg  0.5 mg Oral BH-qamhs Sagar Tengan, MD   0.5 mg at 06/15/14 0855  . docusate sodium (COLACE) capsule 100 mg  100 mg Oral BID Ursula Alert, MD   100 mg at 06/15/14 0856  . gabapentin (NEURONTIN) capsule 100 mg  100 mg Oral QHS Odai Wimmer, MD   100 mg at 06/14/14 2115  . hydrOXYzine (ATARAX/VISTARIL) tablet 25 mg  25 mg Oral Q6H PRN Laverle Hobby, PA-C   25 mg at 06/14/14 2119  . ibuprofen (ADVIL,MOTRIN) tablet 600 mg  600 mg Oral Q6H PRN Laverle Hobby, PA-C   600 mg at 06/15/14 0630  . lamoTRIgine (LAMICTAL) tablet 25 mg  25 mg Oral BID Ursula Alert, MD   25 mg at 06/15/14 0856  . magnesium hydroxide (MILK OF MAGNESIA) suspension 30 mL  30 mL Oral Daily PRN Kennedy Bucker,  NP      . nicotine (NICODERM CQ - dosed in mg/24 hours) patch 21 mg  21 mg Transdermal Daily Kennedy Bucker, NP   21 mg at 06/15/14 0857  . nitrofurantoin (MACRODANTIN) capsule 100 mg  100 mg Oral Q12H Itzel Lowrimore, MD   100 mg at 06/15/14 0856  . OLANZapine zydis (ZYPREXA) disintegrating tablet 10 mg  10 mg Oral QHS Ursula Alert, MD   10 mg at 06/14/14 2115  . OLANZapine zydis (ZYPREXA) disintegrating tablet 5 mg  5 mg Oral Daily Ursula Alert, MD   5 mg at 06/15/14 0856  . polyethylene glycol (MIRALAX / GLYCOLAX) packet 17 g  17 g Oral Daily PRN Ursula Alert, MD      . temazepam  (RESTORIL) capsule 30 mg  30 mg Oral QHS Ursula Alert, MD   30 mg at 06/14/14 2115    Lab Results:  Results for orders placed or performed during the hospital encounter of 06/09/14 (from the past 48 hour(s))  Glucose, capillary     Status: None   Collection Time: 06/14/14  6:08 AM  Result Value Ref Range   Glucose-Capillary 78 70 - 99 mg/dL   Comment 1 Notify RN    Comment 2 Documented in Chart   Glucose, capillary     Status: None   Collection Time: 06/14/14 12:12 PM  Result Value Ref Range   Glucose-Capillary 87 70 - 99 mg/dL   Comment 1 Notify RN   Glucose, capillary     Status: None   Collection Time: 06/14/14  4:42 PM  Result Value Ref Range   Glucose-Capillary 83 70 - 99 mg/dL   Comment 1 Notify RN   Glucose, capillary     Status: None   Collection Time: 06/15/14  6:12 AM  Result Value Ref Range   Glucose-Capillary 85 70 - 99 mg/dL    Physical Findings: AIMS: Facial and Oral Movements Muscles of Facial Expression: None, normal Lips and Perioral Area: None, normal Jaw: None, normal Tongue: None, normal,Extremity Movements Upper (arms, wrists, hands, fingers): None, normal Lower (legs, knees, ankles, toes): None, normal, Trunk Movements Neck, shoulders, hips: None, normal, Overall Severity Severity of abnormal movements (highest score from questions above): None, normal Incapacitation due to abnormal movements: None, normal Patient's awareness of abnormal movements (rate only patient's report): No Awareness, Dental Status Current problems with teeth and/or dentures?: No Does patient usually wear dentures?: No  CIWA:  CIWA-Ar Total: 1 COWS:  COWS Total Score: 1  Treatment Plan Summary: Daily contact with patient to assess and evaluate symptoms and progress in treatment Medication management  Plan:   Reviewed past medical records,treatment plan.   Will continue Zyprexa zydis15 mg po daily. Change zyprexa zydis pm dose to bedtime. Will add Cogentin 0.5 mg po  bid for eps. Will continue Lamictal  25 mg po bid. Will continue Restoril  30  mg po qhs for sleep.Will continue a small dose of Gabapentin at bedtime for reducing her anxiety sx.   Will continue to monitor vitals ,medication compliance and treatment side effects while patient is here.  Will monitor for medical issues as well as call consult as needed.  Reviewed labs ,Hba1c - wnl ,will order CBG monitoring for a few days. UA -few wbc -and pt reports sx of UTI ,will continue Nitrofurantoin.   CSW will start working on disposition. Patient to participate in therapeutic milieu .       Medical Decision Making Problem Points:  Established problem, stable/improving (  1), New problem, with additional work-up planned (4), New problem, with no additional work-up planned (3), Review of last therapy session (1) and Review of psycho-social stressors (1) Data Points:  Review or order clinical lab tests (1) Review and summation of old records (2) Review of medication regiment & side effects (2) Review of new medications or change in dosage (2)  I certify that inpatient services furnished can reasonably be expected to improve the patient's condition.   Pasha Broad MD 06/15/2014, 2:43 PM

## 2014-06-15 NOTE — Progress Notes (Signed)
Psychoeducational Group Note  Date:  06/15/2014 Time: 1015 Group Topic/Focus:  Making Healthy Choices:   The focus of this group is to help patients identify negative/unhealthy choices they were using prior to admission and identify positive/healthier coping strategies to replace them upon discharge.  Participation Level:  Did Not Attend   Additional Comments:    Lauralyn Primes 1:55 PM. 06/15/2014

## 2014-06-15 NOTE — Progress Notes (Signed)
D Leandrea remains loud, intrusive and minimally engaged.   A She takes her meds as planned, she interacts with the staff and her peers.   R Safety in place. DC plan in limbo.

## 2014-06-15 NOTE — BHH Group Notes (Signed)
[facility] LCSW Group Therapy  06/15/2014 1:09 PM  Type of Therapy:  Group Therapy  Participation Level:  Did Not Attend   Christene Lye 06/15/2014, 1:09 PM

## 2014-06-16 DIAGNOSIS — F1490 Cocaine use, unspecified, uncomplicated: Secondary | ICD-10-CM

## 2014-06-16 DIAGNOSIS — F314 Bipolar disorder, current episode depressed, severe, without psychotic features: Secondary | ICD-10-CM

## 2014-06-16 DIAGNOSIS — F1290 Cannabis use, unspecified, uncomplicated: Secondary | ICD-10-CM

## 2014-06-16 LAB — GLUCOSE, CAPILLARY: GLUCOSE-CAPILLARY: 97 mg/dL (ref 70–99)

## 2014-06-16 NOTE — BHH Group Notes (Signed)
[facility] LCSW Group Therapy  06/16/2014 1:47 PM  Type of Therapy:  Group Therapy  Participation Level:  Minimal-pt called out after group began by RN and did not return.   Summary of Progress/Problems: Group Topic-Resiliency and Vulnerability: Group members were asked to define resiliency and vulnerability and explore how resiliency and vulnerability have affected their lives. Group members were invited to share personal stories detailing their resiliency and vulnerability factors and explore how resilient/vulnerable they feel in their current situations.   Smart, Laural Eiland LCSWA 06/16/2014, 1:47 PM

## 2014-06-16 NOTE — Progress Notes (Signed)
Patient in day room at the beginning of the shift. She appeared sad and irritable, unwilling to answer question or participate in the assessment. Patient complaint of having pain and rated her pain at "over ten" on the scale of 1-10 with 10 the worst. She sated that she wanted something stronger and that the physician promised him something stronger for the pain. Writer checked patient medication profile and she only had Ibuprofen and it's not due for a repeat at that time. Patient attended group. She denied SI but endorsed HI but won't mention the name of the person; "If he walks through the door right now, there would be an HI". Q 15 minute check continues as ordered to maintain safety.

## 2014-06-16 NOTE — Progress Notes (Signed)
Pt has been observed sitting in the dayroom earlier watching TV.  She attended evening group, then went to the tub room to take a bath.  Pt continues to be loud and intrusive, but she has been cooperative with staff tonight.  Pt is med compliant.  She is still trying to find a place to go at discharge.  Her anxiety stems from the fact that she does not have a place to go yet.  Pt denies SI/HI/AV at this time, although she says she still hears voices from time to time.  Pt makes her needs known to staff.  Support and encouragement offered.  Safety maintained with q15 minute checks.

## 2014-06-16 NOTE — Progress Notes (Signed)
D: Patient denies SI and A/V hallucinations; patient reports sleep is fair; reports appetite is fair; reports energy level is low ; reports ability to concentrate is poor; rates depression as 7/10; rates hopelessness 7/10; rates anxiety as 10/10; patient reports that she is HI to hurt someone else and when asked to specify she stated " the person who hurt me"; patient would not give any further details and treatment team informed on 06/16/14 included Heather CSW and Dr. Parke Poisson   A: Monitored q 15 minutes; patient encouraged to attend groups; patient educated about medications; patient given medications per physician orders; patient encouraged to express feelings and/or concerns  R: Patient is complaining of abdominal pain and Dr. Parke Poisson made aware; patient is also patient is reporting that she does no have anywhere to go and that she does not want to go back to drugs and alcohol; patient is  patient's interaction with staff and peers is appropriate; patient was able to set goal to talk with staff 1:1 when having feelings of SI; patient is taking medications as prescribed and tolerating medications; patient is attending some groups

## 2014-06-16 NOTE — BHH Group Notes (Signed)
[facility] LCSW Aftercare Discharge Planning Group Note   06/16/2014 10:53 AM  Participation Quality:  Invited-DID NOT ATTEND   Smart, Alicia Amel

## 2014-06-16 NOTE — BHH Group Notes (Signed)
Adult Psychoeducational Group Note  Date:  06/16/2014 Time:  6:20 AM  Group Topic/Focus:  Wrap-Up Group:    Participation Level:  Active  Participation Quality:  Intrusive  Affect:  Irritable  Cognitive:  Oriented  Insight: Limited  Engagement in Group:  Distracting  Modes of Intervention:  Discussion and Support  Additional Comments:  Pt described as being "peace", states that she feels "50/50" from when she came in.  Pt was intrusive throughout the group and had to comment after everyone spoke.  Would talk out of turn as well but could re-directed.   Rick Duff Coursey 06/16/2014, 6:20 AM

## 2014-06-16 NOTE — Progress Notes (Signed)
Physical Therapy Treatment Patient Details Name: [name] MRN: [id number] DOB: 06-Jan-1971 Today's Date: 06/16/2014    History of Present Illness 43 yo female admitted with bipolar d/o, suicidal thoughts. Hx of Hep B    PT Comments    Patient dwells a lot on her situation, reports that she has  Been told  That L knee is "bone on bone". Recommended that patient continue exercises, reviewed them with patient. Patient could benefit from single point cane but RN recommended not leaving a cane with patient at this time for safety. Will return to  Evaluate patient with a cane as patient appears to be ambulating well.   Follow Up Recommendations        Equipment Recommendations  Rolling walker with 5" wheels;Cane (pt reports RW causes shoulders to ache)    Recommendations for Other Services       Precautions / Restrictions Precautions Precautions: Fall Precaution Comments: has been agitated during [facility] stay    Mobility  Bed Mobility                  Transfers Overall transfer level: Independent     Sit to Stand: Independent         General transfer comment: pt demonstrates decreased weight on LLE durubg sit, stand, decreased knee flexion  Ambulation/Gait Ambulation/Gait assistance: Supervision Ambulation Distance (Feet): 100 Feet (x 2) Assistive device: None Gait Pattern/deviations: Step-through pattern;Step-to pattern;Antalgic     General Gait Details: decreased weight shift to L,  lurches/lateral side bends  at times to L during stance   Stairs            Wheelchair Mobility    Modified Rankin (Stroke Patients Only)       Balance                                    Cognition Arousal/Alertness: Awake/alert Behavior During Therapy: Anxious Overall Cognitive Status: Impaired/Different from baseline       Memory: Decreased short-term memory         General Comments: tangential , onesided conversation,     Exercises  General Exercises - Lower Extremity Ankle Circles/Pumps: AROM;Both;5 reps;Seated Quad Sets: AROM;Left;5 reps;Seated    General Comments        Pertinent Vitals/Pain Pain Score: 10-Worst pain ever Pain Location: L knee Pain Descriptors / Indicators: Aching;Throbbing;Shooting Pain Intervention(s): Monitored during session    Home Living                      Prior Function            PT Goals (current goals can now be found in the care plan section) Progress towards PT goals: Progressing toward goals    Frequency  Min 2X/week    PT Plan Current plan remains appropriate;Equipment recommendations need to be updated    Co-evaluation             End of Session   Activity Tolerance: Patient limited by pain Patient left:  (walking into daY ROOM FOR GROUP)     Time: 4098-1191 PT Time Calculation (min) (ACUTE ONLY): 30 min  Charges:  $Gait Training: 8-22 mins $Therapeutic Exercise: 8-22 mins                    G Codes:      Claretha Cooper 06/16/2014, 2:14 PM

## 2014-06-16 NOTE — Progress Notes (Signed)
Patient ID: [name], female   DOB: 18-Oct-1970, 43 y.o.   MRN: [id number] [facility] MD Progress Note  06/16/2014 3:51 PM PERNIE GROSSO  MRN:  [id number] Subjective: Patient reports she continues to feel sad, particularly regarding her psychosocial stressors. She states her grandmother was the closest person she had and she is now deceased. She states her boyfriend was also supportive but he is now incarcerated. She ruminates about what social support system she will have after she is discharged from unit.  Objective: I have discussed case with staff and have met with patient. The report from staff is that patient has been intermittently irritable, sometimes loud and intrusive. She does appear to be improving gradually. At this time patient ruminates, as above, about losses, primarily her concern that boyfriend is currently incarcerated and this means she will have less of a support system when she is discharged. She does not endorse medication side effects and is denying any current SI. Group /milieu participation has been limited   Glucose- Fingerstick- 97 today   Diagnosis:   DSM5: Primary Psychiatric Diagnosis: Bipolar disorder ,type ,major depressive episode ,severe with psychotic features    Secondary Psychiatric Diagnosis: Cannabis use disorder,severe Cocaine use disorder per hx   Non Psychiatric Diagnosis: UTI   Total Time spent with patient: 30 minutes  ADL's:  Impaired  Sleep: Fair  Appetite:  Fair    Psychiatric Specialty Exam: Physical Exam  ROS  Blood pressure 121/81, pulse 79, temperature 98.2 F (36.8 C), temperature source Oral, resp. rate 16, height $RemoveBe'5\' 2"'QcBPbjdyo$  (1.575 m), weight 86.183 kg (190 lb), last menstrual period 06/03/2014.Body mass index is 34.74 kg/(m^2).  General Appearance: Fairly Groomed  Engineer, water::  Good  Speech:  Normal Rate  Volume:  Normal   Mood:  Depressed  Affect:  Labile - tearful at times when discussing stressors as above   Thought  Process:  Goal Directed  Orientation:  Other:  fully alert and attentive  Thought Content:  at this time does not endorse current hallucinaitons, and does not appear internally preoccupied. No delusions expressed  Suicidal Thoughts:  No at this time denies  thoughts of hurting self and  contracts for safety on unit   Homicidal Thoughts:  No  Memory:  Recent and Remote Fair  Judgement:  Impaired  Insight:  Lacking  Psychomotor Activity:  Normal   Concentration:  Fair  Recall:  [place] of Knowledge:Good  Language: Good  Akathisia:  No  Handed:  Right  AIMS (if indicated):     Assets:  Communication Skills Desire for Improvement  Sleep:  Number of Hours: 6   Musculoskeletal: Strength & Muscle Tone: within normal limits Gait & Station: normal Patient leans: N/A  Current Medications: Current Facility-Administered Medications  Medication Dose Route Frequency Provider Last Rate Last Dose  . acetaminophen (TYLENOL) tablet 650 mg  650 mg Oral Q6H PRN Kennedy Bucker, NP      . alum & mag hydroxide-simeth (MAALOX/MYLANTA) 200-200-20 MG/5ML suspension 30 mL  30 mL Oral Q4H PRN Kennedy Bucker, NP      . benztropine (COGENTIN) tablet 0.5 mg  0.5 mg Oral BH-qamhs Saramma Eappen, MD   0.5 mg at 06/16/14 0841  . docusate sodium (COLACE) capsule 100 mg  100 mg Oral BID Ursula Alert, MD   100 mg at 06/15/14 2041  . feeding supplement (ENSURE COMPLETE) (ENSURE COMPLETE) liquid 237 mL  237 mL Oral TID BM Saramma Eappen, MD   237 mL at  06/15/14 2123  . gabapentin (NEURONTIN) capsule 100 mg  100 mg Oral QHS Saramma Eappen, MD   100 mg at 06/15/14 2123  . hydrOXYzine (ATARAX/VISTARIL) tablet 25 mg  25 mg Oral Q6H PRN Laverle Hobby, PA-C   25 mg at 06/16/14 0845  . ibuprofen (ADVIL,MOTRIN) tablet 600 mg  600 mg Oral Q6H PRN Laverle Hobby, PA-C   600 mg at 06/16/14 1452  . lamoTRIgine (LAMICTAL) tablet 25 mg  25 mg Oral BID Ursula Alert, MD   25 mg at 06/16/14 0841  . magnesium hydroxide (MILK  OF MAGNESIA) suspension 30 mL  30 mL Oral Daily PRN Kennedy Bucker, NP   30 mL at 06/16/14 1148  . nicotine (NICODERM CQ - dosed in mg/24 hours) patch 21 mg  21 mg Transdermal Daily Kennedy Bucker, NP   21 mg at 06/16/14 0841  . nitrofurantoin (MACRODANTIN) capsule 100 mg  100 mg Oral Q12H Saramma Eappen, MD   100 mg at 06/16/14 0844  . OLANZapine zydis (ZYPREXA) disintegrating tablet 10 mg  10 mg Oral QHS Ursula Alert, MD   10 mg at 06/15/14 2123  . OLANZapine zydis (ZYPREXA) disintegrating tablet 5 mg  5 mg Oral Daily Ursula Alert, MD   5 mg at 06/16/14 0841  . polyethylene glycol (MIRALAX / GLYCOLAX) packet 17 g  17 g Oral Daily PRN Ursula Alert, MD      . temazepam (RESTORIL) capsule 30 mg  30 mg Oral QHS Ursula Alert, MD   30 mg at 06/15/14 2123    Lab Results:  Results for orders placed or performed during the hospital encounter of 06/09/14 (from the past 48 hour(s))  Glucose, capillary     Status: None   Collection Time: 06/14/14  4:42 PM  Result Value Ref Range   Glucose-Capillary 83 70 - 99 mg/dL   Comment 1 Notify RN   Glucose, capillary     Status: None   Collection Time: 06/15/14  6:12 AM  Result Value Ref Range   Glucose-Capillary 85 70 - 99 mg/dL  Glucose, capillary     Status: None   Collection Time: 06/15/14  5:01 PM  Result Value Ref Range   Glucose-Capillary 92 70 - 99 mg/dL  Glucose, capillary     Status: None   Collection Time: 06/16/14  5:58 AM  Result Value Ref Range   Glucose-Capillary 97 70 - 99 mg/dL    Physical Findings: AIMS: Facial and Oral Movements Muscles of Facial Expression: None, normal Lips and Perioral Area: None, normal Jaw: None, normal Tongue: None, normal,Extremity Movements Upper (arms, wrists, hands, fingers): None, normal Lower (legs, knees, ankles, toes): None, normal, Trunk Movements Neck, shoulders, hips: None, normal, Overall Severity Severity of abnormal movements (highest score from questions above): None,  normal Incapacitation due to abnormal movements: None, normal Patient's awareness of abnormal movements (rate only patient's report): No Awareness, Dental Status Current problems with teeth and/or dentures?: No Does patient usually wear dentures?: No  CIWA:  CIWA-Ar Total: 1 COWS:  COWS Total Score: 1   Assessment: Patient is reported as intermittently loud, irritable, intrusive. Today she presents as sad, and focused on recent stressors , particularly that of her boyfriend's recent incarceration. She is not suicidal and is future oriented, and is focusing on disposition options.  At this time is not endorsing medication side effects.   Treatment Plan Summary: Daily contact with patient to assess and evaluate symptoms and progress in treatment Medication management  Plan:  Continue inpatient treatment and support   Zyprexa zydis 10 mg QHS Cogentin 0.5 mg BID Neurontin 100 mgrs QHS Lamictal  25 mg BID Temazepam 30 mgrs QHS     Medical Decision Making Problem Points:  Established problem, stable/improving (1), Review of last therapy session (1) and Review of psycho-social stressors (1) Data Points:  Review of medication regiment & side effects (2)  I certify that inpatient services furnished can reasonably be expected to improve the patient's condition.   Neita Garnet MD 06/16/2014, 3:51 PM

## 2014-06-16 NOTE — Progress Notes (Signed)
The focus of this group is to help patients review their daily goal of treatment and discuss progress on daily workbooks. Pt attended the evening group session and responded to all discussion prompts from the [place]. Pt told the group today was a good day because she kept her anger in check and didn't hurt anyone. Pt expressed this, however, using foul and inflammatory language directed at selective peers and required redirection several times to remain respectful in her comments. [name] told the group she felt like several patients on the hallway "had her back" if they saw her sad or upset and would likely try and bring her out of whatever might be upsetting her. "I know the staff has my back too." Pt's affect was appropriate.

## 2014-06-17 DIAGNOSIS — F1220 Cannabis dependence, uncomplicated: Secondary | ICD-10-CM

## 2014-06-17 LAB — GLUCOSE, CAPILLARY: GLUCOSE-CAPILLARY: 90 mg/dL (ref 70–99)

## 2014-06-17 MED ORDER — LAMOTRIGINE 25 MG PO TABS
50.0000 mg | ORAL_TABLET | Freq: Two times a day (BID) | ORAL | Status: DC
Start: 2014-06-17 — End: 2014-06-18
  Administered 2014-06-17 – 2014-06-18 (×2): 50 mg via ORAL
  Filled 2014-06-17: qty 2
  Filled 2014-06-17: qty 40
  Filled 2014-06-17: qty 2
  Filled 2014-06-17: qty 40
  Filled 2014-06-17: qty 2
  Filled 2014-06-17 (×2): qty 40
  Filled 2014-06-17: qty 2

## 2014-06-17 MED ORDER — OLANZAPINE 10 MG PO TBDP
10.0000 mg | ORAL_TABLET | Freq: Every day | ORAL | Status: DC
  Administered 2014-06-18: 10 mg via ORAL
  Filled 2014-06-17: qty 10
  Filled 2014-06-17 (×2): qty 1
  Filled 2014-06-17: qty 10

## 2014-06-17 NOTE — Progress Notes (Signed)
PHYSICAL THERAPY  NOTE-Observed patient ambulating with single point cane in the room. Patient appreciative  Of recommendation for cane. MD-PLEASE ORDER SINGLE POINT CANE AT DISCHARGE. (may order from [place] possibly). PT will sign off.Lovett Coffin PT [phone]
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
## 2014-06-17 NOTE — Progress Notes (Signed)
Adult Psychoeducational Group Note  Date:  06/17/2014 Time:  11:29 PM  Group Topic/Focus:  Wrap-Up Group:   The focus of this group is to help patients review their daily goal of treatment and discuss progress on daily workbooks.  Participation Level:  Active  Participation Quality:  Monopolizing  Affect:  Labile  Cognitive:  Oriented  Insight: Appropriate  Engagement in Group:  Engaged  Modes of Intervention:  Socialization and Support  Additional Comments:  Patient attended and participated in group tonight. She reported that the day started off OK,  Then went down hill a little as the day go by. The patient advised that she dont think she is getting real therapy here. She stated that she did went to her groups and went for her meals.  Salley Scarlet [facility] 06/17/2014, 11:29 PM

## 2014-06-17 NOTE — BHH Group Notes (Signed)
[facility] LCSW Group Therapy  06/17/2014 1:54 PM   Emotion Regulation: This group focused on both positive and negative emotion identification and allowed group members to process ways to identify feelings, regulate negative emotions, and find healthy ways to manage internal/external emotions. Group members were asked to reflect on a time when their reaction to an emotion led to a negative outcome and explored how alternative responses using emotion regulation would have benefited them. Group members were also asked to discuss a time when emotion regulation was utilized when a negative emotion was experienced.   Invited to group, did not attend.   Beverely Pace 06/17/2014, 1:54 PM
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
## 2014-06-17 NOTE — Clinical Social Work Note (Signed)
CSW met with pt individually to discuss aftercare plan and provide her with list of resources (shelters/IRC/food pantries/[employer]). Pt became upset when told that she would be d/cing Wed. Pt stated that she has nowhere to go, yet was not open to going to shelter outside of [facility]. Pt asked CSW to contact her mother and see if she can stay there and if not, to ask her mother to contact "Miss [facility]" in order to see if she can stay with her. CSW left message for pt's mother requesting call back at earliest convenience.  [place], LCSWA 06/17/2014 2:16 PM

## 2014-06-17 NOTE — Clinical Social Work Note (Signed)
CSW attempted to meet with pt this morning to discuss likely d/c tomorrow (per Dr. Shea Evans). Pt sleeping in room/did not wake up with prompting. CSW to provide pt with [facility] list, shelter list, [facility] info, and [employer] pamphlet today. Pt to follow-up at [facility] for med management, therapy, and support groups.   [place], [place]  06/17/2014 10:03 AM

## 2014-06-18 DIAGNOSIS — F1220 Cannabis dependence, uncomplicated: Secondary | ICD-10-CM | POA: Insufficient documentation

## 2014-06-18 MED ORDER — GABAPENTIN 100 MG PO CAPS: 100.0000 mg | ORAL_CAPSULE | Freq: Every day | ORAL | Status: DC

## 2014-06-18 MED ORDER — TEMAZEPAM 30 MG PO CAPS: 30.0000 mg | ORAL_CAPSULE | Freq: Every day | ORAL | Status: DC

## 2014-06-18 MED ORDER — HYDROXYZINE PAMOATE 25 MG PO CAPS: 25.0000 mg | ORAL_CAPSULE | Freq: Two times a day (BID) | ORAL | Status: DC | PRN

## 2014-06-18 MED ORDER — LAMOTRIGINE 25 MG PO TABS: 50.0000 mg | ORAL_TABLET | Freq: Two times a day (BID) | ORAL | Status: DC

## 2014-06-18 MED ORDER — OLANZAPINE 10 MG PO TBDP: 10.0000 mg | ORAL_TABLET | Freq: Two times a day (BID) | ORAL | Status: DC

## 2014-06-18 MED ORDER — BENZTROPINE MESYLATE 0.5 MG PO TABS: 0.5000 mg | ORAL_TABLET | ORAL | Status: AC

## 2014-06-18 MED ORDER — NAPROXEN 500 MG PO TABS: 500.0000 mg | ORAL_TABLET | Freq: Two times a day (BID) | ORAL | Status: DC

## 2014-06-18 MED ORDER — POLYETHYLENE GLYCOL 3350 17 G PO PACK: 17.0000 g | PACK | Freq: Two times a day (BID) | ORAL | Status: DC

## 2014-06-18 MED ORDER — NITROFURANTOIN MACROCRYSTAL 100 MG PO CAPS: 100.0000 mg | ORAL_CAPSULE | Freq: Two times a day (BID) | ORAL | Status: DC

## 2014-06-18 NOTE — Progress Notes (Signed)
D. Pt had been up and visible in milieu this evening, attended and participated in evening group session. Pt reports on-going anxiety and did appear anxious and restless in the milieu this evening, did endorse auditory hallucinations but reports improvement since admission and did awake already this evening and had complaints of not being able to stay asleep this evening and reported anxiety associated with this. A. Pt provided with PRN dose of vistaril to help with anxiety, support provided. R. Safety maintained, will continue to monitor.

## 2014-06-18 NOTE — Clinical Social Work Note (Signed)
CSW contacted multiple shelters this morning to check bed availability: [facility] [phone]: left message for Terance Ice to call back; [facility] [phone]: no beds available; [facility] [phone]: no beds available; [facility] [phone]: left message requesting call back.   [place], [place] 06/18/2014 8:39 AM
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
## 2014-06-18 NOTE — [facility] Suicide Risk Assessment (Signed)
   Demographic Factors:  Low socioeconomic status and Unemployed  Total Time spent with patient: 45 minutes  Psychiatric Specialty Exam: Physical Exam  ROS  Blood pressure 128/57, pulse 90, temperature 98.3 F (36.8 C), temperature source Oral, resp. rate 16, height 5\' 2"  (1.575 m), weight 86.183 kg (190 lb), last menstrual period 06/03/2014.Body mass index is 34.74 kg/(m^2).  General Appearance: Casual  Eye Contact::  Good  Speech:  Clear and Coherent  Volume:  Normal  Mood:  Euthymic  Affect:  Appropriate  Thought Process:  Goal Directed  Orientation:  Full (Time, Place, and Person)  Thought Content:  WDL  Suicidal Thoughts:  No  Homicidal Thoughts:  No  Memory:  Immediate;   Good Recent;   Good Remote;   Good  Judgement:  Fair  Insight:  Fair  Psychomotor Activity:  Normal  Concentration:  Fair  Recall:  [place] of Knowledge:Fair  Language: Good  Akathisia:  No  Handed:  Right  AIMS (if indicated):     Assets:  Communication Skills Desire for Improvement  Sleep:  Number of Hours: 6.25    Musculoskeletal: Strength & Muscle Tone: within normal limits Gait & Station: normal Patient leans: N/A   Mental Status Per Nursing Assessment::   On Admission:     Current Mental Status by Physician: Patient initially was upset that she was going to be discharged. Pt became loud ,started yelling and also reported "I am going to go out and run in front of a car" . However later on pt calmed down and came up to Probation officer and apologized ,said "I have anger issues that I have been dealing with for quiet so long, I really did not mean it ,My mom just called and said I can go and stay with my step mother." Pt discussed different options if she were faced with situations that are uncomfortable to her after discharge like "her family drinking ETOH while around her and sos on ". Pt to call mobile crisis numbers or outpatient provider or 911 if she feels she is at risk.  Loss  Factors: Financial problems/change in socioeconomic status  Historical Factors: Impulsivity  Risk Reduction Factors:   Positive social support  Continued Clinical Symptoms:  Previous Psychiatric Diagnoses and Treatments  Cognitive Features That Contribute To Risk:  Polarized thinking    Suicide Risk:  Minimal: No identifiable suicidal ideation.    Discharge Diagnoses:  DSM5: Primary Psychiatric Diagnosis: Bipolar disorder ,type ,major depressive episode ,severe with psychotic features (improving)   Secondary Psychiatric Diagnosis: Cannabis use disorder,severe Cocaine use disorder per hx   Non Psychiatric Diagnosis: UTI  Past Medical History  Diagnosis Date  . Hepatitis   . Hypertension   . Anxiety   . Depression     Plan Of Care/Follow-up recommendations:  Activity:  regular Diet:  regular  Is patient on multiple antipsychotic therapies at discharge:  No   Has Patient had three or more failed trials of antipsychotic monotherapy by history:  No  Recommended Plan for Multiple Antipsychotic Therapies: NA    Porshe Fleagle MD 06/18/2014, 10:13 AM

## 2014-06-18 NOTE — Progress Notes (Signed)
D: Patient is alert and oriented. Pt's mood and affect is irritable, angry, and verbally aggressive. Pt reports her depression and hopelessness 8/10, and anxiety 10/10. Pt states her goal for the day is "being patient with myself and others" and "talk it out with counselor." Pt reports that she is having suicidal thoughts with no plan. Pt began expressing suicidal thoughts when she was told that she is D/Cing today but has denied SI for several days previous to D/C information. Pt has become increasingly agitated and verbally raises voice when expressing feelings of having to leave the hospital. A: Support/Encouragement provided to pt. Pt refused PRN medications. Scheduled medications administered per providers orders (See MAR). 15 minute checks completed per protocol for pt safety. R: Pt receptive to nursing interventions.

## 2014-06-18 NOTE — Progress Notes (Signed)
[facility] Adult Case Management Discharge Plan :  Will you be returning to the same living situation after discharge: Yes,  no. pt going to live with her mother at d/c At discharge, do you have transportation home?:Yes,  pt's mother coming at 11:30AM Do you have the ability to pay for your medications: Yes, mental health  Release of information consent forms completed and submitted to medical records by CSW.  Patient to Follow up at: Follow-up Information    Follow up with [facility].   Why:  Appts needed prior to d/c. current appt cancelled. left message for Langley Gauss 06/17/14 10:10AM to schedule appts and requested call back.    Contact information:   [address] [place], [place] [address] Phone: [phone] Fax: [phone]      Patient denies SI/HI:   Yes,  during self report    Safety Planning and Suicide Prevention discussed:  Yes,  SPE completed with pt's boyfriend. SPI pamphlet provided to pt including mobile crisis number/info. Pt encouraged to share information with support network, ask questions, and talk about concerns relating to SPE.  Smart, Danile Trier LCSWA 06/18/2014, 10:08 AM

## 2014-06-18 NOTE — Tx Team (Signed)
Interdisciplinary Treatment Plan Update (Adult)   Date: 06/18/2014   Time Reviewed: 9:00AM Progress in Treatment:  Attending groups: Intermittently  Participating in groups:  Minimally, when she attends.  Taking medication as prescribed: Yes  Tolerating medication: Yes  Family/Significant othe contact made: SPE completed with pt's bf.  Patient understands diagnosis: Yes, AEB seeking treatment for SI with plan and recent attempt to OD, depression/mood stabilization, AVH, HI "toward people from the past", and for medication stabilization.  Discussing patient identified problems/goals with staff: Yes  Medical problems stabilized or resolved: Yes  Denies suicidal/homicidal ideation: No SI/Hi reported after being accepted Patient has not harmed self or Others: Yes  New problem(s) identified:  Discharge Plan or Barriers: CSW assessing for appropriate referrals at this time. Pt in bed this morning. CSW will attempt to meet with her to complete PSA/discuss aftercare this afternoon.  Additional comments:This is a 43 year old African-American female. Admitted to [facility] from the [facility] with complaints of suicidal ideations and increased depression. Patient reports, "The cops took me to the hospital early Friday morning. I attempted to jump off of a tall building. I am stressed and overwhelmed. I have a lot of personal/familial issues. I'm tired of stressing. I can't handle it any more. My mother and father don't want to help me or do nothing for me. I want them to love me like their child. I cry a lot. All I have been doing is cry, cry and cry. Then I drink me some beer, smoke me some weed to mellow me out. The beer and weed helps me sleep and relax. I have been trying to my get my disability, but they keep me giving me the run around. I have been depressed since my grandmother died in 2002/11/12. I started smoking weed and drinking alcohol when I was just a child. I need to get on my medicines. I have  Bipolar disorder. I take Seroquel, Risperdal".  11/20: Pt has had a hard time today. Sad, tearful and feeling like " everybody I love leaves me", She has spent some of her day, lying in her bed crying, but she has amnaged to get up and come to the dayroom occasionally. She is compliant with her medicaitons and takes them as scheduled. She is started on macrodantin for a uti and is educated by this nurse to increase her intake of water and cranberrya nd acidic juices and hold intake of caffeine and carbonated drinks. She completed her morning assessment sheet and on it she wrote she denied SI within the past 24 hrs, she rated her depression, hopelessness and anxiety "11/26/08", respectively and she is unclear what her DC planning will be at this point.  Reason for Continuation of Hospitalization: none  Estimated length of stay: d/c today For review of initial/current patient goals, please see plan of care.  Attendees:  Patient:    Family:    Physician: Dr. Shea Evans, MD 06/18/2014   Nursing: Deatra Ina RN  06/18/2014   Clinical Social Worker [place], [place]  06/18/2014   Other: Roque Lias, LCSW 06/18/2014   Other: Edwyna Shell, LCSW 06/18/2014   Other: Langley Coordinator  06/18/2014   Other:  06/18/2014   Scribe for Treatment Team:  Nira Conn Smart LCSWA 06/18/2014 9:00AM

## 2014-06-18 NOTE — Progress Notes (Signed)
DISCHARGE NOTE: D: Patient was alert and oriented upon discharge; ambulatory with a steady gait.  A: AVS reviewed and a copy was given to pt. Medications/Prescriptions given to pt. Follow up reviewed. Resources reviewed, including [facility]. Pt was given then opportunity to ask questions and express concerns. Belongings returned to pt. R: Pt D/C'd to family.

## 2014-06-23 NOTE — Progress Notes (Signed)
Patient Discharge Instructions:  After Visit Summary (AVS):   Faxed to:  06/23/14 Discharge Summary Note:   Faxed to:  06/23/14 Psychiatric Admission Assessment Note:   Faxed to:  06/23/14 Suicide Risk Assessment - Discharge Assessment:   Faxed to:  06/23/14 Faxed/Sent to the Next Level Care provider:  06/23/14 Faxed to [facility] [place], 06/23/2014, 3:08 PM

## 2014-12-27 ENCOUNTER — Emergency Department (HOSPITAL_COMMUNITY)
Admission: EM | Admit: 2014-12-27 | Discharge: 2014-12-27 | Disposition: A | Discharge status: Home / Self Care | Payer: No Typology Code available for payment source | Attending: Emergency Medicine | Admitting: Emergency Medicine

## 2014-12-27 ENCOUNTER — Emergency Department (HOSPITAL_COMMUNITY)
Admission: EM | Admit: 2014-12-27 | Discharge: 2014-12-27 | Discharge status: Against Medical Advice | Payer: No Typology Code available for payment source

## 2014-12-27 ENCOUNTER — Encounter (HOSPITAL_COMMUNITY): Payer: Self-pay

## 2014-12-27 ENCOUNTER — Emergency Department (HOSPITAL_COMMUNITY): Payer: No Typology Code available for payment source

## 2014-12-27 DIAGNOSIS — S93402A Sprain of unspecified ligament of left ankle, initial encounter: Secondary | ICD-10-CM | POA: Diagnosis not present

## 2014-12-27 DIAGNOSIS — Z79899 Other long term (current) drug therapy: Secondary | ICD-10-CM | POA: Insufficient documentation

## 2014-12-27 DIAGNOSIS — F419 Anxiety disorder, unspecified: Secondary | ICD-10-CM | POA: Diagnosis not present

## 2014-12-27 DIAGNOSIS — Y9389 Activity, other specified: Secondary | ICD-10-CM | POA: Insufficient documentation

## 2014-12-27 DIAGNOSIS — Z8619 Personal history of other infectious and parasitic diseases: Secondary | ICD-10-CM | POA: Diagnosis not present

## 2014-12-27 DIAGNOSIS — I10 Essential (primary) hypertension: Secondary | ICD-10-CM | POA: Diagnosis not present

## 2014-12-27 DIAGNOSIS — S99922A Unspecified injury of left foot, initial encounter: Secondary | ICD-10-CM | POA: Diagnosis present

## 2014-12-27 DIAGNOSIS — W01198A Fall on same level from slipping, tripping and stumbling with subsequent striking against other object, initial encounter: Secondary | ICD-10-CM | POA: Diagnosis not present

## 2014-12-27 DIAGNOSIS — Z720 Tobacco use: Secondary | ICD-10-CM | POA: Insufficient documentation

## 2014-12-27 DIAGNOSIS — Y9289 Other specified places as the place of occurrence of the external cause: Secondary | ICD-10-CM | POA: Diagnosis not present

## 2014-12-27 DIAGNOSIS — Y998 Other external cause status: Secondary | ICD-10-CM | POA: Insufficient documentation

## 2014-12-27 DIAGNOSIS — F329 Major depressive disorder, single episode, unspecified: Secondary | ICD-10-CM | POA: Insufficient documentation

## 2014-12-27 DIAGNOSIS — W19XXXA Unspecified fall, initial encounter: Secondary | ICD-10-CM

## 2014-12-27 MED ORDER — HYDROCODONE-ACETAMINOPHEN 5-325 MG PO TABS
2.0000 | ORAL_TABLET | Freq: Once | ORAL | Status: AC
  Administered 2014-12-27: 2 via ORAL
  Filled 2014-12-27: qty 2

## 2014-12-27 MED ORDER — IBUPROFEN 800 MG PO TABS: 800.0000 mg | ORAL_TABLET | Freq: Three times a day (TID) | ORAL | Status: DC

## 2014-12-27 NOTE — Discharge Instructions (Signed)

## 2014-12-27 NOTE — Progress Notes (Signed)
Orthopedic Tech Progress Note Patient Details:  [name] 09-15-1970 [id number] Applied ASO to LLE.  Pulses, sensation, motion intact before and after application.  Capillary refill less than 2 seconds before and after application.  Fit pt. for crutches and taught use of same. Ortho Devices Type of Ortho Device: ASO, Crutches Ortho Device/Splint Location: LLE Ortho Device/Splint Interventions: Application   Darrol Poke 12/27/2014, 9:42 PM

## 2014-12-27 NOTE — ED Provider Notes (Signed)
CSN: [id number]     Arrival date & time 12/27/14  1847 History  This chart was scribed for Dahlia Bailiff, PA-C working with No att. providers found by Mercy Moore, ED Scribe. This patient was seen in room TR08C/TR08C and the patient's care was started at 7:58 PM.   Chief Complaint  Patient presents with  . Foot Injury   The history is provided by the patient. No language interpreter was used.   HPI Comments: [name] is a 44 y.o. female who presents to the Emergency Department with left ankle injury incurred at work today. Patient reports stepping from a curb and inverting her left ankle. Paitent reports swelling to lateral aspect and pain with bearing weight and ambulation. Patient reports severe pain in her ankle, shooting pain in her foot and with pins and needles pain. Patient denies numbness, tingling, loss of sensation or function.  Past Medical History  Diagnosis Date  . Hepatitis   . Hypertension   . Anxiety   . Depression    History reviewed. No pertinent past surgical history. Family History  Problem Relation Age of Onset  . Depression Other    History  Substance Use Topics  . Smoking status: Current Every Day Smoker -- 0.50 packs/day    Types: Cigarettes  . Smokeless tobacco: Never Used  . Alcohol Use: Yes     Comment: occ    OB History    Gravida Para Term Preterm AB TAB SAB Ectopic Multiple Living   4 3 3  1   0 1  2     Review of Systems  Constitutional: Negative for fever and chills.  Musculoskeletal: Positive for joint swelling and arthralgias.  Neurological: Negative for weakness and numbness.    Allergies  Review of patient's allergies indicates no known allergies.  Home Medications   Prior to Admission medications   Medication Sig Start Date End Date Taking? Authorizing Provider  benztropine (COGENTIN) 0.5 MG tablet Take 1 tablet (0.5 mg total) by mouth 2 (two) times daily in the am and at bedtime.[name] 06/18/14   Trula Ore, NP  gabapentin  (NEURONTIN) 100 MG capsule Take 1 capsule (100 mg total) by mouth at bedtime. 06/18/14   Trula Ore, NP  hydrOXYzine (VISTARIL) 25 MG capsule Take 1 capsule (25 mg total) by mouth 2 (two) times daily as needed for anxiety. 06/18/14   Trula Ore, NP  ibuprofen (ADVIL,MOTRIN) 800 MG tablet Take 1 tablet (800 mg total) by mouth 3 (three) times daily. 12/27/14   Dahlia Bailiff, PA-C  lamoTRIgine (LAMICTAL) 25 MG tablet Take 2 tablets (50 mg total) by mouth 2 (two) times daily. 06/18/14   Trula Ore, NP  naproxen (NAPROSYN) 500 MG tablet Take 1 tablet (500 mg total) by mouth 2 (two) times daily with a meal. 06/18/14   Trula Ore, NP  nitrofurantoin (MACRODANTIN) 100 MG capsule Take 1 capsule (100 mg total) by mouth every 12 (twelve) hours. 06/18/14   Trula Ore, NP  OLANZapine zydis (ZYPREXA) 10 MG disintegrating tablet Take 1 tablet (10 mg total) by mouth 2 (two) times daily at 8 am and 10 pm. 06/18/14   Trula Ore, NP  polyethylene glycol (MIRALAX / GLYCOLAX) packet Take 17 g by mouth 2 (two) times daily. 06/18/14   Trula Ore, NP  temazepam (RESTORIL) 30 MG capsule Take 1 capsule (30 mg total) by mouth at bedtime. 06/18/14   Trula Ore, NP   Triage Vitals: BP  117/66 mmHg  Pulse 94  Temp(Src) 98.2 F (36.8 C) (Oral)  Resp 18  SpO2 100%  LMP 12/27/2014 Physical Exam  Constitutional: She is oriented to person, place, and time. She appears well-developed and well-nourished. No distress.  HENT:  Head: Normocephalic and atraumatic.  Eyes: EOM are normal.  Neck: Neck supple. No tracheal deviation present.  Cardiovascular: Normal rate.   Pulmonary/Chest: Effort normal. No respiratory distress.  Musculoskeletal: Normal range of motion.  Mild swelling in her lateral malleolus. Pain with ROM. No frank instability DP pulses 2+. Cap refill < 2 seconds. Distal pulses intact.   Neurological: She is alert and oriented to person, place, and time.  Skin: Skin is warm  and dry.  Psychiatric: She has a normal mood and affect. Her behavior is normal.  Nursing note and vitals reviewed.   ED Course  Procedures (including critical care time)  COORDINATION OF CARE: 8:02 PM- Discussed treatment plan with patient at bedside and patient agreed to plan.   Labs Review Labs Reviewed - No data to display  Imaging Review Dg Ankle Complete Left  12/27/2014   CLINICAL DATA:  Left foot and ankle pain after injury. Stepped off a curb at work, inverting left ankle.  EXAM: LEFT ANKLE COMPLETE - 3+ VIEW  COMPARISON:  None.  FINDINGS: No fracture or dislocation. The alignment and joint spaces are maintained. The ankle mortise is preserved. There is diffuse soft tissue edema.  IMPRESSION: Soft tissue edema without acute fracture or dislocation.   Electronically Signed   By: Jeb Levering M.D.   On: 12/27/2014 21:12   Dg Foot Complete Left  12/27/2014   CLINICAL DATA:  Left foot and ankle pain after injury. Stepped off a curb at work, inversion injury.  EXAM: LEFT FOOT - COMPLETE 3+ VIEW  COMPARISON:  None.  FINDINGS: No fracture or dislocation. The alignment and joint spaces are maintained. Incidental note of a bipartite tibial sesamoid. Mild soft tissue edema, no focal soft tissue abnormality.  IMPRESSION: Soft tissue edema, no fracture or dislocation.   Electronically Signed   By: Jeb Levering M.D.   On: 12/27/2014 21:14     EKG Interpretation None      MDM   Final diagnoses:  Fall  Ankle sprain, left, initial encounter    Patient here with signs and symptoms consistent with ankle sprain. Radiographs unremarkable for acute pathology. Patient placed in an ASO brace, given crutches. Patient neurovascularly intact. No frank instability on exam. Patient stable for discharge, strongly encouraged follow-up with orthopedics, return precautions discussed, patient verbalizes understanding and agreement of this plan.  I personally performed the services described in this  documentation, which was scribed in my presence. The recorded information has been reviewed and is accurate.  BP 129/77 mmHg  Pulse 83  Temp(Src) 98.3 F (36.8 C) (Oral)  Resp 18  SpO2 100%  LMP 12/27/2014  Signed,  Dahlia Bailiff, PA-C 1:05 AM    Dahlia Bailiff, PA-C 12/28/14 [id number]  Ezequiel Essex, MD 12/28/14 [id number]

## 2014-12-27 NOTE — ED Notes (Signed)
Pt seen by RN walking to waiting area; pt asked RN earlier about going to smoke; pt told she may not leave tx area and return bc she will be subject to have to recheck in; Pt returned in 5 mins; Ortho waiting at bedside for pt to return

## 2014-12-27 NOTE — ED Notes (Signed)
Pt reports twisted left ankle today, unable to bear weight.

## 2014-12-27 NOTE — ED Notes (Signed)
Patient transported to X-ray 

## 2015-08-16 IMAGING — DX DG FOOT COMPLETE 3+V*L*
3 series · 3 of 3 positions shown · non-contrast
Comparison: None.

CLINICAL DATA: Left foot and ankle pain after injury. Stepped off a
curb at work, inversion injury.

EXAM:
LEFT FOOT - COMPLETE 3+ VIEW

[foot ap]
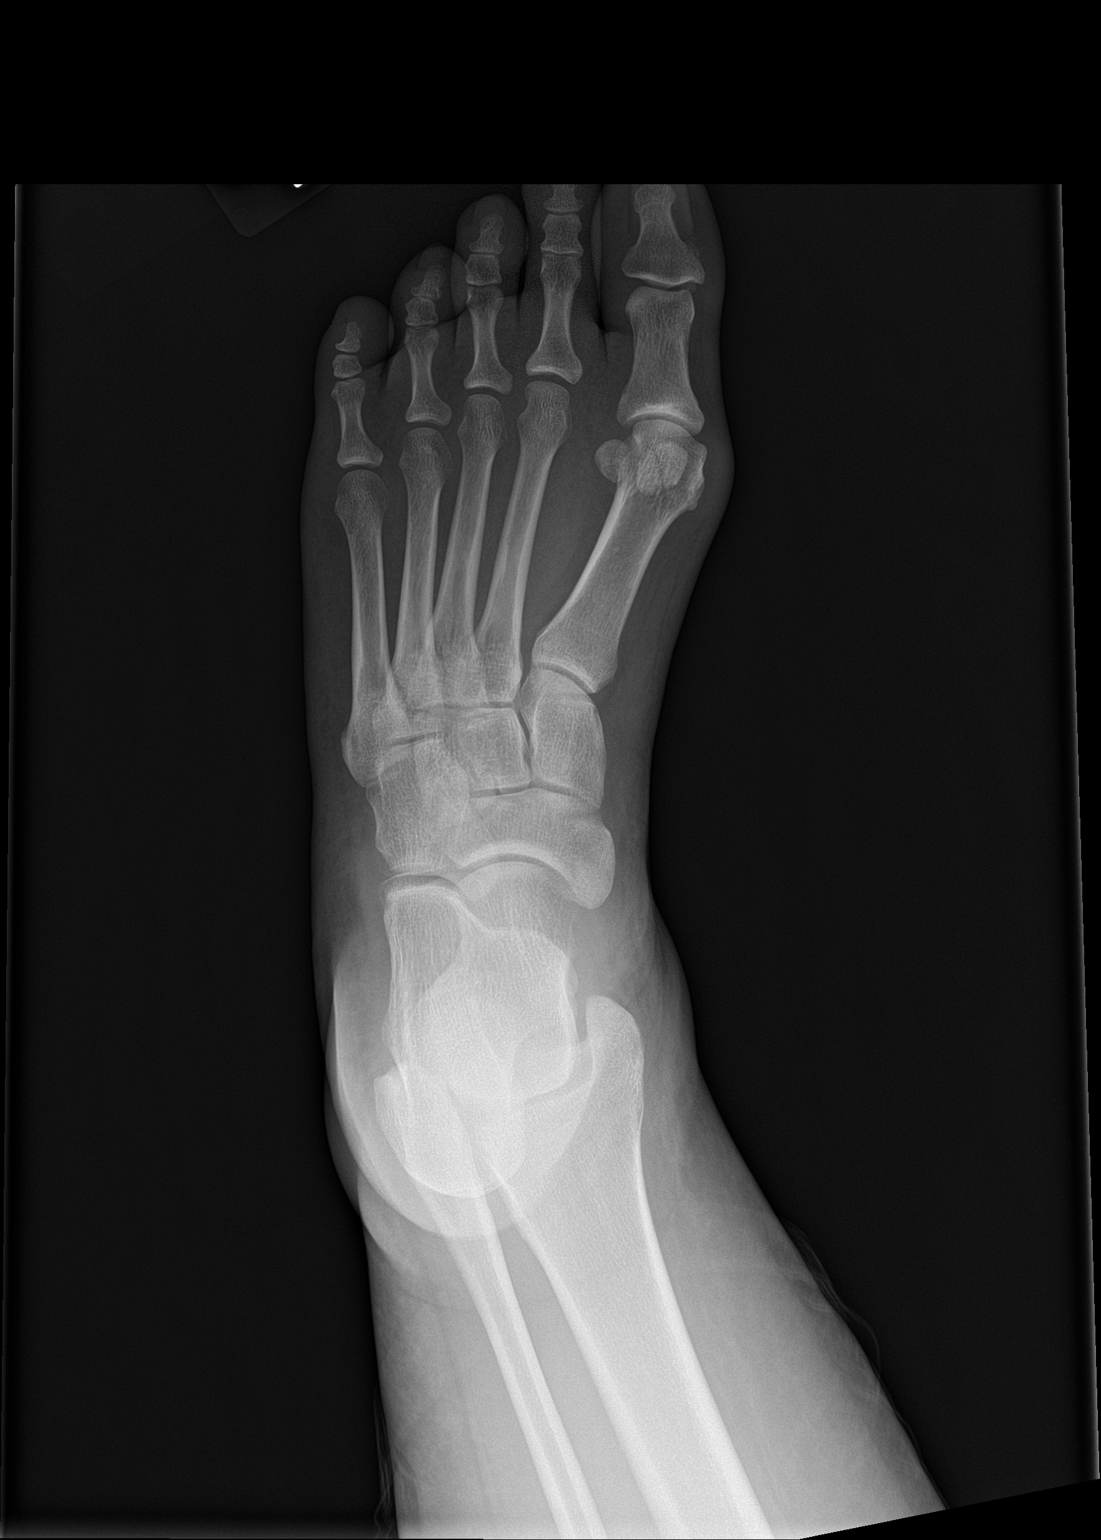

[foot obl]
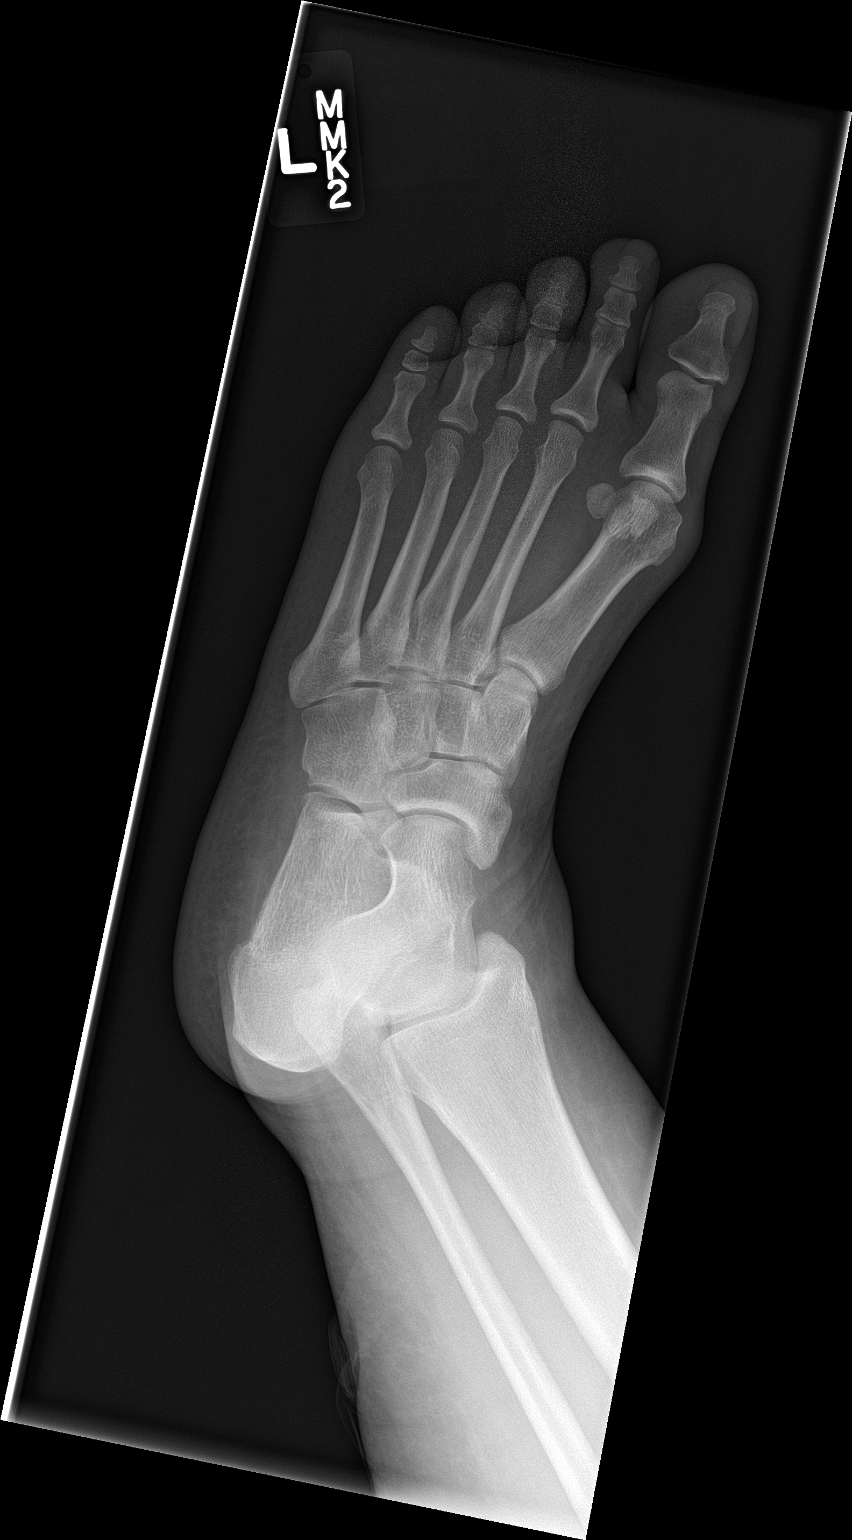

[foot lat]
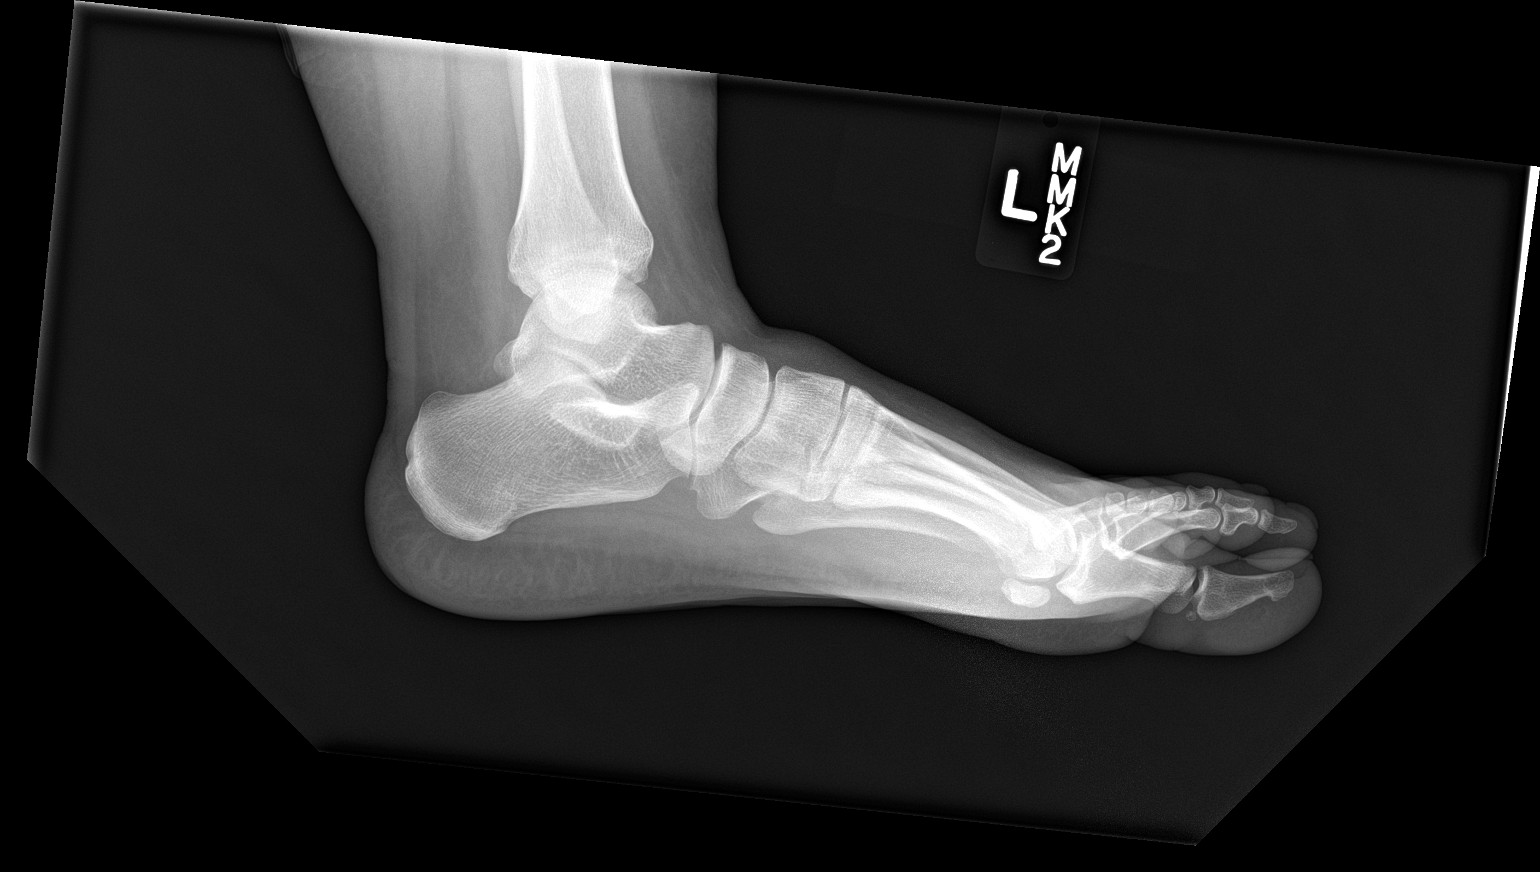

[3 of 3 positions shown; findings below may reference images not displayed]

FINDINGS: No fracture or dislocation. The alignment and joint spaces are
maintained. Incidental note of a bipartite tibial sesamoid. Mild
soft tissue edema, no focal soft tissue abnormality.
IMPRESSION: Soft tissue edema, no fracture or dislocation.

## 2017-11-15 ENCOUNTER — Other Ambulatory Visit: Payer: Self-pay | Admitting: *Deleted

## 2017-11-15 DIAGNOSIS — Z1231 Encounter for screening mammogram for malignant neoplasm of breast: Secondary | ICD-10-CM

## 2018-03-13 ENCOUNTER — Other Ambulatory Visit: Payer: Self-pay | Admitting: *Deleted

## 2018-03-13 DIAGNOSIS — Z1231 Encounter for screening mammogram for malignant neoplasm of breast: Secondary | ICD-10-CM

## 2018-04-07 ENCOUNTER — Emergency Department (HOSPITAL_COMMUNITY)
Admission: EM | Admit: 2018-04-07 | Discharge: 2018-04-07 | Disposition: A | Discharge status: Home / Self Care | Payer: No Typology Code available for payment source | Attending: Emergency Medicine | Admitting: Emergency Medicine

## 2018-04-07 ENCOUNTER — Encounter (HOSPITAL_COMMUNITY): Payer: Self-pay

## 2018-04-07 ENCOUNTER — Other Ambulatory Visit: Payer: Self-pay

## 2018-04-07 ENCOUNTER — Emergency Department (HOSPITAL_COMMUNITY): Payer: Self-pay

## 2018-04-07 DIAGNOSIS — I10 Essential (primary) hypertension: Secondary | ICD-10-CM | POA: Insufficient documentation

## 2018-04-07 DIAGNOSIS — Z79899 Other long term (current) drug therapy: Secondary | ICD-10-CM | POA: Insufficient documentation

## 2018-04-07 DIAGNOSIS — R072 Precordial pain: Secondary | ICD-10-CM | POA: Insufficient documentation

## 2018-04-07 DIAGNOSIS — F17210 Nicotine dependence, cigarettes, uncomplicated: Secondary | ICD-10-CM | POA: Insufficient documentation

## 2018-04-07 DIAGNOSIS — R079 Chest pain, unspecified: Secondary | ICD-10-CM

## 2018-04-07 LAB — CBC
HCT: 43.4 % (ref 36.0–46.0)
HEMOGLOBIN: 14.3 g/dL (ref 12.0–15.0)
MCH: 34.4 pg — ABNORMAL HIGH (ref 26.0–34.0)
MCHC: 32.9 g/dL (ref 30.0–36.0)
MCV: 104.3 fL — ABNORMAL HIGH (ref 78.0–100.0)
Platelets: 172 10*3/uL (ref 150–400)
RBC: 4.16 MIL/uL (ref 3.87–5.11)
RDW: 12.9 % (ref 11.5–15.5)
WBC: 10.4 10*3/uL (ref 4.0–10.5)

## 2018-04-07 LAB — BASIC METABOLIC PANEL
Anion gap: 10 (ref 5–15)
BUN: <5 mg/dL — ABNORMAL LOW (ref 6–20)
CALCIUM: 9.2 mg/dL (ref 8.9–10.3)
CO2: 23 mmol/L (ref 22–32)
Chloride: 107 mmol/L (ref 98–111)
Creatinine, Ser: 0.95 mg/dL (ref 0.44–1.00)
GLUCOSE: 109 mg/dL — AB (ref 70–99)
Potassium: 3.6 mmol/L (ref 3.5–5.1)
SODIUM: 140 mmol/L (ref 135–145)

## 2018-04-07 LAB — I-STAT TROPONIN, ED
TROPONIN I, POC: 0 ng/mL (ref 0.00–0.08)
TROPONIN I, POC: 0 ng/mL (ref 0.00–0.08)

## 2018-04-07 LAB — I-STAT BETA HCG BLOOD, ED (MC, WL, AP ONLY): I-stat hCG, quantitative: <5 m[IU]/mL (ref <5)

## 2018-04-07 MED ORDER — LORAZEPAM 1 MG PO TABS
0.5000 mg | ORAL_TABLET | Freq: Once | ORAL | Status: AC
  Administered 2018-04-07: 0.5 mg via ORAL
  Filled 2018-04-07: qty 1

## 2018-04-07 MED ORDER — LACTATED RINGERS IV BOLUS
1000.0000 mL | Freq: Once | INTRAVENOUS | Status: AC
  Administered 2018-04-07: 1000 mL via INTRAVENOUS

## 2018-04-07 NOTE — ED Provider Notes (Signed)
[facility] EMERGENCY DEPARTMENT Provider Note   CSN: [id number] Arrival date & time: 04/07/18  1017     History   Chief Complaint Chief Complaint  Patient presents with  . Chest Pain    HPI [name] is a 47 y.o. female.  The history is provided by the patient.  Chest Pain   This is a new problem. The current episode started 6 to 12 hours ago. The problem occurs constantly. The problem has been gradually improving. Associated with: cocaine binge last night. The pain is present in the substernal region. The pain is at a severity of 3/10. The pain is mild. The quality of the pain is described as pressure-like. The pain does not radiate. Pertinent negatives include no abdominal pain, no back pain, no cough, no fever, no palpitations, no shortness of breath and no vomiting. She has tried rest for the symptoms. The treatment provided no relief.  Her past medical history is significant for hypertension.  Pertinent negatives for past medical history include no seizures.    Past Medical History:  Diagnosis Date  . Anxiety   . Depression   . Hepatitis   . Hypertension     Patient Active Problem List   Diagnosis Date Noted  . Cannabis use disorder, severe, dependence ([place])   . Bipolar affective disorder, depressed, severe, with psychotic behavior ([place]) 06/09/2014    Class: Acute  . Bipolar 1 disorder, depressed, severe ([place]) 06/09/2014  . Bipolar I disorder, most recent episode (or current) manic ([place]) 05/06/2013  . Polysubstance abuse ([place]) 05/06/2013  . BIPOLAR AFFECTIVE DISORDER 08/11/2010  . HEPATITIS B CARRIER 08/11/2010    History reviewed. No pertinent surgical history.   OB History    Gravida  4   Para  3   Term  3   Preterm      AB  1   Living  2     SAB  0   TAB      Ectopic  1   Multiple      Live Births  3            Home Medications    Prior to Admission medications   Medication Sig Start Date End Date Taking?  Authorizing Provider  benztropine (COGENTIN) 0.5 MG tablet Take 1 tablet (0.5 mg total) by mouth 2 (two) times daily in the am and at bedtime.[name] 06/18/14   Trula Ore, NP  gabapentin (NEURONTIN) 100 MG capsule Take 1 capsule (100 mg total) by mouth at bedtime. 06/18/14   Trula Ore, NP  hydrOXYzine (VISTARIL) 25 MG capsule Take 1 capsule (25 mg total) by mouth 2 (two) times daily as needed for anxiety. 06/18/14   Trula Ore, NP  ibuprofen (ADVIL,MOTRIN) 800 MG tablet Take 1 tablet (800 mg total) by mouth 3 (three) times daily. 12/27/14   Dahlia Bailiff, PA-C  lamoTRIgine (LAMICTAL) 25 MG tablet Take 2 tablets (50 mg total) by mouth 2 (two) times daily. 06/18/14   Trula Ore, NP  naproxen (NAPROSYN) 500 MG tablet Take 1 tablet (500 mg total) by mouth 2 (two) times daily with a meal. 06/18/14   Eisbach, Karilyn Cota, NP  nitrofurantoin (MACRODANTIN) 100 MG capsule Take 1 capsule (100 mg total) by mouth every 12 (twelve) hours. 06/18/14   Trula Ore, NP  OLANZapine zydis (ZYPREXA) 10 MG disintegrating tablet Take 1 tablet (10 mg total) by mouth 2 (two) times daily at 8 am and 10  pm. 06/18/14   Trula Ore, NP  polyethylene glycol (MIRALAX / GLYCOLAX) packet Take 17 g by mouth 2 (two) times daily. 06/18/14   Trula Ore, NP  temazepam (RESTORIL) 30 MG capsule Take 1 capsule (30 mg total) by mouth at bedtime. 06/18/14   Trula Ore, NP  lurasidone (LATUDA) 80 MG TABS Take 80 mg by mouth daily with breakfast.    10/03/11  [provider]  sertraline (ZOLOFT) 50 MG tablet Take 50 mg by mouth daily.    10/03/11  [provider]    Family History Family History  Problem Relation Age of Onset  . Depression Other     Social History Social History   Tobacco Use  . Smoking status: Current Every Day Smoker    Packs/day: 0.50    Types: Cigarettes  . Smokeless tobacco: Never Used  Substance Use Topics  . Alcohol use: Yes    Comment: occ   .  Drug use: Yes    Types: Marijuana, Cocaine    Comment: daily     Allergies   Patient has no known allergies.   Review of Systems Review of Systems  Constitutional: Negative for chills and fever.  HENT: Negative for ear pain and sore throat.   Eyes: Negative for pain and visual disturbance.  Respiratory: Negative for cough and shortness of breath.   Cardiovascular: Positive for chest pain. Negative for palpitations.  Gastrointestinal: Negative for abdominal pain and vomiting.  Genitourinary: Negative for dysuria and hematuria.  Musculoskeletal: Negative for arthralgias and back pain.  Skin: Negative for color change and rash.  Neurological: Negative for seizures and syncope.  All other systems reviewed and are negative.    Physical Exam Updated Vital Signs  ED Triage Vitals  Enc Vitals Group     BP 04/07/18 1027 122/84     Pulse Rate 04/07/18 1027 79     Resp 04/07/18 1027 20     Temp 04/07/18 1027 98.2 F (36.8 C)     Temp Source 04/07/18 1027 Oral     SpO2 04/07/18 1019 97 %     Weight 04/07/18 1020 170 lb (77.1 kg)     Height 04/07/18 1020 5\' 5"  (1.651 m)     Head Circumference --      Peak Flow --      Pain Score 04/07/18 1020 5     Pain Loc --      Pain Edu? --      Excl. in [place]? --     Physical Exam  Constitutional: She appears well-developed and well-nourished. No distress.  HENT:  Head: Normocephalic and atraumatic.  Eyes: Pupils are equal, round, and reactive to light. Conjunctivae and EOM are normal.  Neck: Normal range of motion. Neck supple.  Cardiovascular: Normal rate, regular rhythm, intact distal pulses and normal pulses.  No murmur heard. Pulmonary/Chest: Effort normal and breath sounds normal. No respiratory distress. She has no decreased breath sounds.  Abdominal: Soft. There is no tenderness.  Musculoskeletal: She exhibits no edema.       Right lower leg: She exhibits no edema.       Left lower leg: She exhibits no edema.  Neurological:  She is alert.  Skin: Skin is warm and dry. Capillary refill takes less than 2 seconds.  Psychiatric: She has a normal mood and affect.  Nursing note and vitals reviewed.    ED Treatments / Results  Labs (all labs ordered are listed, but only abnormal  results are displayed) Labs Reviewed  BASIC METABOLIC PANEL - Abnormal; Notable for the following components:      Result Value   Glucose, Bld 109 (*)    BUN <5 (*)    All other components within normal limits  CBC - Abnormal; Notable for the following components:   MCV 104.3 (*)    MCH 34.4 (*)    All other components within normal limits  I-STAT TROPONIN, ED  I-STAT BETA HCG BLOOD, ED ([facility], [facility], [facility] ONLY)  I-STAT TROPONIN, ED    EKG EKG Interpretation  Date/Time:  Saturday April 07 2018 10:24:10 EDT Ventricular Rate:  76 PR Interval:  98 QRS Duration: 80 QT Interval:  404 QTC Calculation: 454 R Axis:   45 Text Interpretation:  Sinus rhythm with short PR Otherwise normal ECG No significant change since Confirmed by Lennice Sites [phone]) on 04/07/2018 12:00:21 PM   Radiology Dg Chest 2 View  Result Date: 04/07/2018 CLINICAL DATA:  47 year old female with history of central chest pain, nausea, shortness of breath and productive cough. EXAM: CHEST - 2 VIEW COMPARISON:  Chest x-ray 10/09/2013. FINDINGS: Lung volumes are normal. No consolidative airspace disease. No pleural effusions. No pneumothorax. No pulmonary nodule or mass noted. Pulmonary vasculature and the cardiomediastinal silhouette are within normal limits. IMPRESSION: No radiographic evidence of acute cardiopulmonary disease. Electronically Signed   By: Vinnie Langton M.D.   On: 04/07/2018 11:25    Procedures Procedures (including critical care time)  Medications Ordered in ED Medications  lactated ringers bolus 1,000 mL (1,000 mLs Intravenous New Bag/Given 04/07/18 1247)  LORazepam (ATIVAN) tablet 0.5 mg (0.5 mg Oral Given 04/07/18 1248)     Initial  Impression / Assessment and Plan / ED Course  I have reviewed the triage vital signs and the nursing notes.  Pertinent labs & imaging results that were available during my care of the patient were reviewed by me and considered in my medical decision making (see chart for details).     NAYALI TALERICO is a 47 year old female history of bipolar disorder, cocaine use, substance abuse who presents to the ED with chest pain.  Patient with normal vitals.  No fever.  Patient states that she went on a cocaine binge last night and has had chest pain for the last several hours.  Patient denies any cough, sputum production.  Had some alcohol last night as well.  Overall patient is appears well.  No abdominal pain on exam.  Clear breath sounds.  Clinically patient is sober.  Patient with no PE or DVT risk factors.  Is PERC negative and doubt PE.  EKG shows sinus rhythm.  No signs of ischemic changes.  Chest x-ray with no signs of pneumonia, pneumothorax, pleural effusion.  Patient with no significant anemia, electrolyte abnormality, kidney injury.  Troponin x2 within normal limits.  Negative pregnancy test.  Suspect vasospasm from cocaine use.  Patient given IV lactated Ringer bolus and Ativan and chest pain improved.  Counseled on drug use.  Recommend follow-up with primary care doctor and discharged from ED in good condition.  No concern for ACS at this time.  Hemodynamically stable throughout my care.  Final Clinical Impressions(s) / ED Diagnoses   Final diagnoses:  Chest pain, unspecified type    ED Discharge Orders    None       Lennice Sites, DO 04/07/18 1410

## 2018-04-07 NOTE — ED Triage Notes (Signed)
Midsternal CP radiating to the left arm. Used cocaine, marijuana and alcohol last night. VSS

## 2018-12-27 ENCOUNTER — Other Ambulatory Visit: Payer: Self-pay

## 2018-12-27 ENCOUNTER — Emergency Department (HOSPITAL_COMMUNITY)
Admission: EM | Admit: 2018-12-27 | Discharge: 2018-12-28 | Disposition: A | Discharge status: Other Acute Inpatient Hospital | Payer: Self-pay | Attending: Emergency Medicine | Admitting: Emergency Medicine

## 2018-12-27 DIAGNOSIS — F17210 Nicotine dependence, cigarettes, uncomplicated: Secondary | ICD-10-CM | POA: Insufficient documentation

## 2018-12-27 DIAGNOSIS — F1290 Cannabis use, unspecified, uncomplicated: Secondary | ICD-10-CM | POA: Insufficient documentation

## 2018-12-27 DIAGNOSIS — R45851 Suicidal ideations: Secondary | ICD-10-CM | POA: Insufficient documentation

## 2018-12-27 DIAGNOSIS — F329 Major depressive disorder, single episode, unspecified: Secondary | ICD-10-CM | POA: Insufficient documentation

## 2018-12-27 DIAGNOSIS — R441 Visual hallucinations: Secondary | ICD-10-CM | POA: Insufficient documentation

## 2018-12-27 DIAGNOSIS — Z1159 Encounter for screening for other viral diseases: Secondary | ICD-10-CM | POA: Insufficient documentation

## 2018-12-27 DIAGNOSIS — F1490 Cocaine use, unspecified, uncomplicated: Secondary | ICD-10-CM | POA: Insufficient documentation

## 2018-12-27 DIAGNOSIS — Z79899 Other long term (current) drug therapy: Secondary | ICD-10-CM | POA: Insufficient documentation

## 2018-12-27 LAB — COMPREHENSIVE METABOLIC PANEL
ALT: 16 U/L (ref 0–44)
AST: 21 U/L (ref 15–41)
Albumin: 3.9 g/dL (ref 3.5–5.0)
Alkaline Phosphatase: 53 U/L (ref 38–126)
Anion gap: 9 (ref 5–15)
BUN: 15 mg/dL (ref 6–20)
CO2: 24 mmol/L (ref 22–32)
Calcium: 9.4 mg/dL (ref 8.9–10.3)
Chloride: 109 mmol/L (ref 98–111)
Creatinine, Ser: 1.02 mg/dL — ABNORMAL HIGH (ref 0.44–1.00)
GFR calc Af Amer: >60 mL/min (ref >60)
GFR calc non Af Amer: >60 mL/min (ref >60)
Glucose, Bld: 94 mg/dL (ref 70–99)
Potassium: 4 mmol/L (ref 3.5–5.1)
Sodium: 142 mmol/L (ref 135–145)
Total Bilirubin: 0.4 mg/dL (ref 0.3–1.2)
Total Protein: 7.4 g/dL (ref 6.5–8.1)

## 2018-12-27 LAB — CBC
HCT: 41.4 % (ref 36.0–46.0)
Hemoglobin: 13.7 g/dL (ref 12.0–15.0)
MCH: 35.9 pg — ABNORMAL HIGH (ref 26.0–34.0)
MCHC: 33.1 g/dL (ref 30.0–36.0)
MCV: 108.4 fL — ABNORMAL HIGH (ref 80.0–100.0)
Platelets: 212 10*3/uL (ref 150–400)
RBC: 3.82 MIL/uL — ABNORMAL LOW (ref 3.87–5.11)
RDW: 12.1 % (ref 11.5–15.5)
WBC: 7.2 10*3/uL (ref 4.0–10.5)
nRBC: 0 % (ref 0.0–0.2)

## 2018-12-27 LAB — I-STAT BETA HCG BLOOD, ED (MC, WL, AP ONLY): I-stat hCG, quantitative: <5 m[IU]/mL (ref <5)

## 2018-12-27 LAB — ETHANOL: Alcohol, Ethyl (B): 129 mg/dL — ABNORMAL HIGH (ref <10)

## 2018-12-27 LAB — ACETAMINOPHEN LEVEL: Acetaminophen (Tylenol), Serum: <10 ug/mL — ABNORMAL LOW (ref 10–30)

## 2018-12-27 LAB — SALICYLATE LEVEL: Salicylate Lvl: <7 mg/dL (ref 2.8–30.0)

## 2018-12-27 MED ORDER — LORAZEPAM 1 MG PO TABS
1.0000 mg | ORAL_TABLET | Freq: Once | ORAL | Status: AC
  Administered 2018-12-27: 1 mg via ORAL
  Filled 2018-12-27: qty 1

## 2018-12-27 NOTE — ED Provider Notes (Signed)
[facility] EMERGENCY DEPARTMENT Provider Note   CSN: [id number] Arrival date & time: 12/27/18  1951    History   Chief Complaint Chief Complaint  Patient presents with  . Suicidal  . Depression    HPI [name] is a 48 y.o. female.     48 y.o female with a PMH of Bipolar, HTN, Hepatitis presents to the ED with a chief complaint of depression and suicidal ideations.  Patient reports her brother passed away last week, she states she is unsure what happened to him and does not know what caused his death but states his wife had something to do with it.  Patient reports she has been hysterical crying daily, has been unable to eat.  She does endorse suicidal ideations, reports she does not have an exact plan but states she really does not want to live anymore.  Patient is also endorsing visual hallucinations, states that she sees her brother standing in front of him, also whenever she closes her eyes.  She denies any homicidal ideations or previous suicide attempt.  The history is provided by the patient.    Past Medical History:  Diagnosis Date  . Anxiety   . Depression   . Hepatitis   . Hypertension     Patient Active Problem List   Diagnosis Date Noted  . Cannabis use disorder, severe, dependence ([place])   . Bipolar affective disorder, depressed, severe, with psychotic behavior ([place]) 06/09/2014    Class: Acute  . Bipolar 1 disorder, depressed, severe ([place]) 06/09/2014  . Bipolar I disorder, most recent episode (or current) manic ([place]) 05/06/2013  . Polysubstance abuse ([place]) 05/06/2013  . BIPOLAR AFFECTIVE DISORDER 08/11/2010  . HEPATITIS B CARRIER 08/11/2010    No past surgical history on file.   OB History    Gravida  4   Para  3   Term  3   Preterm      AB  1   Living  2     SAB  0   TAB      Ectopic  1   Multiple      Live Births  3            Home Medications    Prior to Admission medications   Medication Sig Start  Date End Date Taking? Authorizing Provider  benztropine (COGENTIN) 0.5 MG tablet Take 1 tablet (0.5 mg total) by mouth 2 (two) times daily in the am and at bedtime.[name] 06/18/14   Trula Ore, NP  gabapentin (NEURONTIN) 100 MG capsule Take 1 capsule (100 mg total) by mouth at bedtime. 06/18/14   Trula Ore, NP  hydrOXYzine (VISTARIL) 25 MG capsule Take 1 capsule (25 mg total) by mouth 2 (two) times daily as needed for anxiety. 06/18/14   Trula Ore, NP  ibuprofen (ADVIL,MOTRIN) 800 MG tablet Take 1 tablet (800 mg total) by mouth 3 (three) times daily. 12/27/14   Dahlia Bailiff, PA-C  lamoTRIgine (LAMICTAL) 25 MG tablet Take 2 tablets (50 mg total) by mouth 2 (two) times daily. 06/18/14   Trula Ore, NP  naproxen (NAPROSYN) 500 MG tablet Take 1 tablet (500 mg total) by mouth 2 (two) times daily with a meal. 06/18/14   Eisbach, Karilyn Cota, NP  nitrofurantoin (MACRODANTIN) 100 MG capsule Take 1 capsule (100 mg total) by mouth every 12 (twelve) hours. 06/18/14   Trula Ore, NP  OLANZapine zydis (ZYPREXA) 10 MG disintegrating tablet Take 1 tablet (  10 mg total) by mouth 2 (two) times daily at 8 am and 10 pm. 06/18/14   Trula Ore, NP  polyethylene glycol (MIRALAX / GLYCOLAX) packet Take 17 g by mouth 2 (two) times daily. 06/18/14   Trula Ore, NP  temazepam (RESTORIL) 30 MG capsule Take 1 capsule (30 mg total) by mouth at bedtime. 06/18/14   Trula Ore, NP  lurasidone (LATUDA) 80 MG TABS Take 80 mg by mouth daily with breakfast.    10/03/11  [provider]  sertraline (ZOLOFT) 50 MG tablet Take 50 mg by mouth daily.    10/03/11  [provider]    Family History Family History  Problem Relation Age of Onset  . Depression Other     Social History Social History   Tobacco Use  . Smoking status: Current Every Day Smoker    Packs/day: 0.50    Types: Cigarettes  . Smokeless tobacco: Never Used  Substance Use Topics  . Alcohol use: Yes     Comment: occ   . Drug use: Yes    Types: Marijuana, Cocaine    Comment: daily     Allergies   Patient has no known allergies.   Review of Systems Review of Systems  Constitutional: Negative for chills and fever.  HENT: Negative for ear pain and sore throat.   Eyes: Negative for pain and visual disturbance.  Respiratory: Negative for cough and shortness of breath.   Cardiovascular: Negative for chest pain and palpitations.  Gastrointestinal: Negative for abdominal pain and vomiting.  Genitourinary: Negative for dysuria and hematuria.  Musculoskeletal: Negative for arthralgias and back pain.  Skin: Negative for color change and rash.  Neurological: Negative for seizures and syncope.  Psychiatric/Behavioral: Positive for hallucinations and suicidal ideas. The patient is nervous/anxious.   All other systems reviewed and are negative.    Physical Exam Updated Vital Signs BP (!) 124/98   Pulse (!) 108   Temp 98.7 F (37.1 C) (Oral)   Resp (!) 24   Ht 5\' 6"  (1.676 m)   Wt 72.6 kg   SpO2 97%   BMI 25.82 kg/m   Physical Exam Vitals signs and nursing note reviewed.  Constitutional:      General: She is not in acute distress.    Appearance: She is well-developed.     Comments: Resting comfortably in bed, with teddy bear on the side.  HENT:     Head: Normocephalic and atraumatic.     Mouth/Throat:     Pharynx: No oropharyngeal exudate.  Eyes:     Pupils: Pupils are equal, round, and reactive to light.  Neck:     Musculoskeletal: Normal range of motion.  Cardiovascular:     Rate and Rhythm: Regular rhythm.     Heart sounds: Normal heart sounds.  Pulmonary:     Effort: Pulmonary effort is normal. No respiratory distress.     Breath sounds: Normal breath sounds.  Abdominal:     General: Bowel sounds are normal. There is no distension.     Palpations: Abdomen is soft.     Tenderness: There is no abdominal tenderness.  Musculoskeletal:        General: No tenderness  or deformity.     Right lower leg: No edema.     Left lower leg: No edema.  Skin:    General: Skin is warm and dry.  Neurological:     Mental Status: She is alert and oriented to person, place, and time.  Psychiatric:        Mood and Affect: Mood is depressed.        Behavior: Behavior normal.     Comments: Crying during intake.      ED Treatments / Results  Labs (all labs ordered are listed, but only abnormal results are displayed) Labs Reviewed  COMPREHENSIVE METABOLIC PANEL - Abnormal; Notable for the following components:      Result Value   Creatinine, Ser 1.02 (*)    All other components within normal limits  ETHANOL - Abnormal; Notable for the following components:   Alcohol, Ethyl (B) 129 (*)    All other components within normal limits  ACETAMINOPHEN LEVEL - Abnormal; Notable for the following components:   Acetaminophen (Tylenol), Serum <10 (*)    All other components within normal limits  CBC - Abnormal; Notable for the following components:   RBC 3.82 (*)    MCV 108.4 (*)    MCH 35.9 (*)    All other components within normal limits  SALICYLATE LEVEL  RAPID URINE DRUG SCREEN, HOSP PERFORMED  I-STAT BETA HCG BLOOD, ED ([facility], [facility], [facility] ONLY)    EKG None  Radiology No results found.  Procedures Procedures (including critical care time)  Medications Ordered in ED Medications  LORazepam (ATIVAN) tablet 1 mg (1 mg Oral Given 12/27/18 2025)     Initial Impression / Assessment and Plan / ED Course  I have reviewed the triage vital signs and the nursing notes.  Pertinent labs & imaging results that were available during my care of the patient were reviewed by me and considered in my medical decision making (see chart for details).    Patient with a prior medical history of bipolar presents to the ED with complaints of depression, suicidal ideations. She reports her brother recently passed away about a week ago, since then she has been crying daily, has visual  hallucinations of him standing in the room.  During arrival patient is teary-eyed, is crying during interview, she is holding onto teddy bear name Josph Macho for comfort.  CBC showed no leukocytosis.  CMP shows slight elevation in creatinine, suspect this is due to patient not having any water or food intake.  Salicylate level is normal.  Acetaminophen level is less than 10.  Beta hCG was negative.  Ethanol level was 129.  He denies any abdominal pain, chest pain, shortness of breath.  At this time will place TTS consult for evaluation, she does report a previous history of bipolar but has not been taking medication for this.  11:21 PM Spoke to [place] from [facility] who recommended patient for inpatient psychiatry.  Patient will be admitted to inpatient psychiatry.     Final Clinical Impressions(s) / ED Diagnoses   Final diagnoses:  Suicidal ideation    ED Discharge Orders    None       [name], Hershal Coria 12/27/18 2323    Hayden Rasmussen, MD 12/28/18 1053

## 2018-12-27 NOTE — ED Notes (Signed)
Pt provided 3 cheese, 6 packs of saltiines, 1 pk of graham crackers, and 2 gingerales
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
## 2018-12-27 NOTE — ED Triage Notes (Signed)
Patient states that her brother died on 11-07-22 and his hysterically crying. States that his death needs to be investigated. Pt states that she doesn't want to be here anymore, because she doesn't understand.

## 2018-12-27 NOTE — Progress Notes (Signed)
Joann Romp, NP recommends inpt tx. TTS to seek placement. Pt's nurse [name], RN and EDP [name], PA-C have been advised.  [name], MSW, LCSW Therapeutic Triage Specialist  [phone]

## 2018-12-27 NOTE — ED Notes (Signed)
TTS at bedside. 

## 2018-12-28 ENCOUNTER — Inpatient Hospital Stay (HOSPITAL_COMMUNITY)
Admission: AD | Admit: 2018-12-28 | Discharge: 2018-12-31 | DRG: 885 | Disposition: A | Discharge status: Home / Self Care | Payer: No Typology Code available for payment source | Source: Intra-hospital | Attending: Psychiatry | Admitting: Psychiatry

## 2018-12-28 ENCOUNTER — Encounter (HOSPITAL_COMMUNITY): Payer: Self-pay

## 2018-12-28 DIAGNOSIS — Z7289 Other problems related to lifestyle: Secondary | ICD-10-CM | POA: Diagnosis not present

## 2018-12-28 DIAGNOSIS — R45851 Suicidal ideations: Secondary | ICD-10-CM | POA: Diagnosis present

## 2018-12-28 DIAGNOSIS — F1010 Alcohol abuse, uncomplicated: Secondary | ICD-10-CM | POA: Diagnosis present

## 2018-12-28 DIAGNOSIS — F319 Bipolar disorder, unspecified: Principal | ICD-10-CM | POA: Diagnosis present

## 2018-12-28 DIAGNOSIS — Z1159 Encounter for screening for other viral diseases: Secondary | ICD-10-CM

## 2018-12-28 DIAGNOSIS — G4700 Insomnia, unspecified: Secondary | ICD-10-CM | POA: Diagnosis present

## 2018-12-28 DIAGNOSIS — F430 Acute stress reaction: Secondary | ICD-10-CM | POA: Diagnosis present

## 2018-12-28 DIAGNOSIS — F17210 Nicotine dependence, cigarettes, uncomplicated: Secondary | ICD-10-CM | POA: Diagnosis present

## 2018-12-28 DIAGNOSIS — F333 Major depressive disorder, recurrent, severe with psychotic symptoms: Secondary | ICD-10-CM | POA: Diagnosis present

## 2018-12-28 DIAGNOSIS — F411 Generalized anxiety disorder: Secondary | ICD-10-CM | POA: Diagnosis present

## 2018-12-28 DIAGNOSIS — F1210 Cannabis abuse, uncomplicated: Secondary | ICD-10-CM | POA: Diagnosis present

## 2018-12-28 DIAGNOSIS — F1290 Cannabis use, unspecified, uncomplicated: Secondary | ICD-10-CM | POA: Diagnosis not present

## 2018-12-28 LAB — SARS CORONAVIRUS 2: SARS Coronavirus 2: NOT DETECTED

## 2018-12-28 MED ORDER — OLANZAPINE 10 MG PO TABS
20.0000 mg | ORAL_TABLET | Freq: Every day | ORAL | Status: DC
  Administered 2018-12-28 – 2018-12-30 (×3): 20 mg via ORAL
  Filled 2018-12-28 (×5): qty 2

## 2018-12-28 MED ORDER — ALUM & MAG HYDROXIDE-SIMETH 200-200-20 MG/5ML PO SUSP: 30.0000 mL | ORAL | Status: DC | PRN

## 2018-12-28 MED ORDER — LITHIUM CARBONATE ER 450 MG PO TBCR
450.0000 mg | EXTENDED_RELEASE_TABLET | Freq: Every day | ORAL | Status: DC
  Administered 2018-12-28 – 2018-12-30 (×3): 450 mg via ORAL
  Filled 2018-12-28 (×5): qty 1

## 2018-12-28 MED ORDER — HYDROXYZINE HCL 25 MG PO TABS: 25.0000 mg | ORAL_TABLET | Freq: Four times a day (QID) | ORAL | Status: DC | PRN

## 2018-12-28 MED ORDER — NICOTINE POLACRILEX 2 MG MT GUM
CHEWING_GUM | OROMUCOSAL | Status: AC
  Filled 2018-12-28: qty 1

## 2018-12-28 MED ORDER — LOPERAMIDE HCL 2 MG PO CAPS: 2.0000 mg | ORAL_CAPSULE | ORAL | Status: AC | PRN

## 2018-12-28 MED ORDER — LORAZEPAM 1 MG PO TABS
1.0000 mg | ORAL_TABLET | Freq: Four times a day (QID) | ORAL | Status: AC | PRN
  Administered 2018-12-30: 02:00:00 1 mg via ORAL
  Filled 2018-12-28: qty 1

## 2018-12-28 MED ORDER — HYDROXYZINE HCL 50 MG PO TABS
50.0000 mg | ORAL_TABLET | Freq: Three times a day (TID) | ORAL | Status: DC
  Administered 2018-12-28 – 2018-12-31 (×9): 50 mg via ORAL
  Filled 2018-12-28 (×14): qty 1

## 2018-12-28 MED ORDER — HYDROXYZINE HCL 25 MG PO TABS
25.0000 mg | ORAL_TABLET | Freq: Three times a day (TID) | ORAL | Status: DC | PRN
  Administered 2018-12-29: 25 mg via ORAL
  Filled 2018-12-28 (×2): qty 1

## 2018-12-28 MED ORDER — VITAMIN B-1 100 MG PO TABS
100.0000 mg | ORAL_TABLET | Freq: Every day | ORAL | Status: DC
  Administered 2018-12-29 – 2018-12-31 (×3): 100 mg via ORAL
  Filled 2018-12-28 (×4): qty 1

## 2018-12-28 MED ORDER — TRAZODONE HCL 50 MG PO TABS
50.0000 mg | ORAL_TABLET | Freq: Every evening | ORAL | Status: DC | PRN
  Administered 2018-12-29: 50 mg via ORAL
  Filled 2018-12-28: qty 1

## 2018-12-28 MED ORDER — THIAMINE HCL 100 MG/ML IJ SOLN: 100.0000 mg | Freq: Once | INTRAMUSCULAR | Status: DC

## 2018-12-28 MED ORDER — MAGNESIUM HYDROXIDE 400 MG/5ML PO SUSP: 30.0000 mL | Freq: Every day | ORAL | Status: DC | PRN

## 2018-12-28 MED ORDER — ONDANSETRON 4 MG PO TBDP: 4.0000 mg | ORAL_TABLET | Freq: Four times a day (QID) | ORAL | Status: AC | PRN

## 2018-12-28 MED ORDER — ADULT MULTIVITAMIN W/MINERALS CH
1.0000 | ORAL_TABLET | Freq: Every day | ORAL | Status: DC
  Administered 2018-12-29 – 2018-12-31 (×3): 1 via ORAL
  Filled 2018-12-28 (×5): qty 1

## 2018-12-28 MED ORDER — ACETAMINOPHEN 325 MG PO TABS: 650.0000 mg | ORAL_TABLET | Freq: Four times a day (QID) | ORAL | Status: DC | PRN

## 2018-12-28 MED ORDER — TEMAZEPAM 30 MG PO CAPS
30.0000 mg | ORAL_CAPSULE | Freq: Every day | ORAL | Status: DC
  Administered 2018-12-28 – 2018-12-30 (×3): 30 mg via ORAL
  Filled 2018-12-28 (×3): qty 1

## 2018-12-28 MED ORDER — PRAZOSIN HCL 2 MG PO CAPS
2.0000 mg | ORAL_CAPSULE | Freq: Every day | ORAL | Status: DC
  Administered 2018-12-28 – 2018-12-30 (×3): 2 mg via ORAL
  Filled 2018-12-28 (×2): qty 1
  Filled 2018-12-28: qty 2
  Filled 2018-12-28 (×2): qty 1

## 2018-12-28 NOTE — ED Notes (Signed)
Cell phone locked up w/ security, belongings inventoried.  Placed one bag in locker #5

## 2018-12-28 NOTE — ED Provider Notes (Signed)
Emergency Medicine Observation Re-evaluation Note  [name] is a 48 y.o. female, seen on rounds today.  Pt initially presented to the ED for complaints of Suicidal and Depression Currently, the patient is sleeping.  Physical Exam  BP (!) 124/98   Pulse (!) 108   Temp 98.7 F (37.1 C) (Oral)   Resp (!) 24   Ht 5\' 6"  (1.676 m)   Wt 72.6 kg   SpO2 97%   BMI 25.82 kg/m  Physical Exam Constitutional:      General: She is not in acute distress. HENT:     Head: Normocephalic.  Pulmonary:     Effort: Pulmonary effort is normal.  Abdominal:     General: There is no distension.  Neurological:     Comments: asleep     ED Course / MDM  ERD:EYCX   I have reviewed the labs performed to date as well as medications administered while in observation.  Recent changes in the last 24 hours in3e Plan  Current plan is for INpatient at [facility]. Patient is not under full IVC at this time.   Margarita Mail, PA-C 12/28/18 1009    Maudie Flakes, MD 12/30/18 [phone]

## 2018-12-28 NOTE — Progress Notes (Signed)
Patient ID: [name], female   DOB: 02/07/1971, 48 y.o.   MRN: [id number] Patient admitted to the unit due to increased depression, SI and AVH.  Patient reports seeing and hearing the voice of her decreased grandmother and deceased brother.  Skin assessment complete patient found to be free of all injury and contraband.  Patient admitted to the unit without further incident.

## 2018-12-28 NOTE — Tx Team (Signed)
Initial Treatment Plan 12/28/2018 7:02 PM LOIDA CALAMIA ILN:[id number]    PATIENT STRESSORS: Loss of family   PATIENT STRENGTHS: Communication skills General fund of knowledge Supportive family/friends   PATIENT IDENTIFIED PROBLEMS: "I have been hearing voices of my dead relatives"  "I have not been sleeping"                   DISCHARGE CRITERIA:  Improved stabilization in mood, thinking, and/or behavior Verbal commitment to aftercare and medication compliance  PRELIMINARY DISCHARGE PLAN: Return to previous living arrangement  PATIENT/FAMILY INVOLVEMENT: This treatment plan has been presented to and reviewed with the patient, SIMONE RODENBECK, and/or family member,   The patient and family have been given the opportunity to ask questions and make suggestions.  Clarita Crane, RN 12/28/2018, 7:02 PM

## 2018-12-28 NOTE — Progress Notes (Signed)
Pt accepted to  [facility] Bed [address]2 Lindon Romp, NP is the accepting provider.  Johnn Hai, MD. is the attending provider.  Call report to [phone]  @ [facility] ED notified.   Pt is Voluntary.  Pt may be transported by [facility]  Pt scheduled to arrive at [place]  Romie Minus T. Judi Cong, MSW, [place] Disposition Clinical Social Work [phone] (cell) [phone] (office)

## 2018-12-28 NOTE — ED Notes (Signed)
Pt had  Told night nurse that she was refusing the COVID swab due to her uncle saying that it hurt

## 2018-12-28 NOTE — ED Notes (Signed)
Pt did allow nurse to swab her

## 2018-12-28 NOTE — ED Notes (Signed)
Pt refused to get her nose swabbed for the COVID-19 virus.

## 2018-12-28 NOTE — Progress Notes (Signed)
Nursing Progress Note: 7p-7a D: Pt currently presents with a pleasant/cooperative affect and behavior. Pt states "My brother died and no one knows why. I have a lot of questions and no answers. That's why I'm here." Interacting appropriately with the milieu. Pt reports good sleep during the previous night with current medication regimen. Pt did attend wrap-up group.  A: Pt provided with medications per providers orders. Pt's labs and vitals were monitored throughout the night. Pt supported emotionally and encouraged to express concerns and questions. Pt educated on medications.  R: Pt's safety ensured with 15 minute and environmental checks. Pt currently denies SI, HI, and endorses AVH. Pt verbally contracts to seek staff if SI,HI, or AVH occurs and to consult with staff before acting on any harmful thoughts. Will continue to monitor.

## 2018-12-28 NOTE — [facility] Suicide Risk Assessment (Signed)
[facility] Admission Suicide Risk Assessment   Nursing information obtained from:  Patient Demographic factors:  NA Current Mental Status:  Suicidal ideation indicated by patient Loss Factors:  NA Historical Factors:  NA Risk Reduction Factors:  Positive social support  Total Time spent with patient: 45 minutes Principal Problem: Bipolar depressed with psychosis Diagnosis:  Active Problems:   MDD (major depressive disorder), recurrent, severe, with psychosis ([place])  Subjective Data: Worsening prompted by unexpected death of brother Continued Clinical Symptoms:  Alcohol Use Disorder Identification Test Final Score (AUDIT): 11 The "Alcohol Use Disorders Identification Test", Guidelines for Use in Primary Care, Second Edition.  World Pharmacologist [facility]). Score between 0-7:  no or low risk or alcohol related problems. Score between 8-15:  moderate risk of alcohol related problems. Score between 16-19:  high risk of alcohol related problems. Score 20 or above:  warrants further diagnostic evaluation for alcohol dependence and treatment.  Musculoskeletal: Strength & Muscle Tone: within normal limits Gait & Station: normal Patient leans: N/A  Psychiatric Specialty Exam: Physical Exam no acute findings  ROS neurological negative for head, or seizures vascular normal GI normal  Blood pressure 126/73, pulse 73, temperature 98.5 F (36.9 C), temperature source Oral, resp. rate 18, SpO2 100 %.There is no height or weight on file to calculate BMI.  General Appearance: Casual  Eye Contact:  Good  Speech:  Clear and Coherent and Pressured  Volume:  Increased  Mood:  labile  Affect:  Congruent and Full Range  Thought Process:  Goal Directed and Descriptions of Associations: Tangential  Orientation:  Full (Time, Place, and Person)  Thought Content:  Logical and Hallucinations: Auditory  Suicidal Thoughts: Intermittent without plans can contract here  Homicidal Thoughts:  No made vague  statements about harming people that might of hurt her brother but there is no basis to assume that he was murdered and she has no one in mind when she says this  Memory:  Immediate;   Good  Judgement:  Good  Insight:  Good  Psychomotor Activity:  Normal  Concentration:  Concentration: Fair  Recall:  Poor  Fund of Knowledge:  Fair  Language:  nl  Akathisia:  Negative  Handed:  Right  AIMS (if indicated):     Assets:  Physical Health Resilience  ADL's:  Intact  Cognition:  WNL  Sleep:       CLINICAL FACTORS:   Bipolar Disorder:   Mixed State  COGNITIVE FEATURES THAT CONTRIBUTE TO RISK:  Polarized thinking    SUICIDE RISK:   Moderate:  Frequent suicidal ideation with limited intensity, and duration, some specificity in terms of plans, no associated intent, good self-control, limited dysphoria/symptomatology, some risk factors present, and identifiable protective factors, including available and accessible social support.  PLAN OF CARE: See adjustments in meds and cognitive therapy to begin  I certify that inpatient services furnished can reasonably be expected to improve the patient's condition.   Johnn Hai, MD 12/28/2018, 3:43 PM
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
## 2018-12-28 NOTE — Progress Notes (Signed)
[name] NOVEL CORONAVIRUS (COVID-19) DAILY CHECK-OFF SYMPTOMS - answer yes or no to each - every day NO YES  Have you had a fever in the past 24 hours?  . Fever (Temp > 37.80C / 100F) X   Have you had any of these symptoms in the past 24 hours? . New Cough .  Sore Throat  .  Shortness of Breath .  Difficulty Breathing .  Unexplained Body Aches   X   Have you had any one of these symptoms in the past 24 hours not related to allergies?   . Runny Nose .  Nasal Congestion .  Sneezing   X   If you have had runny nose, nasal congestion, sneezing in the past 24 hours, has it worsened?  X   EXPOSURES - check yes or no X   Have you traveled outside the state in the past 14 days?  X   Have you been in contact with someone with a confirmed diagnosis of COVID-19 or PUI in the past 14 days without wearing appropriate PPE?  X   Have you been living in the same home as a person with confirmed diagnosis of COVID-19 or a PUI (household contact)?    X   Have you been diagnosed with COVID-19?    X              What to do next: Answered NO to all: Answered YES to anything:   Proceed with unit schedule Follow the [facility] Inpatient Flowsheet.   

## 2018-12-29 DIAGNOSIS — F430 Acute stress reaction: Secondary | ICD-10-CM

## 2018-12-29 DIAGNOSIS — F1290 Cannabis use, unspecified, uncomplicated: Secondary | ICD-10-CM

## 2018-12-29 DIAGNOSIS — Z7289 Other problems related to lifestyle: Secondary | ICD-10-CM

## 2018-12-29 LAB — TSH: TSH: 2.175 u[IU]/mL (ref 0.350–4.500)

## 2018-12-29 MED ORDER — NICOTINE 21 MG/24HR TD PT24
21.0000 mg | MEDICATED_PATCH | Freq: Every day | TRANSDERMAL | Status: DC
  Administered 2018-12-29 – 2018-12-30 (×2): 21 mg via TRANSDERMAL
  Filled 2018-12-29 (×5): qty 1

## 2018-12-29 NOTE — Progress Notes (Signed)
DAR NOTE: Patient presents with anxious affect and mood.  Denies suicidal thoughts, auditory and visual hallucinations.  Voiced concern and is preoccupied about her brother's death.  Patient with period of crying spells on the unit.  Described energy level as low and concentration as poor.  Maintained on routine safety checks.  Medications given as prescribed.  Support and encouragement offered as needed. Patient observed socializing with peers in the dayroom.  Patient is safe on and off the unit.

## 2018-12-29 NOTE — Progress Notes (Signed)
[facility] MD Progress Note  12/29/2018 12:04 PM [name]  MRN:  [id number] Subjective: Patient is a 48 year old female with a past psychiatric history of bipolar disorder, generalized anxiety, alcohol use disorder and cannabis use disorder.  She was admitted on 12/28/2018 with visual hallucinations, insomnia, agitation and inability to stop ruminating about her brother's death.  Objective: Patient is seen and examined.  Patient is a 48 year old female with the above-stated past psychiatric history who is seen in follow-up.  She is currently being treated with hydroxyzine, lithium carbonate, lorazepam, prazosin, temazepam, thiamine and trazodone.  Her vital signs are stable, she is afebrile.  Her CIWA most recently was only 1.  She slept 6 hours last night.  Review of her laboratories revealed an increased MCV at 108.4, a blood alcohol on admission of 129 but it does not appear as though a drug screen was obtained.  She remains quite distraught over the death of her brother.  She is very tearful, and upset over the fact that he could not be transported back to [place] from [place], and the fact that there was no cause of death listed.  She stated that she does not drink alcohol that frequently, even in the presence of her blood alcohol, and that she smokes marijuana only occasionally.  Principal Problem: <principal problem not specified> Diagnosis: Active Problems:   MDD (major depressive disorder), recurrent, severe, with psychosis ([place])  Total Time spent with patient: 15 minutes  Past Psychiatric History: See admission H&P  Past Medical History:  Past Medical History:  Diagnosis Date  . Anxiety   . Depression   . Hepatitis   . Hypertension    History reviewed. No pertinent surgical history. Family History:  Family History  Problem Relation Age of Onset  . Depression Other    Family Psychiatric  History: See admission H&P Social History:  Social History   Substance and Sexual  Activity  Alcohol Use Yes   Comment: occ      Social History   Substance and Sexual Activity  Drug Use Yes  . Types: Marijuana, Cocaine   Comment: daily    Social History   Socioeconomic History  . Marital status: Single    Spouse name: Not on file  . Number of children: Not on file  . Years of education: Not on file  . Highest education level: Not on file  Occupational History  . Not on file  Social Needs  . Financial resource strain: Not on file  . Food insecurity:    Worry: Not on file    Inability: Not on file  . Transportation needs:    Medical: Not on file    Non-medical: Not on file  Tobacco Use  . Smoking status: Current Every Day Smoker    Packs/day: 0.50    Types: Cigarettes  . Smokeless tobacco: Never Used  Substance and Sexual Activity  . Alcohol use: Yes    Comment: occ   . Drug use: Yes    Types: Marijuana, Cocaine    Comment: daily  . Sexual activity: Yes    Birth control/protection: None  Lifestyle  . Physical activity:    Days per week: Not on file    Minutes per session: Not on file  . Stress: Not on file  Relationships  . Social connections:    Talks on phone: Not on file    Gets together: Not on file    Attends religious service: Not on file  Active member of club or organization: Not on file    Attends meetings of clubs or organizations: Not on file    Relationship status: Not on file  Other Topics Concern  . Not on file  Social History Narrative  . Not on file   Additional Social History:                         Sleep: Fair  Appetite:  Fair  Current Medications: Current Facility-Administered Medications  Medication Dose Route Frequency Provider Last Rate Last Dose  . acetaminophen (TYLENOL) tablet 650 mg  650 mg Oral Q6H PRN Money, Lowry Ram, FNP      . alum & mag hydroxide-simeth (MAALOX/MYLANTA) 200-200-20 MG/5ML suspension 30 mL  30 mL Oral Q4H PRN Money, Lowry Ram, FNP      . hydrOXYzine (ATARAX/VISTARIL)  tablet 25 mg  25 mg Oral TID PRN Money, Lowry Ram, FNP      . hydrOXYzine (ATARAX/VISTARIL) tablet 25 mg  25 mg Oral Q6H PRN Money, Lowry Ram, FNP      . hydrOXYzine (ATARAX/VISTARIL) tablet 50 mg  50 mg Oral TID Johnn Hai, MD   50 mg at 12/29/18 0836  . lithium carbonate (ESKALITH) CR tablet 450 mg  450 mg Oral QHS Johnn Hai, MD   450 mg at 12/28/18 2101  . loperamide (IMODIUM) capsule 2-4 mg  2-4 mg Oral PRN Money, Lowry Ram, FNP      . LORazepam (ATIVAN) tablet 1 mg  1 mg Oral Q6H PRN Money, Lowry Ram, FNP      . magnesium hydroxide (MILK OF MAGNESIA) suspension 30 mL  30 mL Oral Daily PRN Money, Lowry Ram, FNP      . multivitamin with minerals tablet 1 tablet  1 tablet Oral Daily Money, Lowry Ram, FNP   1 tablet at 12/29/18 0836  . OLANZapine (ZYPREXA) tablet 20 mg  20 mg Oral QHS Johnn Hai, MD   20 mg at 12/28/18 2100  . ondansetron (ZOFRAN-ODT) disintegrating tablet 4 mg  4 mg Oral Q6H PRN Money, Darnelle Maffucci B, FNP      . prazosin (MINIPRESS) capsule 2 mg  2 mg Oral QHS Johnn Hai, MD   2 mg at 12/28/18 2100  . temazepam (RESTORIL) capsule 30 mg  30 mg Oral QHS Johnn Hai, MD   30 mg at 12/28/18 2100  . thiamine (B-1) injection 100 mg  100 mg Intramuscular Once Money, Darnelle Maffucci B, FNP      . thiamine (VITAMIN B-1) tablet 100 mg  100 mg Oral Daily Money, Lowry Ram, FNP   100 mg at 12/29/18 0836  . traZODone (DESYREL) tablet 50 mg  50 mg Oral QHS PRN Money, Lowry Ram, FNP        Lab Results:  Results for orders placed or performed during the hospital encounter of 12/28/18 (from the past 48 hour(s))  TSH     Status: None   Collection Time: 12/29/18  6:54 AM  Result Value Ref Range   TSH 2.175 0.350 - 4.500 uIU/mL    Comment: Performed by a 3rd Generation assay with a functional sensitivity of <=0.01 uIU/mL. Performed at [facility], [place] [address] [place], [place] [address]     Blood Alcohol level:  Lab Results  Component Value Date   ETH 129 (H) 12/27/2018   ETH  <11 81/44/8185    Metabolic Disorder Labs: Lab Results  Component Value Date   HGBA1C 5.1  06/11/2014   MPG 100 06/11/2014   Lab Results  Component Value Date   PROLACTIN  01/03/2007    74.2 (NOTE)     Reference Ranges:                 Female:                       2.1 -  17.1 ng/ml                 Female:   Pregnant          9.7 - 208.5 ng/mL                           Non Pregnant      2.8 -  29.2 ng/mL                           Post  Menopausal   1.8 -  20.3 ng/mL                     Lab Results  Component Value Date   CHOL 156 06/11/2014   TRIG 50 06/11/2014   HDL 60 06/11/2014   CHOLHDL 2.6 06/11/2014   VLDL 10 06/11/2014   LDLCALC 86 06/11/2014    Physical Findings: AIMS:  , ,  ,  ,    CIWA:  CIWA-Ar Total: 1 COWS:     Musculoskeletal: Strength & Muscle Tone: within normal limits Gait & Station: normal Patient leans: N/A  Psychiatric Specialty Exam: Physical Exam  Nursing note and vitals reviewed. Constitutional: She is oriented to person, place, and time. She appears well-developed and well-nourished.  HENT:  Head: Normocephalic and atraumatic.  Respiratory: Effort normal.  Neurological: She is alert and oriented to person, place, and time.    ROS  Blood pressure 118/81, pulse 85, temperature 98.8 F (37.1 C), temperature source Oral, resp. rate 18, height 5\' 1"  (1.549 m), SpO2 100 %.Body mass index is 30.23 kg/m.  General Appearance: Casual  Eye Contact:  Fair  Speech:  Normal Rate  Volume:  Normal  Mood:  Anxious and Depressed  Affect:  Congruent  Thought Process:  Coherent and Descriptions of Associations: Intact  Orientation:  Full (Time, Place, and Person)  Thought Content:  Logical and Hallucinations: Auditory Visual  Suicidal Thoughts:  No  Homicidal Thoughts:  No  Memory:  Immediate;   Fair Recent;   Fair Remote;   Fair  Judgement:  Intact  Insight:  Fair  Psychomotor Activity:  Increased  Concentration:  Concentration: Fair and Attention  Span: Fair  Recall:  [employer] of Knowledge:  Fair  Language:  Fair  Akathisia:  Negative  Handed:  Right  AIMS (if indicated):     Assets:  Desire for Improvement Resilience  ADL's:  Intact  Cognition:  WNL  Sleep:  Number of Hours: 6     Treatment Plan Summary: Daily contact with patient to assess and evaluate symptoms and progress in treatment, Medication management and Plan : Patient is seen and examined.  Patient is a 48 year old female with the above-stated past psychiatric history who is seen in follow-up.   Diagnosis: #1 acute stress reaction, #2 bipolar disorder, #3 alcohol use disorder, #4 marijuana use disorder.  Patient is seen and examined.  Patient is a 48 year old female with the above-stated past psychiatric history who is  seen in follow-up.  She has a history of bipolar disorder, and has had some psychotic symptoms after the death of her brother.  She has previously been treated with gabapentin, Lamictal, Zyprexa, temazepam, Latuda, Zoloft.  She was placed on lithium carbonate CR 450 mg p.o. daily, Zyprexa 20 mg p.o. nightly, prazosin 2 mg p.o. nightly.  She still very tearful and depressed.  No change in her current medications.  Once her mood is stabilized there may be some consideration of addition of an antidepressant medication if the lithium itself is not effective. 1.  Continue hydroxyzine 25 mg p.o. 3 times daily as needed anxiety. 2.  Continue lithium carbonate CR 4 and 50 mg p.o. nightly for mood stability. 3.  Continue lorazepam 1 mg p.o. every 6 hours PRN CIWA greater than 10. 4.  Continue Zyprexa 20 mg p.o. nightly for mood stability and psychosis. 5.  Continue prazosin 2 mg p.o. nightly for nightmares and flashbacks. 6.  Continue temazepam 30 mg p.o. nightly for insomnia. 7.  Continue thiamine 100 mg p.o. daily for nutritional supplementation. 8.  Continue trazodone 50 mg p.o. nightly as needed insomnia. 9.  Therapeutic counseling for grief. 10.   Disposition planning-in progress.  Sharma Covert, MD 12/29/2018, 12:04 PM

## 2018-12-29 NOTE — [facility] Group Notes (Signed)
[facility] Group Notes: (Clinical Social Work)   12/29/2018      Type of Therapy:  Group Therapy   Participation Level:  Did Not Attend - was invited both individually by [facility] and by overhead announcement, chose not to attend.   Chord Takahashi Grossman-Orr, LCSW 12/29/2018, 12:47 PM     

## 2018-12-29 NOTE — Progress Notes (Signed)
D.  Pt pleasant on approach, very tearful about brother.  Pt does state that she spoke to her mom and her brother's ashes are now here in [place] with mom.  Pt states that she must be discharged by Tuesday because that is when there will be a service here.  Pt compliant with medication regimen and observed engaged in appropriate interaction with peers on the unit.  Pt denies SI/HI/AVH at this time.  A.  Support and encouragement offered, medication given as ordered.  R.  Pt remains safe on the unit, will continue to monitor.

## 2018-12-30 MED ORDER — NICOTINE POLACRILEX 2 MG MT GUM
2.0000 mg | CHEWING_GUM | OROMUCOSAL | Status: DC | PRN
  Administered 2018-12-30: 2 mg via ORAL

## 2018-12-30 NOTE — Progress Notes (Signed)
D.  Pt pleasant on approach, no complaints noted other than could not sleep last night.  Pt is somewhat intrusive, but has generally interacted well with peers on the unit.  Pt denies SI/HI/AVH at this time.  A.  Support and encouragement offered, medication given as ordered.  R.  Pt remains safe on the unit, will continue to monitor.

## 2018-12-30 NOTE — Progress Notes (Signed)
[place] NOVEL CORONAVIRUS (COVID-19) DAILY CHECK-OFF SYMPTOMS - answer yes or no to each - every day NO YES  Have you had a fever in the past 24 hours?  . Fever (Temp > 37.80C / 100F) X   Have you had any of these symptoms in the past 24 hours? . New Cough .  Sore Throat  .  Shortness of Breath .  Difficulty Breathing .  Unexplained Body Aches   X   Have you had any one of these symptoms in the past 24 hours not related to allergies?   . Runny Nose .  Nasal Congestion .  Sneezing   X   If you have had runny nose, nasal congestion, sneezing in the past 24 hours, has it worsened?  X   EXPOSURES - check yes or no X   Have you traveled outside the state in the past 14 days?  X   Have you been in contact with someone with a confirmed diagnosis of COVID-19 or PUI in the past 14 days without wearing appropriate PPE?  X   Have you been living in the same home as a person with confirmed diagnosis of COVID-19 or a PUI (household contact)?    X   Have you been diagnosed with COVID-19?    X              What to do next: Answered NO to all: Answered YES to anything:   Proceed with unit schedule Follow the [facility] Inpatient Flowsheet.   

## 2018-12-30 NOTE — [facility] Group Notes (Signed)
[facility] Group Notes: (Clinical Social Work)   12/30/2018      Type of Therapy:  Group Therapy   Participation Level:  Did Not Attend - was invited both individually by MHT and by overhead announcement, chose not to attend.   Margueritte Guthridge Grossman-Orr, LCSW 12/30/2018, 12:48 PM     

## 2018-12-30 NOTE — Progress Notes (Signed)
D. Pt is friendly upon approach- smiles during interactions- pt does continue to complain of 'hot flashes' at times. Per pt's self inventory, pt rated her depression, hopelessness and anxiety all 7's. Pt writes that her goal today is to work on "self" and "rest".   Pt currently denies SI/HI and AVH   A. Labs and vitals monitored. Pt compliant with medications. Pt supported emotionally and encouraged to express concerns - provided pt with ice packs for hot flashes  R. Pt remains safe with 15 minute checks. Will continue POC.
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
## 2018-12-30 NOTE — Progress Notes (Signed)
[facility] MD Progress Note  12/30/2018 10:21 AM [name]  MRN:  [id number] Subjective:  Patient is a 48 year old female with a past psychiatric history of bipolar disorder, generalized anxiety, alcohol use disorder and cannabis use disorder.  She was admitted on 12/28/2018 with visual hallucinations, insomnia, agitation and inability to stop ruminating about her brother's death.  Objective: Patient is seen and examined.  Patient is a 48 year old female with the above-stated past psychiatric history who is seen in follow-up.  She remains on hydroxyzine, lithium carbonate, lorazepam, prazosin, temazepam, thiamine and trazodone.  Nursing notes reflect that she has remained very tearful about her brother.  The patient did state that she spoke to her mother, and her brother's ashes are now in [place] with mom.  Her vital signs are stable, she is afebrile.  She slept 5.75 hours last night.  No new laboratories.  This morning she is much improved.  She is able to smile and engage.  She got a good night sleep.  She denied any suicidal ideation.  We discussed the loss of her brother.  She is more appropriate with that.  No auditory visual hallucinations.  No suicidal or homicidal ideation.  No side effects to medications.  Principal Problem: <principal problem not specified> Diagnosis: Active Problems:   MDD (major depressive disorder), recurrent, severe, with psychosis ([place])  Total Time spent with patient: 15 minutes  Past Psychiatric History: See admission H&P  Past Medical History:  Past Medical History:  Diagnosis Date  . Anxiety   . Depression   . Hepatitis   . Hypertension    History reviewed. No pertinent surgical history. Family History:  Family History  Problem Relation Age of Onset  . Depression Other    Family Psychiatric  History: See admission H&P Social History:  Social History   Substance and Sexual Activity  Alcohol Use Yes   Comment: occ      Social History    Substance and Sexual Activity  Drug Use Yes  . Types: Marijuana, Cocaine   Comment: daily    Social History   Socioeconomic History  . Marital status: Single    Spouse name: Not on file  . Number of children: Not on file  . Years of education: Not on file  . Highest education level: Not on file  Occupational History  . Not on file  Social Needs  . Financial resource strain: Not on file  . Food insecurity:    Worry: Not on file    Inability: Not on file  . Transportation needs:    Medical: Not on file    Non-medical: Not on file  Tobacco Use  . Smoking status: Current Every Day Smoker    Packs/day: 0.50    Types: Cigarettes  . Smokeless tobacco: Never Used  Substance and Sexual Activity  . Alcohol use: Yes    Comment: occ   . Drug use: Yes    Types: Marijuana, Cocaine    Comment: daily  . Sexual activity: Yes    Birth control/protection: None  Lifestyle  . Physical activity:    Days per week: Not on file    Minutes per session: Not on file  . Stress: Not on file  Relationships  . Social connections:    Talks on phone: Not on file    Gets together: Not on file    Attends religious service: Not on file    Active member of club or organization: Not on file  Attends meetings of clubs or organizations: Not on file    Relationship status: Not on file  Other Topics Concern  . Not on file  Social History Narrative  . Not on file   Additional Social History:                         Sleep: Good  Appetite:  Good  Current Medications: Current Facility-Administered Medications  Medication Dose Route Frequency Provider Last Rate Last Dose  . acetaminophen (TYLENOL) tablet 650 mg  650 mg Oral Q6H PRN Money, Lowry Ram, FNP      . alum & mag hydroxide-simeth (MAALOX/MYLANTA) 200-200-20 MG/5ML suspension 30 mL  30 mL Oral Q4H PRN Money, Lowry Ram, FNP      . hydrOXYzine (ATARAX/VISTARIL) tablet 25 mg  25 mg Oral TID PRN Money, Lowry Ram, FNP   25 mg at  12/29/18 2350  . hydrOXYzine (ATARAX/VISTARIL) tablet 50 mg  50 mg Oral TID Johnn Hai, MD   50 mg at 12/30/18 0800  . lithium carbonate (ESKALITH) CR tablet 450 mg  450 mg Oral QHS Johnn Hai, MD   450 mg at 12/29/18 2106  . loperamide (IMODIUM) capsule 2-4 mg  2-4 mg Oral PRN Money, Lowry Ram, FNP      . LORazepam (ATIVAN) tablet 1 mg  1 mg Oral Q6H PRN Money, Lowry Ram, FNP   1 mg at 12/30/18 0226  . magnesium hydroxide (MILK OF MAGNESIA) suspension 30 mL  30 mL Oral Daily PRN Money, Lowry Ram, FNP      . multivitamin with minerals tablet 1 tablet  1 tablet Oral Daily Money, Lowry Ram, FNP   1 tablet at 12/30/18 0800  . nicotine (NICODERM CQ - dosed in mg/24 hours) patch 21 mg  21 mg Transdermal Daily Sharma Covert, MD   21 mg at 12/30/18 0802  . OLANZapine (ZYPREXA) tablet 20 mg  20 mg Oral QHS Johnn Hai, MD   20 mg at 12/29/18 2106  . ondansetron (ZOFRAN-ODT) disintegrating tablet 4 mg  4 mg Oral Q6H PRN Money, Darnelle Maffucci B, FNP      . prazosin (MINIPRESS) capsule 2 mg  2 mg Oral QHS Johnn Hai, MD   2 mg at 12/29/18 2106  . temazepam (RESTORIL) capsule 30 mg  30 mg Oral QHS Johnn Hai, MD   30 mg at 12/29/18 2106  . thiamine (B-1) injection 100 mg  100 mg Intramuscular Once Money, Darnelle Maffucci B, FNP      . thiamine (VITAMIN B-1) tablet 100 mg  100 mg Oral Daily Money, Darnelle Maffucci B, FNP   100 mg at 12/30/18 0800  . traZODone (DESYREL) tablet 50 mg  50 mg Oral QHS PRN Money, Lowry Ram, FNP   50 mg at 12/29/18 2350    Lab Results:  Results for orders placed or performed during the hospital encounter of 12/28/18 (from the past 48 hour(s))  TSH     Status: None   Collection Time: 12/29/18  6:54 AM  Result Value Ref Range   TSH 2.175 0.350 - 4.500 uIU/mL    Comment: Performed by a 3rd Generation assay with a functional sensitivity of <=0.01 uIU/mL. Performed at [facility], [place] [address], [place], [place] [address]     Blood Alcohol level:  Lab Results  Component  Value Date   ETH 129 (H) 12/27/2018   ETH <11 60/45/4098    Metabolic Disorder Labs: Lab Results  Component Value  Date   HGBA1C 5.1 06/11/2014   MPG 100 06/11/2014   Lab Results  Component Value Date   PROLACTIN  01/03/2007    74.2 (NOTE)     Reference Ranges:                 Female:                       2.1 -  17.1 ng/ml                 Female:   Pregnant          9.7 - 208.5 ng/mL                           Non Pregnant      2.8 -  29.2 ng/mL                           Post  Menopausal   1.8 -  20.3 ng/mL                     Lab Results  Component Value Date   CHOL 156 06/11/2014   TRIG 50 06/11/2014   HDL 60 06/11/2014   CHOLHDL 2.6 06/11/2014   VLDL 10 06/11/2014   LDLCALC 86 06/11/2014    Physical Findings: AIMS: Facial and Oral Movements Muscles of Facial Expression: None, normal Lips and Perioral Area: None, normal Jaw: None, normal Tongue: None, normal,Extremity Movements Upper (arms, wrists, hands, fingers): None, normal Lower (legs, knees, ankles, toes): None, normal, Trunk Movements Neck, shoulders, hips: None, normal, Overall Severity Severity of abnormal movements (highest score from questions above): None, normal Incapacitation due to abnormal movements: None, normal Patient's awareness of abnormal movements (rate only patient's report): No Awareness, Dental Status Current problems with teeth and/or dentures?: No Does patient usually wear dentures?: No  CIWA:  CIWA-Ar Total: 1 COWS:     Musculoskeletal: Strength & Muscle Tone: within normal limits Gait & Station: normal Patient leans: N/A  Psychiatric Specialty Exam: Physical Exam  Nursing note and vitals reviewed. Constitutional: She is oriented to person, place, and time. She appears well-developed and well-nourished.  HENT:  Head: Normocephalic and atraumatic.  Respiratory: Effort normal.  Neurological: She is alert and oriented to person, place, and time.    ROS  Blood pressure 110/83, pulse  91, temperature 97.9 F (36.6 C), temperature source Oral, resp. rate 18, height 5\' 1"  (1.549 m), SpO2 100 %.Body mass index is 30.23 kg/m.  General Appearance: Casual  Eye Contact:  Good  Speech:  Normal Rate  Volume:  Normal  Mood:  Anxious  Affect:  Congruent  Thought Process:  Coherent and Descriptions of Associations: Intact  Orientation:  Full (Time, Place, and Person)  Thought Content:  Logical  Suicidal Thoughts:  No  Homicidal Thoughts:  No  Memory:  Immediate;   Fair Recent;   Fair Remote;   Fair  Judgement:  Intact  Insight:  Fair  Psychomotor Activity:  Increased  Concentration:  Concentration: Fair and Attention Span: Fair  Recall:  [employer] of Knowledge:  Fair  Language:  Fair  Akathisia:  Negative  Handed:  Right  AIMS (if indicated):     Assets:  Desire for Improvement Resilience  ADL's:  Intact  Cognition:  WNL  Sleep:  Number of Hours: 5.75     Treatment  Plan Summary: Daily contact with patient to assess and evaluate symptoms and progress in treatment, Medication management and Plan : Patient is seen and examined.  Patient is a 48 year old female with the above-stated past psychiatric history who is seen in follow-up.    Diagnosis: #1 acute stress reaction, #2 bipolar disorder, #3 alcohol use disorder, #4 marijuana use disorder.  Patient is seen in follow-up.  She is doing much better this morning.  She is less tearful, less labile.  She is coping better with the loss of her brother.  She is still slightly pressured, but I think that is more along the lines of her anxiety over the death of her brother.  I am not going to change any of her medications today.  Hopefully she will continue to improve.  I am going to go on and order a lithium level for tomorrow morning, a metabolic panel, and a TSH in case she is ready for discharge.  Otherwise no change in treatment at this point. 1.  Continue hydroxyzine 25 mg p.o. 3 times daily as needed anxiety. 2.   Continue hydroxyzine 50 mg p.o. 3 times daily standing for anxiety. 3.  Continue lithium carbonate CR 450 mg p.o. nightly for mood stability. 4.  Continue lorazepam 1 mg p.o. every 6 hours PRN a CIWA greater than 10. 5.  Continue Zyprexa 20 mg p.o. nightly for mood stability, psychosis and insomnia. 6.  Continue prazosin 2 mg p.o. nightly for previous trauma. 7.  Continue temazepam 30 mg p.o. nightly for insomnia. 8.  Continue thiamine 100 mg p.o. daily for nutritional supplementation. 9.  Continue trazodone 50 mg p.o. nightly as needed insomnia. 10.  Check TSH, metabolic panel and lithium level in a.m. tomorrow. 11.  This position planning-in progress.  Sharma Covert, MD 12/30/2018, 10:21 AM

## 2018-12-31 LAB — BASIC METABOLIC PANEL
Anion gap: 6 (ref 5–15)
BUN: 14 mg/dL (ref 6–20)
CO2: 27 mmol/L (ref 22–32)
Calcium: 9.6 mg/dL (ref 8.9–10.3)
Chloride: 107 mmol/L (ref 98–111)
Creatinine, Ser: 0.75 mg/dL (ref 0.44–1.00)
GFR calc Af Amer: >60 mL/min (ref >60)
GFR calc non Af Amer: >60 mL/min (ref >60)
Glucose, Bld: 92 mg/dL (ref 70–99)
Potassium: 3.8 mmol/L (ref 3.5–5.1)
Sodium: 140 mmol/L (ref 135–145)

## 2018-12-31 LAB — TSH: TSH: 2.382 u[IU]/mL (ref 0.350–4.500)

## 2018-12-31 LAB — LITHIUM LEVEL: Lithium Lvl: 0.35 mmol/L — ABNORMAL LOW (ref 0.60–1.20)

## 2018-12-31 MED ORDER — BENZTROPINE MESYLATE 0.5 MG PO TABS
0.5000 mg | ORAL_TABLET | ORAL | Status: DC
  Filled 2018-12-31 (×2): qty 14

## 2018-12-31 MED ORDER — OLANZAPINE 20 MG PO TABS: 20.0000 mg | ORAL_TABLET | Freq: Every day | ORAL | 1 refills | Status: AC

## 2018-12-31 MED ORDER — LITHIUM CARBONATE ER 450 MG PO TBCR: 450.0000 mg | EXTENDED_RELEASE_TABLET | Freq: Every day | ORAL | 1 refills | Status: AC

## 2018-12-31 MED ORDER — PRAZOSIN HCL 2 MG PO CAPS: 2.0000 mg | ORAL_CAPSULE | Freq: Every day | ORAL | 2 refills | Status: AC

## 2018-12-31 MED ORDER — HYDROXYZINE HCL 25 MG PO TABS: 25.0000 mg | ORAL_TABLET | Freq: Three times a day (TID) | ORAL | 2 refills | Status: AC | PRN
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
## 2018-12-31 NOTE — Progress Notes (Signed)
  [facility] Adult Case Management Discharge Plan :  Will you be returning to the same living situation after discharge:  Yes,  with friends At discharge, do you have transportation home?: Yes,  pt's mother Do you have the ability to pay for your medications: No.; [facility]  Release of information consent forms completed and in the chart;  Patient's signature needed at discharge.  Patient to Follow up at: Follow-up Information    [facility] Follow up.   Specialty:  Professional Counselor Why:  Next appointment with therapist M. Placey is scheduled for 01/01/2019 -  Medication management is also done here. Contact information: [facility] [place] [address] [phone]           Next level of care provider has access to [place] and Suicide Prevention discussed: Yes,  with pt's mother  Have you used any form of tobacco in the last 30 days? (Cigarettes, Smokeless Tobacco, Cigars, and/or Pipes): Yes  Has patient been referred to the Quitline?: Patient refused referral  Patient has been referred for addiction treatment: [place], LCSW 12/31/2018, 11:03 AM

## 2018-12-31 NOTE — BHH Suicide Risk Assessment (Signed)
[facility] INPATIENT:  Family/Significant Other Suicide Prevention Education  Suicide Prevention Education:  Education Completed; Pt's mother, [name],  has been identified by the patient as the family member/significant other with whom the patient will be residing, and identified as the person(s) who will aid the patient in the event of a mental health crisis (suicidal ideations/suicide attempt).  With written consent from the patient, the family member/significant other has been provided the following suicide prevention education, prior to the and/or following the discharge of the patient.  The suicide prevention education provided includes the following:  Suicide risk factors  Suicide prevention and interventions  National Suicide Hotline telephone number  [facility] assessment telephone number  [facility] Emergency Assistance [place] and/or Residential Mobile Crisis Unit telephone number  Request made of family/significant other to:  Remove weapons (e.g., guns, rifles, knives), all items previously/currently identified as safety concern.    Remove drugs/medications (over-the-counter, prescriptions, illicit drugs), all items previously/currently identified as a safety concern.  The family member/significant other verbalizes understanding of the suicide prevention education information provided.  The family member/significant other agrees to remove the items of safety concern listed above.   CSW contacted pt's mother, [name]. Pt's mother stated that she does not have any concerns or questions right now. Pt's mother stated that she would be picking the pt up when she was discharged and taking her back home. Pt's mother acknowledged that the pt does not have access to any weapons/guns and that she is aware of how to get the pt care if she becomes suicidal or homicidal.   Trecia Rogers 12/31/2018, 10:52 AM

## 2018-12-31 NOTE — Tx Team (Signed)
Interdisciplinary Treatment and Diagnostic Plan Update  12/31/2018 Time of Session: 9:02am  ODA PLACKE MRN: [id number]  Principal Diagnosis: <principal problem not specified>  Secondary Diagnoses: Active Problems:   MDD (major depressive disorder), recurrent, severe, with psychosis ([place])   Current Medications:  Current Facility-Administered Medications  Medication Dose Route Frequency Provider Last Rate Last Dose  . acetaminophen (TYLENOL) tablet 650 mg  650 mg Oral Q6H PRN Money, Lowry Ram, FNP      . alum & mag hydroxide-simeth (MAALOX/MYLANTA) 200-200-20 MG/5ML suspension 30 mL  30 mL Oral Q4H PRN Money, Lowry Ram, FNP      . hydrOXYzine (ATARAX/VISTARIL) tablet 25 mg  25 mg Oral TID PRN Money, Lowry Ram, FNP   25 mg at 12/29/18 2350  . hydrOXYzine (ATARAX/VISTARIL) tablet 50 mg  50 mg Oral TID Johnn Hai, MD   50 mg at 12/31/18 1154  . lithium carbonate (ESKALITH) CR tablet 450 mg  450 mg Oral QHS Johnn Hai, MD   450 mg at 12/30/18 2056  . magnesium hydroxide (MILK OF MAGNESIA) suspension 30 mL  30 mL Oral Daily PRN Money, Lowry Ram, FNP      . multivitamin with minerals tablet 1 tablet  1 tablet Oral Daily Money, Lowry Ram, FNP   1 tablet at 12/31/18 0834  . nicotine polacrilex (NICORETTE) gum 2 mg  2 mg Oral PRN Sharma Covert, MD   2 mg at 12/30/18 1540  . OLANZapine (ZYPREXA) tablet 20 mg  20 mg Oral QHS Johnn Hai, MD   20 mg at 12/30/18 2056  . prazosin (MINIPRESS) capsule 2 mg  2 mg Oral QHS Johnn Hai, MD   2 mg at 12/30/18 2055  . temazepam (RESTORIL) capsule 30 mg  30 mg Oral QHS Johnn Hai, MD   30 mg at 12/30/18 2056  . thiamine (B-1) injection 100 mg  100 mg Intramuscular Once Money, Darnelle Maffucci B, FNP      . thiamine (VITAMIN B-1) tablet 100 mg  100 mg Oral Daily Money, Lowry Ram, FNP   100 mg at 12/31/18 0834  . traZODone (DESYREL) tablet 50 mg  50 mg Oral QHS PRN Money, Lowry Ram, FNP   50 mg at 12/29/18 2350   PTA Medications: Medications Prior to Admission   Medication Sig Dispense Refill Last Dose  . hydrOXYzine (ATARAX/VISTARIL) 50 MG tablet Take 50 mg by mouth 3 (three) times daily.   Past Week at Unknown time  . lithium carbonate 300 MG capsule Take 300 mg by mouth at bedtime.     [name] OLANZapine (ZYPREXA) 15 MG tablet Take 15 mg by mouth at bedtime.   Past Week at Unknown time  . prazosin (MINIPRESS) 1 MG capsule Take 1 mg by mouth at bedtime.   Past Week at Unknown time  . benztropine (COGENTIN) 0.5 MG tablet Take 1 tablet (0.5 mg total) by mouth 2 (two) times daily in the am and at bedtime.. 60 tablet 0   . temazepam (RESTORIL) 30 MG capsule Take 1 capsule (30 mg total) by mouth at bedtime. 30 capsule 0     Patient Stressors: Loss of family  Patient Strengths: Curator fund of knowledge Supportive family/friends  Treatment Modalities: Medication Management, Group therapy, Case management,  1 to 1 session with clinician, Psychoeducation, Recreational therapy.   Physician Treatment Plan for Primary Diagnosis: <principal problem not specified> Long Term Goal(s): Improvement in symptoms so as ready for discharge Improvement in symptoms so as ready for discharge  Short Term Goals: Ability to disclose and discuss suicidal ideas Ability to maintain clinical measurements within normal limits will improve  Medication Management: Evaluate patient's response, side effects, and tolerance of medication regimen.  Therapeutic Interventions: 1 to 1 sessions, Unit Group sessions and Medication administration.  Evaluation of Outcomes: Adequate for Discharge  Physician Treatment Plan for Secondary Diagnosis: Active Problems:   MDD (major depressive disorder), recurrent, severe, with psychosis ([place])  Long Term Goal(s): Improvement in symptoms so as ready for discharge Improvement in symptoms so as ready for discharge   Short Term Goals: Ability to disclose and discuss suicidal ideas Ability to maintain clinical measurements  within normal limits will improve     Medication Management: Evaluate patient's response, side effects, and tolerance of medication regimen.  Therapeutic Interventions: 1 to 1 sessions, Unit Group sessions and Medication administration.  Evaluation of Outcomes: Adequate for Discharge   RN Treatment Plan for Primary Diagnosis: <principal problem not specified> Long Term Goal(s): Knowledge of disease and therapeutic regimen to maintain health will improve  Short Term Goals: Ability to participate in decision making will improve, Ability to verbalize feelings will improve, Ability to disclose and discuss suicidal ideas, Ability to identify and develop effective coping behaviors will improve and Compliance with prescribed medications will improve  Medication Management: RN will administer medications as ordered by provider, will assess and evaluate patient's response and provide education to patient for prescribed medication. RN will report any adverse and/or side effects to prescribing provider.  Therapeutic Interventions: 1 on 1 counseling sessions, Psychoeducation, Medication administration, Evaluate responses to treatment, Monitor vital signs and CBGs as ordered, Perform/monitor CIWA, COWS, AIMS and Fall Risk screenings as ordered, Perform wound care treatments as ordered.  Evaluation of Outcomes: Adequate for Discharge   LCSW Treatment Plan for Primary Diagnosis: <principal problem not specified> Long Term Goal(s): Safe transition to appropriate next level of care at discharge, Engage patient in therapeutic group addressing interpersonal concerns.  Short Term Goals: Engage patient in aftercare planning with referrals and resources and Increase skills for wellness and recovery  Therapeutic Interventions: Assess for all discharge needs, 1 to 1 time with Social worker, Explore available resources and support systems, Assess for adequacy in community support network, Educate family and  significant other(s) on suicide prevention, Complete Psychosocial Assessment, Interpersonal group therapy.  Evaluation of Outcomes: Adequate for Discharge   Progress in Treatment: Attending groups: No. Participating in groups: No. Taking medication as prescribed: Yes. Toleration medication: Yes. Family/Significant other contact made: Yes, individual(s) contacted:  with pt's mother Patient understands diagnosis: Yes. Discussing patient identified problems/goals with staff: Yes. Medical problems stabilized or resolved: Yes. Denies suicidal/homicidal ideation: Yes. Issues/concerns per patient self-inventory: No. Other:   New problem(s) identified: No, Describe:  None  New Short Term/Long Term Goal(s):  Patient Goals:  "To get over my brother's death"  Discharge Plan or Barriers: pt is discharging today  Reason for Continuation of Hospitalization: Pt is discharging today  Estimated Length of Stay: Pt is discharging today  Attendees: Patient: 12/31/2018   Physician: Dr. Johnn Hai, MD 12/31/2018   Nursing: Lowella Grip, RN 12/31/2018   RN Care Manager: 12/31/2018   Social Worker: Ardelle Anton, LCSW 12/31/2018  Recreational Therapist:  12/31/2018  Other:  12/31/2018   Other:  12/31/2018   Other: 12/31/2018     Scribe for Treatment Team: Trecia Rogers, LCSW 12/31/2018 12:44 PM

## 2018-12-31 NOTE — Progress Notes (Signed)
Patient ID: [name], female   DOB: March 14, 1971, 48 y.o.   MRN: [id number] Patient was in a bright mood upon discharge.  Patient denies SI, HI and AVH.   Patient acknowledged understanding of all discharge instructions and receipt of all personal belongings.  Patient discharged to home/self care.

## 2018-12-31 NOTE — BHH Suicide Risk Assessment (Signed)
[facility] Discharge Suicide Risk Assessment   Principal Problem: Exacerbation of underlying bipolar condition brought about by death of brother Discharge Diagnoses: Active Problems:   MDD (major depressive disorder), recurrent, severe, with psychosis ([place])   Total Time spent with patient: 1 hour  Musculoskeletal: Strength & Muscle Tone: within normal limits Gait & Station: normal Patient leans: N/A  Psychiatric Specialty Exam: ROS  Blood pressure 118/83, pulse (!) 101, temperature 97.9 F (36.6 C), temperature source Oral, resp. rate 18, height 5\' 1"  (1.549 m), SpO2 100 %.Body mass index is 30.23 kg/m.  General Appearance: Casual  Eye Contact::  Good  Speech:  Clear and Coherent and Pressured409  Volume:  Increased  Mood:  Euthymic  Affect:  Congruent  Thought Process:  Coherent and Descriptions of Associations: Intact  Orientation:  Full (Time, Place, and Person)  Thought Content:  Rumination  Suicidal Thoughts:  No  Homicidal Thoughts:  No  Memory:  Immediate;   Fair  Judgement:  Intact  Insight:  Good  Psychomotor Activity:  Normal  Concentration:  Good  Recall:  Good  Fund of Knowledge:Good  Language: Good  Akathisia:  Negative  Handed:  Right  AIMS (if indicated):     Assets:  Communication Skills Desire for Improvement Housing Leisure Time Physical Health Resilience  Sleep:  Number of Hours: 6.5  Cognition: WNL  ADL's:  Intact   Mental Status Per Nursing Assessment::   On Admission:  Suicidal ideation indicated by patient  Demographic Factors:  NA  Loss Factors: NA  Historical Factors: NA  Risk Reduction Factors:   Employed  Continued Clinical Symptoms:  Bipolar Disorder:   Mixed State  Cognitive Features That Contribute To Risk:  None    Suicide Risk:  Minimal: No identifiable suicidal ideation.  Patients presenting with no risk factors but with morbid ruminations; may be classified as minimal risk based on the severity of the depressive  symptoms  Follow-up [facility] Follow up.   Specialty:  Professional Counselor Why:  Next appointment with therapist M. Placey is scheduled for 01/01/2019 - needs to be rescheduled if patient not discharged in time.  Medication management is also done here. Contact information: [facility] [place] [address] [phone]           Plan Of Care/Follow-up recommendations:  Activity:  full  Gerardo Caiazzo, MD 12/31/2018, 9:08 AM

## 2018-12-31 NOTE — Progress Notes (Signed)
Recreation Therapy Notes  Date: 12/31/2018 Time: 10:00 am Location: [address]   Group Topic: Stress Exploration  Goal Area(s) Addresses:  Patient will work on worksheet on Stress Exploration. Patient will follow directions on first prompt.  Behavioral Response: Appropriate  Intervention: Worksheet  Activity:  Staff on [address] were provided with a worksheet on Stress Exploration. Staff was instructed to give it to the patients and have them work on it in place of [place]. Staff was also given 3 coloring sheets and were given the option to give them out.  Education:  Ability to follow Directions, Change of thought processes Discharge Planning.   Education Outcome: Acknowledges education/In group clarification offered  Clinical Observations/Feedback: . Due to COVID-19, guidelines group was not held. Group members were provided a learning activity worksheet to work on the topic and above-stated goals. LRT is available to answer any questions patient may have regarding the worksheet.  Tomi Likens, LRT/CTRS         Edyn Qazi L Minor Iden 12/31/2018 1:12 PM

## 2019-03-04 ENCOUNTER — Other Ambulatory Visit: Payer: Self-pay

## 2019-03-04 ENCOUNTER — Emergency Department (HOSPITAL_COMMUNITY)
Admission: EM | Admit: 2019-03-04 | Discharge: 2019-03-04 | Disposition: A | Discharge status: Home / Self Care | Payer: Self-pay | Attending: Emergency Medicine | Admitting: Emergency Medicine

## 2019-03-04 ENCOUNTER — Encounter (HOSPITAL_COMMUNITY): Payer: Self-pay

## 2019-03-04 ENCOUNTER — Emergency Department (HOSPITAL_COMMUNITY): Payer: Self-pay

## 2019-03-04 DIAGNOSIS — I10 Essential (primary) hypertension: Secondary | ICD-10-CM | POA: Insufficient documentation

## 2019-03-04 DIAGNOSIS — W1839XA Other fall on same level, initial encounter: Secondary | ICD-10-CM | POA: Insufficient documentation

## 2019-03-04 DIAGNOSIS — Y929 Unspecified place or not applicable: Secondary | ICD-10-CM | POA: Insufficient documentation

## 2019-03-04 DIAGNOSIS — Z79899 Other long term (current) drug therapy: Secondary | ICD-10-CM | POA: Insufficient documentation

## 2019-03-04 DIAGNOSIS — Y9389 Activity, other specified: Secondary | ICD-10-CM | POA: Insufficient documentation

## 2019-03-04 DIAGNOSIS — Y999 Unspecified external cause status: Secondary | ICD-10-CM | POA: Insufficient documentation

## 2019-03-04 DIAGNOSIS — F17210 Nicotine dependence, cigarettes, uncomplicated: Secondary | ICD-10-CM | POA: Insufficient documentation

## 2019-03-04 DIAGNOSIS — M545 Low back pain, unspecified: Secondary | ICD-10-CM

## 2019-03-04 DIAGNOSIS — M25561 Pain in right knee: Secondary | ICD-10-CM | POA: Insufficient documentation

## 2019-03-04 DIAGNOSIS — S7001XA Contusion of right hip, initial encounter: Secondary | ICD-10-CM | POA: Insufficient documentation

## 2019-03-04 MED ORDER — ACETAMINOPHEN 500 MG PO TABS
1000.0000 mg | ORAL_TABLET | Freq: Once | ORAL | Status: DC
  Filled 2019-03-04: qty 2

## 2019-03-04 MED ORDER — METHOCARBAMOL 500 MG PO TABS: 1000.0000 mg | ORAL_TABLET | Freq: Four times a day (QID) | ORAL | 0 refills | Status: AC

## 2019-03-04 MED ORDER — OXYCODONE HCL 5 MG PO TABS
5.0000 mg | ORAL_TABLET | Freq: Once | ORAL | Status: AC
  Administered 2019-03-04: 5 mg via ORAL
  Filled 2019-03-04: qty 1

## 2019-03-04 MED ORDER — MELOXICAM 7.5 MG PO TABS: 7.5000 mg | ORAL_TABLET | Freq: Every day | ORAL | 0 refills | Status: AC

## 2019-03-04 NOTE — ED Triage Notes (Signed)
Pt from home w/ a c/o right knee and right hip pain that she sustained from a fall. She has had knee pain for ~2 weeks and states her knee "gave out" on her. She landed onto a tiled floor. Pt states her knee has been "popping" recently. Denies LOC. No head, neck, or back pain. Pt was able to stand with the assistance of a cane, but could not support a lot of her weight. PMS intact.

## 2019-03-04 NOTE — Discharge Instructions (Signed)
Please read and follow all provided instructions.  Your diagnoses today include:  1. Contusion of right hip, initial encounter   2. Acute pain of right knee   3. Acute bilateral low back pain without sciatica     Tests performed today include:  Vital signs - see below for your results today  X-rays of your knee, lower back, and hip -no broken bones, you have some fluid in the right knee joint  Medications prescribed:   Meloxicam - anti-inflammatory pain medication  You have been prescribed an anti-inflammatory medication or NSAID. Take with food. Do not take aspirin, ibuprofen, or naproxen if taking this medication. Take smallest effective dose for the shortest duration needed for your pain. Stop taking if you experience stomach pain or vomiting.    Robaxin (methocarbamol) - muscle relaxer medication  DO NOT drive or perform any activities that require you to be awake and alert because this medicine can make you drowsy.   Take any prescribed medications only as directed.  Home care instructions:   Follow any educational materials contained in this packet  Please rest, use ice or heat on your back for the next several days  Do not lift, push, pull anything more than 10 pounds for the next week  Follow-up instructions: Please follow-up with your primary care provider in the next 1 week for further evaluation of your symptoms.   Return instructions:  SEEK IMMEDIATE MEDICAL ATTENTION IF YOU HAVE:  New numbness, tingling, weakness, or problem with the use of your arms or legs  Severe back pain not relieved with medications  Loss control of your bowels or bladder  Increasing pain in any areas of the body (such as chest or abdominal pain)  Shortness of breath, dizziness, or fainting.   Worsening nausea (feeling sick to your stomach), vomiting, fever, or sweats  Any other emergent concerns regarding your health   Additional Information:  Your vital signs today  were: BP 115/83 (BP Location: Right Arm)    Pulse 90    Temp 98.7 F (37.1 C) (Oral)    Resp 16    LMP  (LMP Unknown) Comment: Patient signed pregnancy test waiver for her xrays.   SpO2 100%  If your blood pressure (BP) was elevated above 135/85 this visit, please have this repeated by your doctor within one month. --------------

## 2019-03-04 NOTE — ED Provider Notes (Signed)
[facility] EMERGENCY DEPARTMENT Provider Note   CSN: [id number] Arrival date & time: 03/04/19  1025     History   Chief Complaint Chief Complaint  Patient presents with  . Knee Pain  . Hip Pain    HPI [name] is a 48 y.o. female.     Patient with history of anxiety and depression presents to the emergency department after a fall onto her right side occurring yesterday.  Patient reports that she has chronic pain issues with her right knee.  Yesterday she was working carrying a heavy pot and her knee gave out causing her to fall onto her right side and landed on the floor.  Denies hitting her head.  Patient reports chronic popping and clicking in her right knee.  She currently has pain in her lower back, her right hip and swelling and pain in her right knee.  Patient has been ambulatory but with a cane.  She has intermittent "weakness" in her right knee however no numbness or tingling.  She has been taking a lot of ibuprofen without relief.     Past Medical History:  Diagnosis Date  . Anxiety   . Depression   . Hepatitis   . Hypertension     Patient Active Problem List   Diagnosis Date Noted  . MDD (major depressive disorder), recurrent, severe, with psychosis ([place]) 12/28/2018  . Cannabis use disorder, severe, dependence ([place])   . Bipolar affective disorder, depressed, severe, with psychotic behavior ([place]) 06/09/2014    Class: Acute  . Bipolar 1 disorder, depressed, severe ([place]) 06/09/2014  . Bipolar I disorder, most recent episode (or current) manic ([place]) 05/06/2013  . Polysubstance abuse ([place]) 05/06/2013  . BIPOLAR AFFECTIVE DISORDER 08/11/2010  . HEPATITIS B CARRIER 08/11/2010    History reviewed. No pertinent surgical history.   OB History    Gravida  4   Para  3   Term  3   Preterm      AB  1   Living  2     SAB  0   TAB      Ectopic  1   Multiple      Live Births  3            Home Medications    Prior to  Admission medications   Medication Sig Start Date End Date Taking? Authorizing Provider  benztropine (COGENTIN) 0.5 MG tablet Take 1 tablet (0.5 mg total) by mouth 2 (two) times daily in the am and at bedtime.[name] 06/18/14  Yes Trula Ore, NP  hydrOXYzine (ATARAX/VISTARIL) 25 MG tablet Take 1 tablet (25 mg total) by mouth 3 (three) times daily as needed for anxiety. 12/31/18  Yes Johnn Hai, MD  lithium carbonate (ESKALITH) 450 MG CR tablet Take 1 tablet (450 mg total) by mouth at bedtime. 12/31/18  Yes Johnn Hai, MD  OLANZapine (ZYPREXA) 20 MG tablet Take 1 tablet (20 mg total) by mouth at bedtime. 12/31/18  Yes Johnn Hai, MD  prazosin (MINIPRESS) 2 MG capsule Take 1 capsule (2 mg total) by mouth at bedtime. 12/31/18  Yes Johnn Hai, MD  lurasidone (LATUDA) 80 MG TABS Take 80 mg by mouth daily with breakfast.    10/03/11  [provider]  sertraline (ZOLOFT) 50 MG tablet Take 50 mg by mouth daily.    10/03/11  [provider]    Family History Family History  Problem Relation Age of Onset  . Depression Other  Social History Social History   Tobacco Use  . Smoking status: Current Every Day Smoker    Packs/day: 0.50    Types: Cigarettes  . Smokeless tobacco: Never Used  Substance Use Topics  . Alcohol use: Yes    Comment: occ   . Drug use: Yes    Types: Marijuana, Cocaine    Comment: daily     Allergies   Patient has no known allergies.   Review of Systems Review of Systems  Constitutional: Negative for activity change.  Musculoskeletal: Positive for arthralgias, back pain, gait problem, joint swelling and myalgias. Negative for neck pain.  Skin: Negative for wound.  Neurological: Positive for weakness (knee gives out). Negative for numbness.     Physical Exam Updated Vital Signs BP 115/83 (BP Location: Right Arm)   Pulse 90   Temp 98.7 F (37.1 C) (Oral)   Resp 16   SpO2 100%   Physical Exam Vitals signs and nursing note reviewed.   Constitutional:      Appearance: She is well-developed.  HENT:     Head: Normocephalic and atraumatic.  Eyes:     Conjunctiva/sclera: Conjunctivae normal.  Neck:     Musculoskeletal: Normal range of motion and neck supple.  Cardiovascular:     Pulses:          Dorsalis pedis pulses are 2+ on the right side and 2+ on the left side.  Pulmonary:     Effort: No respiratory distress.  Musculoskeletal:     Right hip: She exhibits tenderness. She exhibits normal range of motion, no bony tenderness and no swelling.     Right knee: She exhibits decreased range of motion, swelling and effusion. Tenderness found. Medial joint line and lateral joint line tenderness noted.     Right ankle: Normal.     Cervical back: She exhibits normal range of motion, no tenderness and no bony tenderness.     Thoracic back: She exhibits normal range of motion, no tenderness and no bony tenderness.     Lumbar back: She exhibits tenderness. She exhibits normal range of motion and no bony tenderness.     Right upper leg: Normal.     Right lower leg: Normal.  Skin:    General: Skin is warm and dry.  Neurological:     Mental Status: She is alert.      ED Treatments / Results  Labs (all labs ordered are listed, but only abnormal results are displayed) Labs Reviewed - No data to display  EKG None  Radiology Dg Lumbar Spine Complete  Result Date: 03/04/2019 CLINICAL DATA:  Fall.  Back pain EXAM: LUMBAR SPINE - COMPLETE 4+ VIEW COMPARISON:  None. FINDINGS: There is no evidence of lumbar spine fracture. Alignment is normal. Intervertebral disc spaces are maintained. IMPRESSION: Negative. Electronically Signed   By: Franchot Gallo M.D.   On: 03/04/2019 21:35   Dg Knee Complete 4 Views Right  Result Date: 03/04/2019 CLINICAL DATA:  Fall.  Knee pain EXAM: RIGHT KNEE - COMPLETE 4+ VIEW COMPARISON:  None. FINDINGS: Small joint effusion.  Negative for fracture or degenerative change. IMPRESSION: Negative for  fracture.  Small joint effusion. Electronically Signed   By: Franchot Gallo M.D.   On: 03/04/2019 21:36   Dg Hip Unilat W Or Wo Pelvis 2-3 Views Right  Result Date: 03/04/2019 CLINICAL DATA:  Fall.  Right hip pain EXAM: DG HIP (WITH OR WITHOUT PELVIS) 2-3V RIGHT COMPARISON:  None. FINDINGS: There is no evidence of hip  fracture or dislocation. There is no evidence of arthropathy or other focal bone abnormality. IMPRESSION: Negative. Electronically Signed   By: Franchot Gallo M.D.   On: 03/04/2019 21:36    Procedures Procedures (including critical care time)  Medications Ordered in ED Medications  oxyCODONE (Oxy IR/ROXICODONE) immediate release tablet 5 mg (5 mg Oral Given 03/04/19 2045)     Initial Impression / Assessment and Plan / ED Course  I have reviewed the triage vital signs and the nursing notes.  Pertinent labs & imaging results that were available during my care of the patient were reviewed by me and considered in my medical decision making (see chart for details).        Patient seen and examined.  X-rays ordered.  Suspect musculoskeletal injury.  Vital signs reviewed and are as follows: BP 122/79 (BP Location: Right Arm)   Pulse 70   Temp 98.7 F (37.1 C) (Oral)   Resp 16   LMP  (LMP Unknown) Comment: Patient signed pregnancy test waiver for her xrays.  SpO2 100%   X-rays were negative with the exception of a small joint effusion in her right knee which is consistent with exam.  Lower extremities neurovascularly intact.  No red flags of back pain.  Patient is ambulatory.  No indication for MRI at this point.  Patient will be treated with Robaxin and meloxicam.  Encourage PCP follow-up/orthopedic follow-up.  Encouraged return if worsening.  Patient counseled on proper use of muscle relaxant medication.  They were told not to drink alcohol, drive any vehicle, or do any dangerous activities while taking this medication.  Patient verbalized understanding.   Final  Clinical Impressions(s) / ED Diagnoses   Final diagnoses:  Contusion of right hip, initial encounter  Acute pain of right knee  Acute bilateral low back pain without sciatica   Patient presents with musculoskeletal pain after a fall likely related to chronic issues with her right knee.  Imaging of the knee, hip, and lumbar spine today are negative.  Patient without any neurological deficits.  At this point, conservative measures indicated with follow-up as above.  ED Discharge Orders    None       Carlisle Cater, Hershal Coria 03/04/19 2302    Lacretia Leigh, MD 03/05/19 1439

## 2019-03-04 NOTE — ED Notes (Signed)
Patient verbalizes understanding of discharge instructions. Opportunity for questioning and answers were provided. Armband removed by staff, pt discharged from ED.  

## 2019-03-04 NOTE — ED Notes (Signed)
Patient transported to X-ray 

## 2019-08-16 ENCOUNTER — Other Ambulatory Visit: Payer: Self-pay

## 2019-08-16 ENCOUNTER — Encounter (HOSPITAL_COMMUNITY): Payer: Self-pay | Admitting: Emergency Medicine

## 2019-08-16 ENCOUNTER — Emergency Department (HOSPITAL_COMMUNITY)
Admission: EM | Admit: 2019-08-16 | Discharge: 2019-08-16 | Disposition: A | Discharge status: Home / Self Care | Payer: No Typology Code available for payment source | Attending: Emergency Medicine | Admitting: Emergency Medicine

## 2019-08-16 DIAGNOSIS — I10 Essential (primary) hypertension: Secondary | ICD-10-CM | POA: Insufficient documentation

## 2019-08-16 DIAGNOSIS — F17210 Nicotine dependence, cigarettes, uncomplicated: Secondary | ICD-10-CM | POA: Insufficient documentation

## 2019-08-16 DIAGNOSIS — Z79899 Other long term (current) drug therapy: Secondary | ICD-10-CM | POA: Insufficient documentation

## 2019-08-16 DIAGNOSIS — F319 Bipolar disorder, unspecified: Secondary | ICD-10-CM | POA: Insufficient documentation

## 2019-08-16 DIAGNOSIS — Z711 Person with feared health complaint in whom no diagnosis is made: Secondary | ICD-10-CM

## 2019-08-16 DIAGNOSIS — Z202 Contact with and (suspected) exposure to infections with a predominantly sexual mode of transmission: Secondary | ICD-10-CM | POA: Diagnosis present

## 2019-08-16 DIAGNOSIS — N760 Acute vaginitis: Secondary | ICD-10-CM | POA: Diagnosis not present

## 2019-08-16 DIAGNOSIS — B9689 Other specified bacterial agents as the cause of diseases classified elsewhere: Secondary | ICD-10-CM | POA: Insufficient documentation

## 2019-08-16 DIAGNOSIS — A599 Trichomoniasis, unspecified: Secondary | ICD-10-CM | POA: Diagnosis not present

## 2019-08-16 LAB — URINALYSIS, ROUTINE W REFLEX MICROSCOPIC
Bilirubin Urine: NEGATIVE
Glucose, UA: NEGATIVE mg/dL
Hgb urine dipstick: NEGATIVE
Ketones, ur: NEGATIVE mg/dL
Nitrite: NEGATIVE
Protein, ur: NEGATIVE mg/dL
Specific Gravity, Urine: 1.019 (ref 1.005–1.030)
pH: 5 (ref 5.0–8.0)

## 2019-08-16 LAB — RAPID HIV SCREEN (HIV 1/2 AB+AG)
HIV 1/2 Antibodies: NONREACTIVE
HIV-1 P24 Antigen - HIV24: NONREACTIVE

## 2019-08-16 LAB — WET PREP, GENITAL
Sperm: NONE SEEN
Yeast Wet Prep HPF POC: NONE SEEN

## 2019-08-16 LAB — PREGNANCY, URINE: Preg Test, Ur: NEGATIVE

## 2019-08-16 MED ORDER — CEFTRIAXONE SODIUM 500 MG IJ SOLR
250.0000 mg | Freq: Once | INTRAMUSCULAR | Status: AC
  Administered 2019-08-16: 250 mg via INTRAMUSCULAR
  Filled 2019-08-16: qty 500

## 2019-08-16 MED ORDER — METRONIDAZOLE 500 MG PO TABS
2000.0000 mg | ORAL_TABLET | Freq: Once | ORAL | Status: AC
  Administered 2019-08-16: 2000 mg via ORAL
  Filled 2019-08-16: qty 4

## 2019-08-16 MED ORDER — METRONIDAZOLE 500 MG PO TABS: 500.0000 mg | ORAL_TABLET | Freq: Two times a day (BID) | ORAL | 0 refills | Status: AC

## 2019-08-16 MED ORDER — METRONIDAZOLE 500 MG PO TABS: 500.0000 mg | ORAL_TABLET | Freq: Two times a day (BID) | ORAL | 0 refills | Status: DC

## 2019-08-16 MED ORDER — AZITHROMYCIN 250 MG PO TABS
1000.0000 mg | ORAL_TABLET | Freq: Once | ORAL | Status: AC
  Administered 2019-08-16: 1000 mg via ORAL
  Filled 2019-08-16: qty 4

## 2019-08-16 NOTE — ED Provider Notes (Signed)
[facility] EMERGENCY DEPARTMENT Provider Note   CSN: [id number] Arrival date & time: 08/16/19  1443     History Chief Complaint  Patient presents with  . Exposure to STD    [name] is a 49 y.o. female with a past medical history of hypertension presenting to the ED with a chief complaint of vaginal discharge.  States that on 08/12/2019 patient had unprotected sexual intercourse with a female friend and was told that he tested positive for trichomonas.  She began having discharge 2 days ago.  She admits that this was a "one night stand" and that she just got out of a 4 year relationship and "wanted to let my hair down and then he tells me he gave me something. I just wanna get treated for all of it."  Reports lower abdominal pressure but denies any dysuria, abnormal vaginal bleeding, possibility of pregnancy.  HPI     Past Medical History:  Diagnosis Date  . Anxiety   . Depression   . Hepatitis   . Hypertension     Patient Active Problem List   Diagnosis Date Noted  . MDD (major depressive disorder), recurrent, severe, with psychosis ([place]) 12/28/2018  . Cannabis use disorder, severe, dependence ([place])   . Bipolar affective disorder, depressed, severe, with psychotic behavior ([place]) 06/09/2014    Class: Acute  . Bipolar 1 disorder, depressed, severe ([place]) 06/09/2014  . Bipolar I disorder, most recent episode (or current) manic ([place]) 05/06/2013  . Polysubstance abuse ([place]) 05/06/2013  . BIPOLAR AFFECTIVE DISORDER 08/11/2010  . HEPATITIS B CARRIER 08/11/2010    History reviewed. No pertinent surgical history.   OB History    Gravida  4   Para  3   Term  3   Preterm      AB  1   Living  2     SAB  0   TAB      Ectopic  1   Multiple      Live Births  3           Family History  Problem Relation Age of Onset  . Depression Other     Social History   Tobacco Use  . Smoking status: Current Every Day Smoker    Packs/day: 0.50      Types: Cigarettes  . Smokeless tobacco: Never Used  Substance Use Topics  . Alcohol use: Yes    Comment: occ   . Drug use: Yes    Types: Marijuana, Cocaine    Comment: daily    Home Medications Prior to Admission medications   Medication Sig Start Date End Date Taking? Authorizing Provider  benztropine (COGENTIN) 0.5 MG tablet Take 1 tablet (0.5 mg total) by mouth 2 (two) times daily in the am and at bedtime.[name] 06/18/14   Trula Ore, NP  hydrOXYzine (ATARAX/VISTARIL) 25 MG tablet Take 1 tablet (25 mg total) by mouth 3 (three) times daily as needed for anxiety. 12/31/18   Johnn Hai, MD  lithium carbonate (ESKALITH) 450 MG CR tablet Take 1 tablet (450 mg total) by mouth at bedtime. 12/31/18   Johnn Hai, MD  meloxicam (MOBIC) 7.5 MG tablet Take 1 tablet (7.5 mg total) by mouth daily. 03/04/19   Carlisle Cater, PA-C  methocarbamol (ROBAXIN) 500 MG tablet Take 2 tablets (1,000 mg total) by mouth 4 (four) times daily. 03/04/19   Carlisle Cater, PA-C  metroNIDAZOLE (FLAGYL) 500 MG tablet Take 1 tablet (500 mg total) by mouth  2 (two) times daily. 08/16/19   Laquitha Heslin, PA-C  OLANZapine (ZYPREXA) 20 MG tablet Take 1 tablet (20 mg total) by mouth at bedtime. 12/31/18   Johnn Hai, MD  prazosin (MINIPRESS) 2 MG capsule Take 1 capsule (2 mg total) by mouth at bedtime. 12/31/18   Johnn Hai, MD  lurasidone (LATUDA) 80 MG TABS Take 80 mg by mouth daily with breakfast.    10/03/11  [provider]  sertraline (ZOLOFT) 50 MG tablet Take 50 mg by mouth daily.    10/03/11  [provider]    Allergies    Patient has no known allergies.  Review of Systems   Review of Systems  Constitutional: Negative for appetite change, chills and fever.  HENT: Negative for ear pain, rhinorrhea, sneezing and sore throat.   Eyes: Negative for photophobia and visual disturbance.  Respiratory: Negative for cough, chest tightness, shortness of breath and wheezing.   Cardiovascular: Negative for  chest pain and palpitations.  Gastrointestinal: Negative for abdominal pain, blood in stool, constipation, diarrhea, nausea and vomiting.  Genitourinary: Positive for pelvic pain and vaginal discharge. Negative for dysuria, hematuria and urgency.  Musculoskeletal: Negative for myalgias.  Skin: Negative for rash.  Neurological: Negative for dizziness, weakness and light-headedness.    Physical Exam Updated Vital Signs BP 115/78 (BP Location: Right Arm)   Pulse 83   Temp 98.2 F (36.8 C) (Oral)   Resp 14   SpO2 100%   Physical Exam Vitals and nursing note reviewed.  Constitutional:      General: She is not in acute distress.    Appearance: She is well-developed.  HENT:     Head: Normocephalic and atraumatic.     Nose: Nose normal.  Eyes:     General: No scleral icterus.       Left eye: No discharge.     Conjunctiva/sclera: Conjunctivae normal.  Cardiovascular:     Rate and Rhythm: Normal rate and regular rhythm.     Heart sounds: Normal heart sounds. No murmur. No friction rub. No gallop.   Pulmonary:     Effort: Pulmonary effort is normal. No respiratory distress.     Breath sounds: Normal breath sounds.  Abdominal:     General: Bowel sounds are normal. There is no distension.     Palpations: Abdomen is soft.     Tenderness: There is no abdominal tenderness. There is no guarding.  Genitourinary:    Cervix: No cervical motion tenderness.     Uterus: Not tender.      Adnexa:        Right: No tenderness.         Left: No tenderness.       Comments: Pelvic exam: normal external genitalia without evidence of trauma. VULVA: normal appearing vulva with no masses, tenderness or lesion. VAGINA: normal appearing vagina with normal color and white discharge, no lesions. CERVIX: normal appearing cervix without lesions, cervical motion tenderness absent, cervical os closed with out purulent discharge; No vaginal discharge. Wet prep and DNA probe for chlamydia and GC obtained.     ADNEXA: normal adnexa in size, nontender and no masses UTERUS: uterus is normal size, shape, consistency and nontender.   Musculoskeletal:        General: Normal range of motion.     Cervical back: Normal range of motion and neck supple.  Skin:    General: Skin is warm and dry.     Findings: No rash.  Neurological:  Mental Status: She is alert.     Motor: No abnormal muscle tone.     Coordination: Coordination normal.     ED Results / Procedures / Treatments   Labs (all labs ordered are listed, but only abnormal results are displayed) Labs Reviewed  WET PREP, GENITAL - Abnormal; Notable for the following components:      Result Value   Trich, Wet Prep PRESENT (*)    Clue Cells Wet Prep HPF POC PRESENT (*)    WBC, Wet Prep HPF POC MODERATE (*)    All other components within normal limits  URINALYSIS, ROUTINE W REFLEX MICROSCOPIC - Abnormal; Notable for the following components:   APPearance HAZY (*)    Leukocytes,Ua SMALL (*)    Bacteria, UA RARE (*)    All other components within normal limits  PREGNANCY, URINE  RAPID HIV SCREEN (HIV 1/2 AB+AG)  RPR  GC/CHLAMYDIA PROBE AMP ([place]) NOT AT [facility]    EKG None  Radiology No results found.  Procedures Procedures (including critical care time)  Medications Ordered in ED Medications  cefTRIAXone (ROCEPHIN) injection 250 mg (250 mg Intramuscular Given 08/16/19 1833)  azithromycin (ZITHROMAX) tablet 1,000 mg (1,000 mg Oral Given 08/16/19 1826)  metroNIDAZOLE (FLAGYL) tablet 2,000 mg (2,000 mg Oral Given 08/16/19 1826)    ED Course  I have reviewed the triage vital signs and the nursing notes.  Pertinent labs & imaging results that were available during my care of the patient were reviewed by me and considered in my medical decision making (see chart for details).    MDM Rules/Calculators/A&P                      49 year old female presenting to the ED for vaginal discharge and concern for STDs.  States that  female partner that she had unprotected sexual intercourse with 4 days ago tested positive for trichomonas.  She would like to be tested and treated for the same as well as other STDs.  Pelvic exam revealed white vaginal discharge without cervical motion tenderness, adnexal tenderness or uterine tenderness.  Will treat with Flagyl, azithromycin and Rocephin and await results of STD panel.  Urinalysis is unremarkable.  Wet prep positive for trichomonas and clue cells.  Will have patient follow-up with remainder of lab work.  Urine pregnancy test is negative.  Patient is hemodynamically stable, in NAD, and able to ambulate in the ED. Evaluation does not show pathology that would require ongoing emergent intervention or inpatient treatment. I explained the diagnosis to the patient. Pain has been managed and has no complaints prior to discharge. Patient is comfortable with above plan and is stable for discharge at this time. All questions were answered prior to disposition. Strict return precautions for returning to the ED were discussed. Encouraged follow up with PCP.   An After Visit Summary was printed and given to the patient.   Portions of this note were generated with [employer]. Dictation errors may occur despite best attempts at proofreading.  Final Clinical Impression(s) / ED Diagnoses Final diagnoses:  Concern about STD in female without diagnosis  Trichomonas infection  Bacterial vaginosis    Rx / DC Orders ED Discharge Orders         Ordered    metroNIDAZOLE (FLAGYL) 500 MG tablet  2 times daily     08/16/19 1848           Delia Heady, PA-C 08/16/19 1849    [name]  Hsienta, MD 08/16/19 2231

## 2019-08-16 NOTE — Discharge Instructions (Addendum)
We will contact you with the results of your remaining lab work when it is available. Return to the ED if you start to have worsening symptoms, worsening abdominal pain, develop a fever, abnormal bleeding.

## 2019-08-16 NOTE — ED Notes (Signed)
Patient verbalizes understanding of discharge instructions. Opportunity for questioning and answers were provided.  pt discharged from ED, ambulatory by self   

## 2019-08-16 NOTE — ED Triage Notes (Signed)
Pt here from home with c/o some lower abd pain and some vaginal discharge , pt states that she would like to be checked for a STD

## 2019-08-17 LAB — RPR: RPR Ser Ql: NONREACTIVE

## 2019-08-19 LAB — GC/CHLAMYDIA PROBE AMP (~~LOC~~) NOT AT ARMC
Chlamydia: NEGATIVE
Neisseria Gonorrhea: NEGATIVE

## 2019-12-07 ENCOUNTER — Emergency Department (HOSPITAL_COMMUNITY): Payer: Self-pay

## 2019-12-07 ENCOUNTER — Emergency Department (HOSPITAL_COMMUNITY)
Admission: EM | Admit: 2019-12-07 | Discharge: 2019-12-07 | Disposition: A | Discharge status: Home / Self Care | Payer: Self-pay | Attending: Emergency Medicine | Admitting: Emergency Medicine

## 2019-12-07 ENCOUNTER — Encounter (HOSPITAL_COMMUNITY): Payer: Self-pay | Admitting: Emergency Medicine

## 2019-12-07 ENCOUNTER — Other Ambulatory Visit: Payer: Self-pay

## 2019-12-07 DIAGNOSIS — I10 Essential (primary) hypertension: Secondary | ICD-10-CM | POA: Insufficient documentation

## 2019-12-07 DIAGNOSIS — F1410 Cocaine abuse, uncomplicated: Secondary | ICD-10-CM | POA: Insufficient documentation

## 2019-12-07 DIAGNOSIS — R1084 Generalized abdominal pain: Secondary | ICD-10-CM | POA: Insufficient documentation

## 2019-12-07 DIAGNOSIS — K5900 Constipation, unspecified: Secondary | ICD-10-CM | POA: Insufficient documentation

## 2019-12-07 DIAGNOSIS — Z79899 Other long term (current) drug therapy: Secondary | ICD-10-CM | POA: Insufficient documentation

## 2019-12-07 DIAGNOSIS — F1210 Cannabis abuse, uncomplicated: Secondary | ICD-10-CM | POA: Insufficient documentation

## 2019-12-07 DIAGNOSIS — F17210 Nicotine dependence, cigarettes, uncomplicated: Secondary | ICD-10-CM | POA: Insufficient documentation

## 2019-12-07 LAB — COMPREHENSIVE METABOLIC PANEL
ALT: 19 U/L (ref 0–44)
AST: 23 U/L (ref 15–41)
Albumin: 4.6 g/dL (ref 3.5–5.0)
Alkaline Phosphatase: 55 U/L (ref 38–126)
Anion gap: 9 (ref 5–15)
BUN: 13 mg/dL (ref 6–20)
CO2: 27 mmol/L (ref 22–32)
Calcium: 9 mg/dL (ref 8.9–10.3)
Chloride: 102 mmol/L (ref 98–111)
Creatinine, Ser: 0.69 mg/dL (ref 0.44–1.00)
GFR calc Af Amer: >60 mL/min (ref >60)
GFR calc non Af Amer: >60 mL/min (ref >60)
Glucose, Bld: 87 mg/dL (ref 70–99)
Potassium: 3.6 mmol/L (ref 3.5–5.1)
Sodium: 138 mmol/L (ref 135–145)
Total Bilirubin: 0.8 mg/dL (ref 0.3–1.2)
Total Protein: 7.8 g/dL (ref 6.5–8.1)

## 2019-12-07 LAB — URINALYSIS, ROUTINE W REFLEX MICROSCOPIC
Bilirubin Urine: NEGATIVE
Glucose, UA: NEGATIVE mg/dL
Ketones, ur: 5 mg/dL — AB
Nitrite: NEGATIVE
Protein, ur: NEGATIVE mg/dL
Specific Gravity, Urine: 1.015 (ref 1.005–1.030)
pH: 6 (ref 5.0–8.0)

## 2019-12-07 LAB — CBC
HCT: 43.8 % (ref 36.0–46.0)
Hemoglobin: 15 g/dL (ref 12.0–15.0)
MCH: 36 pg — ABNORMAL HIGH (ref 26.0–34.0)
MCHC: 34.2 g/dL (ref 30.0–36.0)
MCV: 105 fL — ABNORMAL HIGH (ref 80.0–100.0)
Platelets: 183 10*3/uL (ref 150–400)
RBC: 4.17 MIL/uL (ref 3.87–5.11)
RDW: 12.8 % (ref 11.5–15.5)
WBC: 5.6 10*3/uL (ref 4.0–10.5)
nRBC: 0 % (ref 0.0–0.2)

## 2019-12-07 LAB — I-STAT BETA HCG BLOOD, ED (MC, WL, AP ONLY): I-stat hCG, quantitative: <5 m[IU]/mL (ref <5)

## 2019-12-07 LAB — LIPASE, BLOOD: Lipase: 25 U/L (ref 11–51)

## 2019-12-07 MED ORDER — IOHEXOL 300 MG/ML  SOLN
100.0000 mL | Freq: Once | INTRAMUSCULAR | Status: AC | PRN
  Administered 2019-12-07: 100 mL via INTRAVENOUS

## 2019-12-07 MED ORDER — SODIUM CHLORIDE (PF) 0.9 % IJ SOLN
INTRAMUSCULAR | Status: AC
  Filled 2019-12-07: qty 50

## 2019-12-07 MED ORDER — GLYCERIN (LAXATIVE) 2.1 G RE SUPP
1.0000 | Freq: Once | RECTAL | Status: AC
  Administered 2019-12-07: 1 via RECTAL
  Filled 2019-12-07: qty 1

## 2019-12-07 NOTE — ED Triage Notes (Addendum)
Per EMS, patient from home, c/o abdominal pain x2 months worsening x2 days. Reports binge drinking daily. Denies vomiting.   Patient adds she has not taken psych meds in six months. Hx bipolar, depression.

## 2019-12-07 NOTE — Discharge Instructions (Addendum)
Take 1 capful of Miralax twice daily to stimulate bowel movement. Take Colace as needed as prescribed to help soften stool. Recheck with your doctor. If constipation continues, consider follow up with GI.

## 2019-12-07 NOTE — ED Provider Notes (Signed)
[facility] DEPT Provider Note   CSN: [id number] Arrival date & time: 12/07/19  1546     History Chief Complaint  Patient presents with  . Abdominal Pain    [name] is a 49 y.o. female.  49yo female brought in by EMS for abdominal pain x 3 months, worse for the past 2 days. Pain is described bloating, worse after eating. Patient states she is perimenopausal, LMP February, reports constipation and has to take home remedy to produce a bowel movement. Reports occasional red blood in her stools with straining. Denies vaginal discharge, changes in bladder habits, unintentional weight loss, night sweats. No other complaints or concerns.         Past Medical History:  Diagnosis Date  . Anxiety   . Depression   . Hepatitis   . Hypertension     Patient Active Problem List   Diagnosis Date Noted  . MDD (major depressive disorder), recurrent, severe, with psychosis ([employer]) 12/28/2018  . Cannabis use disorder, severe, dependence ([place])   . Bipolar affective disorder, depressed, severe, with psychotic behavior ([place]) 06/09/2014    Class: Acute  . Bipolar 1 disorder, depressed, severe ([place]) 06/09/2014  . Bipolar I disorder, most recent episode (or current) manic ([place]) 05/06/2013  . Polysubstance abuse ([place]) 05/06/2013  . BIPOLAR AFFECTIVE DISORDER 08/11/2010  . HEPATITIS B CARRIER 08/11/2010    History reviewed. No pertinent surgical history.   OB History    Gravida  4   Para  3   Term  3   Preterm      AB  1   Living  2     SAB  0   TAB      Ectopic  1   Multiple      Live Births  3           Family History  Problem Relation Age of Onset  . Depression Other     Social History   Tobacco Use  . Smoking status: Current Every Day Smoker    Packs/day: 0.50    Types: Cigarettes  . Smokeless tobacco: Never Used  Substance Use Topics  . Alcohol use: Yes    Comment: occ   . Drug use: Yes    Types: Marijuana, Cocaine      Comment: daily    Home Medications Prior to Admission medications   Medication Sig Start Date End Date Taking? Authorizing Provider  benztropine (COGENTIN) 0.5 MG tablet Take 1 tablet (0.5 mg total) by mouth 2 (two) times daily in the am and at bedtime.[name] 06/18/14   Trula Ore, NP  hydrOXYzine (ATARAX/VISTARIL) 25 MG tablet Take 1 tablet (25 mg total) by mouth 3 (three) times daily as needed for anxiety. 12/31/18   Johnn Hai, MD  lithium carbonate (ESKALITH) 450 MG CR tablet Take 1 tablet (450 mg total) by mouth at bedtime. 12/31/18   Johnn Hai, MD  meloxicam (MOBIC) 7.5 MG tablet Take 1 tablet (7.5 mg total) by mouth daily. 03/04/19   Carlisle Cater, PA-C  methocarbamol (ROBAXIN) 500 MG tablet Take 2 tablets (1,000 mg total) by mouth 4 (four) times daily. 03/04/19   Carlisle Cater, PA-C  metroNIDAZOLE (FLAGYL) 500 MG tablet Take 1 tablet (500 mg total) by mouth 2 (two) times daily. 08/16/19   Khatri, Hina, PA-C  OLANZapine (ZYPREXA) 20 MG tablet Take 1 tablet (20 mg total) by mouth at bedtime. 12/31/18   Johnn Hai, MD  prazosin (MINIPRESS) 2 MG capsule  Take 1 capsule (2 mg total) by mouth at bedtime. 12/31/18   Johnn Hai, MD  lurasidone (LATUDA) 80 MG TABS Take 80 mg by mouth daily with breakfast.    10/03/11  [provider]  sertraline (ZOLOFT) 50 MG tablet Take 50 mg by mouth daily.    10/03/11  [provider]    Allergies    Patient has no known allergies.  Review of Systems   Review of Systems  Constitutional: Negative for fever and unexpected weight change.  Respiratory: Negative for shortness of breath.   Cardiovascular: Negative for chest pain.  Gastrointestinal: Positive for abdominal distention, abdominal pain, blood in stool and constipation. Negative for diarrhea, nausea and vomiting.  Genitourinary: Negative for dysuria, flank pain, frequency, vaginal bleeding and vaginal discharge.  Musculoskeletal: Negative for arthralgias, back pain and  myalgias.  Skin: Negative for rash and wound.  Neurological: Negative for weakness.  Hematological: Negative for adenopathy.  All other systems reviewed and are negative.   Physical Exam Updated Vital Signs BP (!) 119/98 (BP Location: Right Arm)   Pulse 84   Temp 98.4 F (36.9 C) (Oral)   Resp 16   SpO2 100%   Physical Exam Vitals and nursing note reviewed.  Constitutional:      General: She is not in acute distress.    Appearance: She is well-developed. She is not diaphoretic.  HENT:     Head: Normocephalic and atraumatic.  Cardiovascular:     Rate and Rhythm: Normal rate and regular rhythm.     Heart sounds: Normal heart sounds.  Pulmonary:     Effort: Pulmonary effort is normal.     Breath sounds: Normal breath sounds.  Abdominal:     General: Bowel sounds are increased. There is distension.     Palpations: Abdomen is soft. There is fluid wave.     Tenderness: There is abdominal tenderness in the epigastric area and left upper quadrant. There is no right CVA tenderness or left CVA tenderness.  Skin:    General: Skin is warm and dry.     Findings: No erythema or rash.  Neurological:     Mental Status: She is alert and oriented to person, place, and time.  Psychiatric:        Behavior: Behavior normal.     ED Results / Procedures / Treatments   Labs (all labs ordered are listed, but only abnormal results are displayed) Labs Reviewed  CBC - Abnormal; Notable for the following components:      Result Value   MCV 105.0 (*)    MCH 36.0 (*)    All other components within normal limits  URINALYSIS, ROUTINE W REFLEX MICROSCOPIC - Abnormal; Notable for the following components:   APPearance HAZY (*)    Hgb urine dipstick SMALL (*)    Ketones, ur 5 (*)    Leukocytes,Ua MODERATE (*)    Bacteria, UA RARE (*)    All other components within normal limits  LIPASE, BLOOD  COMPREHENSIVE METABOLIC PANEL  I-STAT BETA HCG BLOOD, ED ([facility], [facility], [facility] ONLY)     EKG None  Radiology CT Abdomen Pelvis W Contrast  Result Date: 12/07/2019 CLINICAL DATA:  Acute abdominal pain for 2 days, history of binge drinking, initial encounter EXAM: CT ABDOMEN AND PELVIS WITH CONTRAST TECHNIQUE: Multidetector CT imaging of the abdomen and pelvis was performed using the standard protocol following bolus administration of intravenous contrast. CONTRAST:  145mL OMNIPAQUE IOHEXOL 300 MG/ML  SOLN COMPARISON:  None. FINDINGS:  Lower chest: No acute abnormality. Hepatobiliary: Mild fatty infiltration of the liver is noted. The gallbladder is within normal limits. Pancreas: Unremarkable. No pancreatic ductal dilatation or surrounding inflammatory changes. Spleen: Normal in size without focal abnormality. Adrenals/Urinary Tract: Adrenal glands are unremarkable. Kidneys demonstrate a normal enhancement pattern bilaterally. No renal calculi or obstructive changes are seen. Normal excretion of contrast is noted on delayed images. Small renal cysts are noted bilaterally. The bladder is decompressed. Stomach/Bowel: The appendix is within normal limits. No obstructive or inflammatory changes of the colon are seen. The small bowel is unremarkable. No gastric abnormality is seen. Vascular/Lymphatic: No significant vascular findings are present. No enlarged abdominal or pelvic lymph nodes. Reproductive: Uterus is within normal limits. 2.7 cm left ovarian cyst is noted. Other: No abdominal wall hernia or abnormality. No abdominopelvic ascites. Musculoskeletal: No acute or significant osseous findings. IMPRESSION: Left ovarian cyst measuring 2.7 cm. No acute abnormality noted. Electronically Signed   By: Inez Catalina M.D.   On: 12/07/2019 19:05    Procedures Procedures (including critical care time)  Medications Ordered in ED Medications  sodium chloride (PF) 0.9 % injection (has no administration in time range)  Glycerin (Adult) 2.1 g suppository 1 suppository (1 suppository Rectal Given  12/07/19 1740)  iohexol (OMNIPAQUE) 300 MG/ML solution 100 mL (100 mLs Intravenous Contrast Given 12/07/19 1844)    ED Course  I have reviewed the triage vital signs and the nursing notes.  Pertinent labs & imaging results that were available during my care of the patient were reviewed by me and considered in my medical decision making (see chart for details).  Clinical Course as of Dec 07 1918  Sat Dec 06, 3416  340 49 year old female with complaint of abdominal distention and constipation.  On exam found to have left upper quadrant tenderness. Labs reassuring including CBC and CMP, urinalysis appears contaminated.  hCG is negative, lipase within normal meds.  CT abdomen pelvis shows a small ovarian cyst.  Suspect patient's discomfort is coming from her constipation.  Recommend Colace and MiraLAX and follow-up with PCP.   [LM]    Clinical Course User Index [LM] Roque Lias   MDM Rules/Calculators/A&P                      Final Clinical Impression(s) / ED Diagnoses Final diagnoses:  Generalized abdominal pain  Constipation, unspecified constipation type    Rx / DC Orders ED Discharge Orders    None       Roque Lias 12/07/19 1920    Valarie Merino, MD 12/07/19 [phone]
